# Patient Record
Sex: Female | Born: 1990 | Race: Black or African American | Hispanic: No | Marital: Single | Smoking: Current every day smoker
Health system: Southern US, Community
[De-identification: ages and names within clinical notes are randomized; demographics above are authoritative.]

## PROBLEM LIST (undated history)

## (undated) ENCOUNTER — Inpatient Hospital Stay (HOSPITAL_COMMUNITY): Payer: Self-pay

## (undated) ENCOUNTER — Emergency Department (HOSPITAL_COMMUNITY): Discharge status: Absent without leave | Payer: Self-pay

## (undated) DIAGNOSIS — R51 Headache: Secondary | ICD-10-CM

## (undated) DIAGNOSIS — I10 Essential (primary) hypertension: Secondary | ICD-10-CM

## (undated) DIAGNOSIS — R87613 High grade squamous intraepithelial lesion on cytologic smear of cervix (HGSIL): Secondary | ICD-10-CM

## (undated) DIAGNOSIS — E669 Obesity, unspecified: Secondary | ICD-10-CM

## (undated) HISTORY — DX: High grade squamous intraepithelial lesion on cytologic smear of cervix (HGSIL): R87.613

## (undated) HISTORY — PX: OTHER SURGICAL HISTORY: SHX169

---
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
## 2008-02-07 ENCOUNTER — Emergency Department (HOSPITAL_COMMUNITY): Admission: EM | Admit: 2008-02-07 | Discharge: 2008-02-07 | Payer: Self-pay | Admitting: Emergency Medicine

## 2008-02-11 ENCOUNTER — Inpatient Hospital Stay (HOSPITAL_COMMUNITY): Admission: EM | Admit: 2008-02-11 | Discharge: 2008-02-16 | Payer: Self-pay | Admitting: Emergency Medicine

## 2008-02-22 ENCOUNTER — Ambulatory Visit: Payer: Self-pay | Admitting: Internal Medicine

## 2008-08-26 ENCOUNTER — Emergency Department (HOSPITAL_COMMUNITY): Admission: EM | Admit: 2008-08-26 | Discharge: 2008-08-26 | Payer: Self-pay | Admitting: Emergency Medicine

## 2008-11-13 ENCOUNTER — Inpatient Hospital Stay (HOSPITAL_COMMUNITY): Admission: AD | Admit: 2008-11-13 | Discharge: 2008-11-13 | Payer: Self-pay | Admitting: Obstetrics

## 2009-01-08 ENCOUNTER — Ambulatory Visit (HOSPITAL_COMMUNITY): Admission: RE | Admit: 2009-01-08 | Discharge: 2009-01-08 | Payer: Self-pay | Admitting: Obstetrics

## 2009-01-25 ENCOUNTER — Ambulatory Visit: Payer: Self-pay | Admitting: Family

## 2009-01-25 ENCOUNTER — Inpatient Hospital Stay (HOSPITAL_COMMUNITY): Admission: AD | Admit: 2009-01-25 | Discharge: 2009-01-25 | Payer: Self-pay | Admitting: Obstetrics

## 2009-03-06 ENCOUNTER — Inpatient Hospital Stay (HOSPITAL_COMMUNITY): Admission: AD | Admit: 2009-03-06 | Discharge: 2009-03-06 | Payer: Self-pay | Admitting: Obstetrics

## 2009-04-11 ENCOUNTER — Inpatient Hospital Stay (HOSPITAL_COMMUNITY): Admission: AD | Admit: 2009-04-11 | Discharge: 2009-04-11 | Payer: Self-pay | Admitting: Obstetrics

## 2009-04-17 ENCOUNTER — Inpatient Hospital Stay (HOSPITAL_COMMUNITY): Admission: AD | Admit: 2009-04-17 | Discharge: 2009-04-17 | Payer: Self-pay | Admitting: Obstetrics & Gynecology

## 2009-06-09 ENCOUNTER — Inpatient Hospital Stay (HOSPITAL_COMMUNITY): Admission: AD | Admit: 2009-06-09 | Discharge: 2009-06-09 | Payer: Self-pay | Admitting: Obstetrics & Gynecology

## 2009-06-10 ENCOUNTER — Inpatient Hospital Stay (HOSPITAL_COMMUNITY): Admission: AD | Admit: 2009-06-10 | Discharge: 2009-06-14 | Payer: Self-pay | Admitting: Obstetrics

## 2009-06-21 ENCOUNTER — Emergency Department (HOSPITAL_COMMUNITY): Admission: EM | Admit: 2009-06-21 | Discharge: 2009-06-21 | Payer: Self-pay | Admitting: Emergency Medicine

## 2009-06-27 ENCOUNTER — Emergency Department (HOSPITAL_COMMUNITY): Admission: EM | Admit: 2009-06-27 | Discharge: 2009-06-28 | Payer: Self-pay | Admitting: Emergency Medicine

## 2009-11-13 ENCOUNTER — Emergency Department (HOSPITAL_COMMUNITY): Admission: EM | Admit: 2009-11-13 | Discharge: 2009-11-13 | Payer: Self-pay | Admitting: Emergency Medicine

## 2009-11-26 ENCOUNTER — Emergency Department (HOSPITAL_COMMUNITY): Admission: EM | Admit: 2009-11-26 | Discharge: 2009-11-26 | Payer: Self-pay | Admitting: Emergency Medicine

## 2010-02-08 ENCOUNTER — Emergency Department (HOSPITAL_COMMUNITY): Admission: EM | Admit: 2010-02-08 | Discharge: 2010-02-09 | Payer: Self-pay | Admitting: Emergency Medicine

## 2010-04-05 ENCOUNTER — Emergency Department (HOSPITAL_COMMUNITY): Admission: EM | Admit: 2010-04-05 | Discharge: 2010-04-05 | Payer: Self-pay | Admitting: Emergency Medicine

## 2010-04-08 ENCOUNTER — Emergency Department (HOSPITAL_COMMUNITY)
Admission: EM | Admit: 2010-04-08 | Discharge: 2010-04-08 | Payer: Self-pay | Source: Home / Self Care | Admitting: Emergency Medicine

## 2010-07-26 LAB — RAPID STREP SCREEN (MED CTR MEBANE ONLY): Streptococcus, Group A Screen (Direct): NEGATIVE

## 2010-07-27 LAB — URINALYSIS, ROUTINE W REFLEX MICROSCOPIC
Glucose, UA: NEGATIVE mg/dL
Ketones, ur: NEGATIVE mg/dL
Nitrite: NEGATIVE
Specific Gravity, Urine: 1.023 (ref 1.005–1.030)
pH: 7.5 (ref 5.0–8.0)

## 2010-07-27 LAB — CBC
HCT: 36.6 % (ref 36.0–46.0)
Hemoglobin: 12.4 g/dL (ref 12.0–15.0)
MCV: 86.9 fL (ref 78.0–100.0)
RDW: 13.8 % (ref 11.5–15.5)

## 2010-07-27 LAB — GC/CHLAMYDIA PROBE AMP, GENITAL
Chlamydia, DNA Probe: NEGATIVE
GC Probe Amp, Genital: NEGATIVE

## 2010-07-27 LAB — WET PREP, GENITAL: Yeast Wet Prep HPF POC: NONE SEEN

## 2010-07-27 LAB — POCT PREGNANCY, URINE: Preg Test, Ur: NEGATIVE

## 2010-07-30 LAB — CBC
Platelets: 154 10*3/uL (ref 150–400)
WBC: 18.9 10*3/uL — ABNORMAL HIGH (ref 4.0–10.5)

## 2010-07-30 LAB — URINE MICROSCOPIC-ADD ON

## 2010-07-30 LAB — POCT I-STAT, CHEM 8
Calcium, Ion: 1.08 mmol/L — ABNORMAL LOW (ref 1.12–1.32)
HCT: 35 % — ABNORMAL LOW (ref 36.0–46.0)
Sodium: 138 mEq/L (ref 135–145)
TCO2: 27 mmol/L (ref 0–100)

## 2010-07-30 LAB — URINALYSIS, ROUTINE W REFLEX MICROSCOPIC
Bilirubin Urine: NEGATIVE
Glucose, UA: NEGATIVE mg/dL
Protein, ur: NEGATIVE mg/dL

## 2010-08-12 LAB — URINALYSIS, ROUTINE W REFLEX MICROSCOPIC
Glucose, UA: NEGATIVE mg/dL
Nitrite: NEGATIVE
Protein, ur: NEGATIVE mg/dL
Specific Gravity, Urine: 1.02 (ref 1.005–1.030)
Specific Gravity, Urine: 1.025 (ref 1.005–1.030)
pH: 6 (ref 5.0–8.0)
pH: 7 (ref 5.0–8.0)

## 2010-08-12 LAB — URINE MICROSCOPIC-ADD ON

## 2010-08-12 LAB — WET PREP, GENITAL
Trich, Wet Prep: NONE SEEN
Yeast Wet Prep HPF POC: NONE SEEN

## 2010-08-14 LAB — URINALYSIS, ROUTINE W REFLEX MICROSCOPIC
Bilirubin Urine: NEGATIVE
Ketones, ur: NEGATIVE mg/dL
Nitrite: NEGATIVE
Protein, ur: NEGATIVE mg/dL
Specific Gravity, Urine: 1.025 (ref 1.005–1.030)
Urobilinogen, UA: 0.2 mg/dL (ref 0.0–1.0)

## 2010-08-14 LAB — DIFFERENTIAL
Basophils Absolute: 0.1 10*3/uL (ref 0.0–0.1)
Basophils Relative: 1 % (ref 0–1)
Eosinophils Relative: 1 % (ref 0–5)
Monocytes Absolute: 1.4 10*3/uL — ABNORMAL HIGH (ref 0.1–1.0)
Neutro Abs: 10.5 10*3/uL — ABNORMAL HIGH (ref 1.7–7.7)

## 2010-08-14 LAB — CBC
Hemoglobin: 11.9 g/dL — ABNORMAL LOW (ref 12.0–15.0)
MCHC: 33.5 g/dL (ref 30.0–36.0)
RDW: 13.3 % (ref 11.5–15.5)

## 2010-08-17 LAB — CBC
Hemoglobin: 12.9 g/dL (ref 12.0–15.0)
MCHC: 35.1 g/dL (ref 30.0–36.0)
MCV: 87 fL (ref 78.0–100.0)
RDW: 13.4 % (ref 11.5–15.5)

## 2010-08-17 LAB — URINALYSIS, ROUTINE W REFLEX MICROSCOPIC
Bilirubin Urine: NEGATIVE
Glucose, UA: NEGATIVE mg/dL
Ketones, ur: NEGATIVE mg/dL
Leukocytes, UA: NEGATIVE
Nitrite: NEGATIVE
Protein, ur: NEGATIVE mg/dL
Specific Gravity, Urine: 1.025 (ref 1.005–1.030)
Urobilinogen, UA: 0.2 mg/dL (ref 0.0–1.0)
pH: 6 (ref 5.0–8.0)

## 2010-08-17 LAB — WET PREP, GENITAL
Trich, Wet Prep: NONE SEEN
Yeast Wet Prep HPF POC: NONE SEEN

## 2010-08-17 LAB — GC/CHLAMYDIA PROBE AMP, GENITAL: Chlamydia, DNA Probe: NEGATIVE

## 2010-08-20 LAB — POCT URINALYSIS DIP (DEVICE)
Bilirubin Urine: NEGATIVE
Glucose, UA: NEGATIVE mg/dL
Ketones, ur: NEGATIVE mg/dL
pH: 7.5 (ref 5.0–8.0)

## 2010-09-17 ENCOUNTER — Emergency Department (HOSPITAL_COMMUNITY)
Admission: EM | Admit: 2010-09-17 | Discharge: 2010-09-18 | Disposition: A | Discharge status: Home / Self Care | Payer: Self-pay | Attending: Emergency Medicine | Admitting: Emergency Medicine

## 2010-09-17 DIAGNOSIS — R51 Headache: Secondary | ICD-10-CM | POA: Insufficient documentation

## 2010-09-17 DIAGNOSIS — H538 Other visual disturbances: Secondary | ICD-10-CM | POA: Insufficient documentation

## 2010-09-17 DIAGNOSIS — Y929 Unspecified place or not applicable: Secondary | ICD-10-CM | POA: Insufficient documentation

## 2010-09-17 DIAGNOSIS — IMO0002 Reserved for concepts with insufficient information to code with codable children: Secondary | ICD-10-CM | POA: Insufficient documentation

## 2010-09-17 DIAGNOSIS — S060X0A Concussion without loss of consciousness, initial encounter: Secondary | ICD-10-CM | POA: Insufficient documentation

## 2010-09-17 DIAGNOSIS — S0003XA Contusion of scalp, initial encounter: Secondary | ICD-10-CM | POA: Insufficient documentation

## 2010-09-23 NOTE — H&P (Signed)
NAMEGUILLERMO, Susan English                ACCOUNT NO.:  [id number]   MEDICAL RECORD NO.:  [id number]          PATIENT TYPE:  INP   LOCATION:  5114                         FACILITY:  [facility]   PHYSICIAN:  Jennelle Human. Marisue Humble, MD DATE OF BIRTH:  [birth year]   DATE OF ADMISSION:  02/11/2008  DATE OF DISCHARGE:                              HISTORY & PHYSICAL   PRIMARY CARE PHYSICIAN:  None.   CHIEF COMPLAINT:  Nausea, vomiting, and diarrhea.   HISTORY OF PRESENT ILLNESS:  This is a 20 year old lady who started to  have a sore throat approximately 10-11 days ago.  This was followed by  diffuse body pains, headache, and sharp pleuritic chest pains with deep  breaths.  This was soon followed by nausea and vomiting that was also  associated with diarrhea, which has been present for about 10 days now.  She has started to get fever and chills and feels as the progression of  her symptoms has worsened.  She went to an urgent care facility on  Tuesday and was given some meclizine and diagnosed with gastroenteritis.  Since last Tuesday, her appetite has decreased further.  She has had  more dizziness, especially upon standing.  She has been unable to keep  soup down without significant vomiting and is now to the point where she  cannot keep anything down.   PAST MEDICAL HISTORY:  None.   PAST SURGICAL HISTORY:  None.   ALLERGIES:  No known drug allergies.   MEDICATIONS:  None.   REVIEW OF SYSTEMS:  Negative x10 except for that stated in the HPI.   SOCIAL HISTORY:  She has currently a Consulting civil engineer at [employer], [place] [employer].  She  lives with her mom.  She does smoke tobacco, but no ethanol or other  illicit drug use.   FAMILY HISTORY:  Significant for congestive heart failure in the older  members of the family, late onset adult type 2 diabetes mellitus, and  hypertension.  There is no history of early heart disease and her  sibling is healthy.   PHYSICAL EXAMINATION:  VITAL SIGNS:  Her temperature was 98.5  degrees.  Her blood pressure 102/68 with a heart rate of 119.  She is satting 100%  on room air.  HEENT:  Tacky mucous membranes, but is otherwise normal.  NECK:  Supple without lymphadenopathy or masses.  There sunken jugular  venous pressures.  CHEST:  There are bilateral basilar wheezes and crackles.  HEART:  Regular rate and rhythm, although tachy without murmurs,  gallops, or rubs.  ABDOMEN:  Soft, tender in the epigastrium.  No rebounding or guarding is  noted.  Bowel sounds are hyperactive.  There is no CVA tenderness noted.  EXTREMITIES:  No clubbing, cyanosis, or edema is noted.  She has 2+  pulses.  Her capillary refill is delayed at approximately 3-4 seconds.   Chest x-ray shows bilateral lower lobe infiltrates.   LABORATORY DATA:  White count of 25.6 with a left shift.  Sodium is 138,  potassium is 3.6, chloride is 106, BUN and creatinine is 08 and 1  respectively.  Glucose is 83.  Urinalysis shows many epithelial cells,  which is consistent with a contaminated specimen and she is currently on  her period.  White blood cells 3-6, red blood cells 7-10, leukocyte  esterase is positive, and nitrite is positive.  Urine pregnancy test is  negative.   ASSESSMENT/PLAN:  1. Pneumonia.  This is likely postviral in nature.  She likely had a      viral illness and now has a secondary bacterial infection.  Chest x-      ray shows bilateral lower lobe infiltrates.  Blood cultures x2 have      been drawn and IV fluids started.  She is being treated for      community-acquired pneumonia and has received 1 g of Rocephin IV      and 500 mg of azithromycin IV.  She does have some wheezing and      therefore we will give her p.r.n. albuterol treatments.  2. Nausea - vomiting - diarrhea.  We have given IV fluids.  She was      significantly dehydrated when she came in.  We will treat her      nausea and vomiting with Phenergan as needed and will start her on      a full liquid diet and  advance as tolerated.  3. Dehydration with tachycardia and orthostasis.  We will treat with      copious IV fluids.  She appears significantly dehydrated by exam as      her mucous membranes are tacky and her capillary refill is 3-4      seconds with a heart rate in the 120s-130s on arrival.  Her lab      exam was not too impressive for dehydration with a BUN to      creatinine ratio of only 8.  However, her physical exam was pretty      remarkable.  We will treat underlying infectious etiology and      continue to hydrate.  4. Possible urinary tract infection.  Although it is a contaminated      specimen and she is on her period and has multiple epithelial cells      and there is leukocyte esterase, bacteria, and nitrites.  Thus, she      will be covered for urinary tract infection with the aforementioned      antibiotics.  5. Tobacco abuse.  There was approximately 5 minutes spent on smoking      cessation.  As she is young, this would be easier for her to quit      now before she has many years of addiction.      Jennelle Human Marisue Humble, MD  Electronically Signed     GBS/MEDQ  D:  02/11/2008  T:  02/11/2008  Job:  [id number]

## 2010-09-23 NOTE — Discharge Summary (Signed)
NAMEMONICIA, TSE                ACCOUNT NO.:  [id number]   MEDICAL RECORD NO.:  [id number]          PATIENT TYPE:  INP   LOCATION:  5114                         FACILITY:  [facility]   PHYSICIAN:  Herbie Saxon, MDDATE OF BIRTH:  06/22/1990   DATE OF ADMISSION:  02/11/2008  DATE OF DISCHARGE:  02/16/2008                               DISCHARGE SUMMARY   DISCHARGE DIAGNOSES:  1. Bilateral recent pneumonia, clinically improving.  2. Urinary tract infection, improved.  3. Dehydration, resolved.  4. Thrombocytopenia, resolved.  5. Hypokalemia, repleted.  6. Tobacco abuse.   RADIOLOGY:  The chest x-ray on admission showed bilateral infiltrates.  Repeat chest x-ray on February 14, 2008, showed the bilateral patchy lower  lobe infiltrate again without significant change.  No pleural effusion.   HOSPITAL COURSE:  This 20 year old African American lady presented with  sore throat of 10 days' duration, associated nausea and vomiting and  rare fevers and chills and anorexia.  At presentation, the patient was  dehydrated, orthostatic tachycardic, and had hypokalemia which was  improved with IV fluid hydration.  The patient was started on IV  Rocephin and Zithromax with p.r.n. bronchodilator treatment.  A  leukocyte of 25,000 at presentation, this is resolved.  Also, noticed to  be having thrombocytopenia likely secondary to the sepsis, but this is  also resolved.  The patient is clinically improved and has been afebrile  in the last 48 hours.  She has been consulted on the need to totally  freeze smoking cigarettes.   DISCHARGE CONDITION:  Stable.   DIET:  Regular.   ACTIVITY:  To be increased slowly as tolerated.   FOLLOWUP:  The patient is to follow up at [facility] in the next 5-7  days as she is currently has no medical insurance, have repeat chest x-  ray in 1 week.   DISCHARGE MEDICATIONS:  1. Levaquin 500 mg daily for 1 week.  2. Robitussin 10 mL q.6 h. p.r.n.  3.  Ibuprofen 400 mg q.8 h. p.r.n.  4. Tylenol 650 mg q.6 h. p.r.n. for fever.  5. Chloraseptic 1 spray twice daily.   PHYSICAL EXAMINATION:  GENERAL:  She is a young lady not in acute  distress  Vital Signs stable    Dictation ended at this point.      Herbie Saxon, MD  Electronically Signed     MIO/MEDQ  D:  02/16/2008  T:  02/16/2008  Job:  [id number]

## 2010-09-26 NOTE — Op Note (Signed)
Susan English, Susan English                ACCOUNT NO.:  [id number]   MEDICAL RECORD NO.:  [id number]          PATIENT TYPE:  AMB   LOCATION:  DSC                          FACILITY:  [facility]   PHYSICIAN:  Earvin Hansen L. Shon Hough, M.D.DATE OF BIRTH:  10-Sep-1990   DATE OF PROCEDURE:  DATE OF DISCHARGE:                               OPERATIVE REPORT   HISTORY:  A 20 year old with severe hidradenitis suppurativa involving  the right axillary area.  Increased pain, discomfort.  She has had  increased I&Ds of the areas in the past.  We are having a quiet period  now and she is now being prepared for excision of the hidradenitis  suppurative with a Ryan-Pollock closure.   ANESTHESIA:  General.   Preoperatively, the patient was stood up and drawn for all hair bearing  area involving the right axillary area in elliptical fashion, within the  axillary crease lines.  She then, underwent general anesthesia,  intubated orally.  Prep was done to the right arm areas, axillary breast  areas with Hibiclens soap and solution and walled off with sterile  towels and draped so as to make a sterile field.  1/2% Xylocaine with  epinephrine 1:200,000 concentration was injected locally, a total of 50  mL after this and awaiting for vasoconstriction to take place while the  incision is made hidradenitis down to the superficial fascia.  After  appropriate hemostasis using the Bovie anticoagulation the flaps are  freed out approximately 3 cm for each side.  They were then brought  together in a rotational fashion and with 2-0 Monocryl x 2 layers  attaching to the deep fascia.  Subcutaneous close was done with 3-0  Monocryl, subdermal 5-0 Monocryl, then a running subcuticular of 3-0  Monocryl.  Flaps came back together very nicely.  They were cleaned.  Steri-Strips and soft dressing applied using 1/2-inch Steri-Strips,  xeroform gauze, 4 X 4's, ABD's and hyperfixed tape.  She went to  recovery in excellent condition.   ESTIMATED BLOOD LOSS:  Less than 50 mL.   COMPLICATIONS:  None.      Yaakov Guthrie. Shon Hough, M.D.  Electronically Signed     GLT/MEDQ  D:  03/15/2006  T:  03/15/2006  Job:  [id number]

## 2010-11-29 ENCOUNTER — Emergency Department (HOSPITAL_COMMUNITY)
Admission: EM | Admit: 2010-11-29 | Discharge: 2010-11-29 | Disposition: A | Discharge status: Home / Self Care | Payer: Medicaid Other | Attending: Emergency Medicine | Admitting: Emergency Medicine

## 2010-11-29 DIAGNOSIS — S335XXA Sprain of ligaments of lumbar spine, initial encounter: Secondary | ICD-10-CM | POA: Insufficient documentation

## 2010-11-29 DIAGNOSIS — M545 Low back pain, unspecified: Secondary | ICD-10-CM | POA: Insufficient documentation

## 2010-11-29 DIAGNOSIS — A499 Bacterial infection, unspecified: Secondary | ICD-10-CM | POA: Insufficient documentation

## 2010-11-29 DIAGNOSIS — M25579 Pain in unspecified ankle and joints of unspecified foot: Secondary | ICD-10-CM | POA: Insufficient documentation

## 2010-11-29 DIAGNOSIS — X58XXXA Exposure to other specified factors, initial encounter: Secondary | ICD-10-CM | POA: Insufficient documentation

## 2010-11-29 DIAGNOSIS — E669 Obesity, unspecified: Secondary | ICD-10-CM | POA: Insufficient documentation

## 2010-11-29 DIAGNOSIS — B9689 Other specified bacterial agents as the cause of diseases classified elsewhere: Secondary | ICD-10-CM | POA: Insufficient documentation

## 2010-11-29 DIAGNOSIS — N760 Acute vaginitis: Secondary | ICD-10-CM | POA: Insufficient documentation

## 2010-11-29 LAB — WET PREP, GENITAL: Trich, Wet Prep: NONE SEEN

## 2010-11-29 LAB — URINE MICROSCOPIC-ADD ON

## 2010-11-29 LAB — URINALYSIS, ROUTINE W REFLEX MICROSCOPIC
Glucose, UA: NEGATIVE mg/dL
Ketones, ur: NEGATIVE mg/dL
Leukocytes, UA: NEGATIVE
Nitrite: NEGATIVE
Protein, ur: NEGATIVE mg/dL
Urobilinogen, UA: 1 mg/dL (ref 0.0–1.0)

## 2010-11-29 LAB — POCT PREGNANCY, URINE: Preg Test, Ur: NEGATIVE

## 2010-12-01 LAB — GC/CHLAMYDIA PROBE AMP, GENITAL
Chlamydia, DNA Probe: NEGATIVE
GC Probe Amp, Genital: NEGATIVE

## 2011-01-03 ENCOUNTER — Emergency Department (HOSPITAL_COMMUNITY)
Admission: EM | Admit: 2011-01-03 | Discharge: 2011-01-04 | Disposition: A | Discharge status: Home / Self Care | Payer: Medicaid Other | Attending: Emergency Medicine | Admitting: Emergency Medicine

## 2011-01-03 DIAGNOSIS — E669 Obesity, unspecified: Secondary | ICD-10-CM | POA: Insufficient documentation

## 2011-01-03 DIAGNOSIS — R0789 Other chest pain: Secondary | ICD-10-CM | POA: Insufficient documentation

## 2011-01-03 DIAGNOSIS — M546 Pain in thoracic spine: Secondary | ICD-10-CM | POA: Insufficient documentation

## 2011-01-04 ENCOUNTER — Emergency Department (HOSPITAL_COMMUNITY): Payer: Medicaid Other

## 2011-01-04 LAB — DIFFERENTIAL
Basophils Absolute: 0.1 10*3/uL (ref 0.0–0.1)
Basophils Relative: 1 % (ref 0–1)
Eosinophils Absolute: 0.3 10*3/uL (ref 0.0–0.7)
Monocytes Relative: 15 % — ABNORMAL HIGH (ref 3–12)
Neutro Abs: 5 10*3/uL (ref 1.7–7.7)
Neutrophils Relative %: 47 % (ref 43–77)

## 2011-01-04 LAB — CBC
Hemoglobin: 12 g/dL (ref 12.0–15.0)
MCH: 27.6 pg (ref 26.0–34.0)
Platelets: 295 10*3/uL (ref 150–400)
RBC: 4.35 MIL/uL (ref 3.87–5.11)

## 2011-01-04 LAB — POCT I-STAT, CHEM 8
Calcium, Ion: 1.25 mmol/L (ref 1.12–1.32)
HCT: 39 % (ref 36.0–46.0)
Hemoglobin: 13.3 g/dL (ref 12.0–15.0)
Sodium: 140 mEq/L (ref 135–145)
TCO2: 24 mmol/L (ref 0–100)

## 2011-01-04 LAB — D-DIMER, QUANTITATIVE: D-Dimer, Quant: 0.39 ug/mL-FEU (ref 0.00–0.48)

## 2011-02-02 LAB — POCT PREGNANCY, URINE: Preg Test, Ur: NEGATIVE

## 2011-02-06 ENCOUNTER — Inpatient Hospital Stay (INDEPENDENT_AMBULATORY_CARE_PROVIDER_SITE_OTHER)
Admission: RE | Admit: 2011-02-06 | Discharge: 2011-02-06 | Disposition: A | Discharge status: Home / Self Care | Payer: Medicaid Other | Source: Ambulatory Visit | Attending: Family Medicine | Admitting: Family Medicine

## 2011-02-06 DIAGNOSIS — L210 Seborrhea capitis: Secondary | ICD-10-CM

## 2011-02-06 DIAGNOSIS — B354 Tinea corporis: Secondary | ICD-10-CM

## 2011-02-06 DIAGNOSIS — L259 Unspecified contact dermatitis, unspecified cause: Secondary | ICD-10-CM

## 2011-02-09 LAB — COMPREHENSIVE METABOLIC PANEL
ALT: 14
AST: 16
Albumin: 2.5 — ABNORMAL LOW
Alkaline Phosphatase: 83
Calcium: 7.7 — ABNORMAL LOW
GFR calc Af Amer: >60
Glucose, Bld: 103 — ABNORMAL HIGH
Potassium: 3.3 — ABNORMAL LOW
Sodium: 138
Total Protein: 6.1

## 2011-02-09 LAB — POCT I-STAT, CHEM 8
BUN: 8
Calcium, Ion: 1.14
Chloride: 106
Creatinine, Ser: 1
Glucose, Bld: 83
HCT: 44
Hemoglobin: 15
Potassium: 3.6
Sodium: 138
TCO2: 21

## 2011-02-09 LAB — URINE MICROSCOPIC-ADD ON

## 2011-02-09 LAB — URINALYSIS, ROUTINE W REFLEX MICROSCOPIC
Glucose, UA: NEGATIVE
Ketones, ur: 15 — AB
Protein, ur: >300 — AB
pH: 6

## 2011-02-09 LAB — DIFFERENTIAL
Basophils Absolute: 0
Lymphs Abs: 1.5
Monocytes Absolute: 1.8 — ABNORMAL HIGH
WBC Morphology: INCREASED

## 2011-02-09 LAB — CULTURE, BLOOD (ROUTINE X 2): Culture: NO GROWTH

## 2011-02-09 LAB — BASIC METABOLIC PANEL
BUN: 8
CO2: 23
Chloride: 114 — ABNORMAL HIGH
Glucose, Bld: 107 — ABNORMAL HIGH
Potassium: 3.6
Sodium: 142

## 2011-02-09 LAB — CBC
HCT: 32.7 — ABNORMAL LOW
Hemoglobin: 10.6 — ABNORMAL LOW
Hemoglobin: 11.1 — ABNORMAL LOW
MCV: 87.1
Platelets: 143 — ABNORMAL LOW
Platelets: 159
RBC: 4.96
RDW: 13.8
RDW: 14.1
WBC: 25.6 — ABNORMAL HIGH

## 2011-02-09 LAB — MAGNESIUM: Magnesium: 1.6

## 2011-02-09 LAB — PREGNANCY, URINE: Preg Test, Ur: NEGATIVE

## 2011-02-09 LAB — POCT PREGNANCY, URINE: Preg Test, Ur: NEGATIVE

## 2011-04-03 ENCOUNTER — Encounter: Payer: Self-pay | Admitting: Emergency Medicine

## 2011-04-03 ENCOUNTER — Emergency Department (INDEPENDENT_AMBULATORY_CARE_PROVIDER_SITE_OTHER)
Admission: EM | Admit: 2011-04-03 | Discharge: 2011-04-03 | Disposition: A | Discharge status: Home / Self Care | Payer: Medicaid Other | Source: Home / Self Care | Attending: Emergency Medicine | Admitting: Emergency Medicine

## 2011-04-03 DIAGNOSIS — L309 Dermatitis, unspecified: Secondary | ICD-10-CM

## 2011-04-03 DIAGNOSIS — L259 Unspecified contact dermatitis, unspecified cause: Secondary | ICD-10-CM

## 2011-04-03 MED ORDER — HYDROXYZINE HCL 25 MG PO TABS: 25.0000 mg | ORAL_TABLET | Freq: Four times a day (QID) | ORAL | Status: AC

## 2011-04-03 MED ORDER — TRIAMCINOLONE ACETONIDE 0.1 % EX OINT: TOPICAL_OINTMENT | Freq: Two times a day (BID) | CUTANEOUS | Status: DC

## 2011-04-03 MED ORDER — PREDNISONE 10 MG PO TABS: ORAL_TABLET | ORAL | Status: AC

## 2011-04-03 NOTE — ED Provider Notes (Signed)
History     CSN: [id number] Arrival date & time: 04/03/2011  8:56 PM   First MD Initiated Contact with Patient 04/03/11 2035      Chief Complaint  Patient presents with  . Rash    (Consider location/radiation/quality/duration/timing/severity/associated sxs/prior treatment) HPI Comments: Susan English has had a 4 to six-month history of a moderately pruritic generalized rash which has occurred on her arms, abdomen, legs, back, and face. She was here before in September and given steroids, cream, and antihistamine. The rash has not gotten any better and in fact has gotten worse. She's not been exposed to any thing or anyone with a similar rash. There are no specific contactants. 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 She denies any history of asthma, eczema, or allergic rhinitis.  Patient is a 20 y.o. female presenting with rash.  Rash     History reviewed. No pertinent past medical history.  Past Surgical History  Procedure Date  . Cesarean section   . Sweat gland     History reviewed. No pertinent family history.  History  Substance Use Topics  . Smoking status: Current Some Day Smoker  . Smokeless tobacco: Not on file  . Alcohol Use: No    OB History    Grav Para Term Preterm Abortions TAB SAB Ect Mult Living                  Review of Systems  Constitutional: Negative for fever and chills.  Skin: Positive for rash. Negative for color change, pallor and wound.    Allergies  Review of patient's allergies indicates no known allergies.  Home Medications   Current Outpatient Rx  Name Route Sig Dispense Refill  . IMPLANON [place] Subcutaneous Inject into the skin.      Marland Kitchen HYDROXYZINE HCL 25 MG PO TABS Oral Take 1 tablet (25 mg total) by mouth every 6 (six) hours. 60 tablet 0  . PREDNISONE 10 MG PO TABS  Take 4 tabs daily for 4 days, 3 tabs daily for 4 days, 2 tabs daily for 4 days, then 1 tab daily for 4 days.  Take all tabs at one time with food and  preferably in the morning except for the first dose. 40 tablet 0  . TRIAMCINOLONE ACETONIDE 0.1 % EX OINT Topical Apply topically 2 (two) times daily. 454 g 0    BP 121/69  Pulse 78  Temp(Src) 98.4 F (36.9 C) (Oral)  Resp 16  SpO2 100%  LMP 04/03/2011  Physical Exam  Nursing note and vitals reviewed. Constitutional: She appears well-developed and well-nourished. No distress.  Skin: Skin is warm and dry. Rash noted. No abrasion, no bruising, no ecchymosis and no lesion noted. She is not diaphoretic. No erythema. No pallor.       Exam reveals typical eczema rash on arms, antecubital fossas, lower back, abdomen, and popliteal fossas. She has hyperpigmentation, skin thickening, and lichenification.    ED Course  Procedures (including critical care time)  Labs Reviewed - No data to display No results found.   1. Eczema       MDM  She has a typical eczema rash. She'll need followup with a dermatologist. In the meantime she was given a cream, a by mouth steroid taper, and hydroxyzine for itching. She was given general instructions for skin care as well.        Roque Lias, MD 04/03/11 [phone]

## 2011-04-03 NOTE — ED Notes (Signed)
C/o rash for months.  Seen at [facility] in the past for the same.  Rash did go away, but came back.  Patient has patches on extremeties: blisters, very itchy,then dry scaly patches.  Patient reports it is on lower back as well.

## 2011-04-22 ENCOUNTER — Encounter (HOSPITAL_COMMUNITY): Payer: Self-pay | Admitting: *Deleted

## 2011-04-22 ENCOUNTER — Emergency Department (HOSPITAL_COMMUNITY): Payer: Medicaid Other

## 2011-04-22 ENCOUNTER — Emergency Department (HOSPITAL_COMMUNITY)
Admission: EM | Admit: 2011-04-22 | Discharge: 2011-04-22 | Disposition: A | Discharge status: Home / Self Care | Payer: Medicaid Other | Attending: Emergency Medicine | Admitting: Emergency Medicine

## 2011-04-22 DIAGNOSIS — J40 Bronchitis, not specified as acute or chronic: Secondary | ICD-10-CM | POA: Insufficient documentation

## 2011-04-22 DIAGNOSIS — R197 Diarrhea, unspecified: Secondary | ICD-10-CM | POA: Insufficient documentation

## 2011-04-22 DIAGNOSIS — J3489 Other specified disorders of nose and nasal sinuses: Secondary | ICD-10-CM | POA: Insufficient documentation

## 2011-04-22 DIAGNOSIS — R112 Nausea with vomiting, unspecified: Secondary | ICD-10-CM | POA: Insufficient documentation

## 2011-04-22 DIAGNOSIS — R072 Precordial pain: Secondary | ICD-10-CM | POA: Insufficient documentation

## 2011-04-22 DIAGNOSIS — R059 Cough, unspecified: Secondary | ICD-10-CM | POA: Insufficient documentation

## 2011-04-22 DIAGNOSIS — R509 Fever, unspecified: Secondary | ICD-10-CM | POA: Insufficient documentation

## 2011-04-22 DIAGNOSIS — R42 Dizziness and giddiness: Secondary | ICD-10-CM | POA: Insufficient documentation

## 2011-04-22 DIAGNOSIS — R05 Cough: Secondary | ICD-10-CM | POA: Insufficient documentation

## 2011-04-22 DIAGNOSIS — IMO0001 Reserved for inherently not codable concepts without codable children: Secondary | ICD-10-CM | POA: Insufficient documentation

## 2011-04-22 HISTORY — DX: Obesity, unspecified: E66.9

## 2011-04-22 MED ORDER — TRAMADOL HCL 50 MG PO TABS: 50.0000 mg | ORAL_TABLET | Freq: Four times a day (QID) | ORAL | Status: AC | PRN

## 2011-04-22 MED ORDER — ALBUTEROL SULFATE HFA 108 (90 BASE) MCG/ACT IN AERS: 1.0000 | INHALATION_SPRAY | Freq: Four times a day (QID) | RESPIRATORY_TRACT | Status: DC | PRN

## 2011-04-22 MED ORDER — KETOROLAC TROMETHAMINE 30 MG/ML IJ SOLN
30.0000 mg | Freq: Once | INTRAMUSCULAR | Status: AC
  Administered 2011-04-22: 30 mg via INTRAVENOUS
  Filled 2011-04-22: qty 1

## 2011-04-22 MED ORDER — GI COCKTAIL ~~LOC~~
30.0000 mL | Freq: Once | ORAL | Status: AC
  Administered 2011-04-22: 30 mL via ORAL
  Filled 2011-04-22: qty 30

## 2011-04-22 MED ORDER — METHYLPREDNISOLONE SODIUM SUCC 125 MG IJ SOLR
125.0000 mg | Freq: Once | INTRAMUSCULAR | Status: AC
  Administered 2011-04-22: 125 mg via INTRAVENOUS
  Filled 2011-04-22: qty 2

## 2011-04-22 MED ORDER — SODIUM CHLORIDE 0.9 % IV BOLUS (SEPSIS)
1000.0000 mL | Freq: Once | INTRAVENOUS | Status: AC
  Administered 2011-04-22: 1000 mL via INTRAVENOUS

## 2011-04-22 MED ORDER — ONDANSETRON HCL 4 MG/2ML IJ SOLN
4.0000 mg | Freq: Once | INTRAMUSCULAR | Status: AC
  Administered 2011-04-22: 4 mg via INTRAVENOUS
  Filled 2011-04-22: qty 2

## 2011-04-22 NOTE — ED Provider Notes (Addendum)
History     CSN: [id number] Arrival date & time: 04/22/2011  2:03 PM   First MD Initiated Contact with Patient 04/22/11 1613      Chief Complaint  Patient presents with  . Cough  . Fever  . Diarrhea    (Consider location/radiation/quality/duration/timing/severity/associated sxs/prior treatment) HPI The patient presents with 3 days of complaints. She notes the gradual onset of symptoms, with worsening since that time. She complains of cough, congestion, diffuse body aches, nausea, diarrhea, vomiting and lightheadedness. She has not achieved relief with anything, and symptoms seem to be worsening on their own. The patient describes her lightheadedness is worse when standing, minimally improved while sitting or prone. No syncope. She notes generalized discomfort though with increased chest pain about the sternum when coughing. No exertional pain no pleuritic pain. The patient notes a prior history of pneumonia, to which the symptoms seem similar Past Medical History  Diagnosis Date  . Obesity     Past Surgical History  Procedure Date  . Cesarean section   . Sweat gland     History reviewed. No pertinent family history.  History  Substance Use Topics  . Smoking status: Current Some Day Smoker  . Smokeless tobacco: Not on file  . Alcohol Use: No    OB History    Grav Para Term Preterm Abortions TAB SAB Ect Mult Living                  Review of Systems  Constitutional:       HPI  HENT:       HPI otherwise negative  Eyes: Negative.   Respiratory:       HPI, otherwise negative  Cardiovascular:       HPI, otherwise nmegative  Gastrointestinal: Positive for nausea, vomiting and diarrhea.  Genitourinary:       HPI, otherwise negative  Musculoskeletal:       HPI, otherwise negative  Skin: Negative.   Neurological: Negative for syncope.    Allergies  Review of patient's allergies indicates no known allergies.  Home Medications   Current Outpatient Rx  Name  Route Sig Dispense Refill  . ALBUTEROL SULFATE HFA 108 (90 BASE) MCG/ACT IN AERS Inhalation Inhale 2 puffs into the lungs every 6 (six) hours as needed. For shortness of breath     . IMPLANON [place] Subcutaneous Inject 1 application into the skin continuous.     Marland Kitchen PREDNISONE 10 MG PO TABS Oral Take 10 mg by mouth daily. Patient was on dose pack / has 4 days left before therapy is completed       BP 106/68  Pulse 114  Temp(Src) 99.8 F (37.7 C) (Oral)  Resp 20  SpO2 98%  LMP 04/03/2011  Physical Exam  Nursing note and vitals reviewed. Constitutional: She is oriented to person, place, and time. She appears well-developed and well-nourished.  HENT:  Head: Normocephalic and atraumatic.  Mouth/Throat: Oropharynx is clear and moist. No oropharyngeal exudate.  Eyes: EOM are normal.  Cardiovascular: Regular rhythm.  Tachycardia present.   Pulmonary/Chest: Effort normal and breath sounds normal.  Abdominal: She exhibits no distension.  Musculoskeletal: She exhibits no edema and no tenderness.  Neurological: She is alert and oriented to person, place, and time.  Skin: Skin is warm and dry.    ED Course  Procedures (including critical care time)  Labs Reviewed - No data to display No results found.   No diagnosis found.  CXR: bronchitis changes, reviewed by me.  MDM  This obese young female now presents with cough and congestion for several days. On exam the patient is a comfortable-appearing female in no distress. Initial vitals are notable for tachycardia. The patient has clear breath sounds but with her prior history of pneumonia she was evaluated with a chest x-ray which did not demonstrate focal infiltrate there was evidence of bronchitis. Given these findings the patient was discharged with an inhaler.  She is currently taking steroids and will be instructed to continue doing so. Though it is likely the patient has a concurrent URI, the absence of focal infiltrate makes a viral process  more likely she'll not receive antibiotics.        Gerhard Munch, MD 04/22/11 1610  Gerhard Munch, MD 04/22/11 [phone]

## 2011-04-22 NOTE — ED Notes (Signed)
Pt here with c/o upper back pain as well as nausea and diarrhea that started on Saturday and has become worse.  Pt rates pain 7/10.

## 2011-04-22 NOTE — ED Notes (Signed)
Reports n/v/d x 2 days with productive cough and fever. Mask on pt at triage.

## 2011-07-26 ENCOUNTER — Encounter (HOSPITAL_COMMUNITY): Payer: Self-pay | Admitting: *Deleted

## 2011-07-26 ENCOUNTER — Emergency Department (HOSPITAL_COMMUNITY)
Admission: EM | Admit: 2011-07-26 | Discharge: 2011-07-26 | Disposition: A | Discharge status: Home / Self Care | Payer: Medicaid Other | Attending: Emergency Medicine | Admitting: Emergency Medicine

## 2011-07-26 DIAGNOSIS — E669 Obesity, unspecified: Secondary | ICD-10-CM | POA: Insufficient documentation

## 2011-07-26 DIAGNOSIS — H6690 Otitis media, unspecified, unspecified ear: Secondary | ICD-10-CM

## 2011-07-26 DIAGNOSIS — J029 Acute pharyngitis, unspecified: Secondary | ICD-10-CM | POA: Insufficient documentation

## 2011-07-26 DIAGNOSIS — F17200 Nicotine dependence, unspecified, uncomplicated: Secondary | ICD-10-CM | POA: Insufficient documentation

## 2011-07-26 MED ORDER — IBUPROFEN 800 MG PO TABS
ORAL_TABLET | ORAL | Status: AC
  Administered 2011-07-26: 800 mg
  Filled 2011-07-26: qty 1

## 2011-07-26 MED ORDER — AMOXICILLIN 500 MG PO CAPS: 500.0000 mg | ORAL_CAPSULE | Freq: Three times a day (TID) | ORAL | Status: AC

## 2011-07-26 MED ORDER — AMOXICILLIN 500 MG PO CAPS
ORAL_CAPSULE | ORAL | Status: AC
  Administered 2011-07-26: 500 mg
  Filled 2011-07-26: qty 1

## 2011-07-26 NOTE — ED Notes (Signed)
Called pt again, no answer.

## 2011-07-26 NOTE — ED Notes (Signed)
Bilateral ear pain and throat pain x 12 days. Denies fevers

## 2011-07-26 NOTE — ED Provider Notes (Signed)
History     CSN: [id number]  Arrival date & time 07/26/11  0119   First MD Initiated Contact with Patient 07/26/11 0423      Chief Complaint  Patient presents with  . Otalgia  . Sore Throat     Patient is a 21 y.o. female presenting with ear pain. The history is provided by the patient.  Otalgia This is a new problem. The current episode started more than 2 days ago. There is pain in the left ear. The problem occurs constantly. The problem has been gradually worsening. There has been no fever. The pain is mild. Associated symptoms include sore throat and cough. Pertinent negatives include no diarrhea and no vomiting.  pt reports left ear pain for several days and reports sore throat for two weeks Also reports mild cough No vomiting No SOB She is able to take PO fluids No headache No neck stiffness No voice changes reported  Past Medical History  Diagnosis Date  . Obesity     Past Surgical History  Procedure Date  . Cesarean section   . Sweat gland     No family history on file.  History  Substance Use Topics  . Smoking status: Current Some Day Smoker  . Smokeless tobacco: Not on file  . Alcohol Use: No    OB History    Grav Para Term Preterm Abortions TAB SAB Ect Mult Living                  Review of Systems  HENT: Positive for ear pain and sore throat.   Respiratory: Positive for cough.   Gastrointestinal: Negative for vomiting and diarrhea.    Allergies  Review of patient's allergies indicates no known allergies.  Home Medications   Current Outpatient Rx  Name Route Sig Dispense Refill  . VITAMIN B-12 PO Oral Take 1 tablet by mouth daily.    . IMPLANON [place] Subcutaneous Inject 1 application into the skin continuous.     . FOLIC ACID 1 MG PO TABS Oral Take 1 mg by mouth daily.    . ADULT MULTIVITAMIN W/MINERALS CH Oral Take 1 tablet by mouth daily.    . AMOXICILLIN 500 MG PO CAPS Oral Take 1 capsule (500 mg total) by mouth 3 (three) times daily.  21 capsule 0    BP 123/59  Pulse 89  Temp(Src) 98.6 F (37 C) (Oral)  Resp 18  SpO2 100%  Physical Exam CONSTITUTIONAL: Well developed/well nourished HEAD AND FACE: Normocephalic/atraumatic EYES: EOMI/PERRL ENMT: Mucous membranes moist, uvula midline, pharynx normal.  Voice normal.  No anterior neck tenderness Left TM - mild erythema noted to TM, fluid noted behind TM with tenderness to palpation of ear, TM is intact Ears are symmetric NECK: supple no meningeal signs CV: S1/S2 noted, no murmurs/rubs/gallops noted LUNGS: Lungs are clear to auscultation bilaterally, no apparent distress ABDOMEN: soft, nontender, no rebound or guarding GU:no cva tenderness NEURO: Pt is awake/alert, moves all extremitiesx4 EXTREMITIES: pulses normal, full ROM SKIN: warm, color normal PSYCH: no abnormalities of mood noted  ED Course  Procedures    1. Otitis media   2. Pharyngitis       MDM  Nursing notes reviewed and considered in documentation         Joya Gaskins, MD 07/26/11 [phone]

## 2011-07-26 NOTE — ED Notes (Signed)
Called pt, no answer.

## 2011-07-26 NOTE — Discharge Instructions (Signed)
Otitis Media, Adult A middle ear infection is an infection in the space behind the eardrum. It often happens along with a cold. It is caused by a germ that starts growing in that space. Your neck may feel puffy (swollen) on the side of the ear infection. HOME CARE  Take your medicine as told. Finish it even if you start to feel better.   Nose medicine (nasal decongestant) may help the tube that connects the ear and throat (eustachian tube) drain better. It may also help with discomfort.   Follow up with your doctor in 10 to 14 days or as told by your doctor. This is to make sure the infection is gone.  GET HELP RIGHT AWAY IF:   You do not start to feel better in 2 to 3 days.   You have pain that is not helped with medicine.   You cannot use the medicine as told.   You feel worse instead of better.   You develop puffiness, redness, or pain around the ear.   You get a stiff neck.  MAKE SURE YOU:   Understand these instructions.   Will watch your condition.   Will get help right away if you are not doing well or get worse.  Document Released: 10/14/2007 Document Revised: 04/16/2011 Document Reviewed: 10/14/2007 [employer] Patient Information 2012 [employer]. 

## 2011-07-26 NOTE — ED Notes (Signed)
Patient with ear pain and sore throat pain, started a few days ago, getting worse this evening.

## 2011-10-08 ENCOUNTER — Emergency Department (HOSPITAL_COMMUNITY)
Admission: EM | Admit: 2011-10-08 | Discharge: 2011-10-08 | Disposition: A | Discharge status: Home / Self Care | Payer: Medicaid Other | Attending: Emergency Medicine | Admitting: Emergency Medicine

## 2011-10-08 ENCOUNTER — Encounter (HOSPITAL_COMMUNITY): Payer: Self-pay

## 2011-10-08 DIAGNOSIS — J039 Acute tonsillitis, unspecified: Secondary | ICD-10-CM

## 2011-10-08 DIAGNOSIS — F17200 Nicotine dependence, unspecified, uncomplicated: Secondary | ICD-10-CM | POA: Insufficient documentation

## 2011-10-08 DIAGNOSIS — R51 Headache: Secondary | ICD-10-CM | POA: Insufficient documentation

## 2011-10-08 DIAGNOSIS — E669 Obesity, unspecified: Secondary | ICD-10-CM | POA: Insufficient documentation

## 2011-10-08 DIAGNOSIS — H9209 Otalgia, unspecified ear: Secondary | ICD-10-CM | POA: Insufficient documentation

## 2011-10-08 MED ORDER — DEXAMETHASONE SODIUM PHOSPHATE 10 MG/ML IJ SOLN
10.0000 mg | Freq: Once | INTRAMUSCULAR | Status: AC
  Administered 2011-10-08: 10 mg via INTRAVENOUS
  Filled 2011-10-08: qty 1

## 2011-10-08 MED ORDER — OXYCODONE-ACETAMINOPHEN 5-325 MG PO TABS: 1.0000 | ORAL_TABLET | ORAL | Status: AC | PRN

## 2011-10-08 NOTE — ED Provider Notes (Signed)
History  This chart was scribed for Dione Booze, MD by Bennett Scrape. This patient was seen in room STRE4/STRE4 and the patient's care was started at 6:30PM.  CSN: [id number]  Arrival date & time 10/08/11  1620   First MD Initiated Contact with Patient 10/08/11 1830      Chief Complaint  Patient presents with  . Sore Throat     The history is provided by the patient. No language interpreter was used.    Susan English is a 21 y.o. female who presents to the Emergency Department complaining of one week of gradual onset, gradually worsening, constant sore throat that is worse on the right-side with associated right ear pain and HA in the left temple for 2 days. She reports that the pain is worse with swallowing but improved when she plugs her right ear. She rates the pain an 8 out of 10 currently. She has not taken any OTC medications to improve her symptoms. She denies fever, chills and diarrhea as associated symptoms. She has no h/o chronic medical conditions. She is a current some day smoker but denies alcohol use.  Past Medical History  Diagnosis Date  . Obesity     Past Surgical History  Procedure Date  . Cesarean section   . Sweat gland     No family history on file.  History  Substance Use Topics  . Smoking status: Current Some Day Smoker  . Smokeless tobacco: Not on file  . Alcohol Use: No     Review of Systems  Constitutional: Negative for fever and chills.  HENT: Positive for ear pain (right) and sore throat (right-sided).   Respiratory: Negative for shortness of breath.   Gastrointestinal: Negative for nausea and vomiting.  Neurological: Positive for headaches. Negative for weakness.    Allergies  Latex  Home Medications   Current Outpatient Rx  Name Route Sig Dispense Refill  . VITAMIN B-12 PO Oral Take 1 tablet by mouth daily.    . IMPLANON [place] Subcutaneous Inject 1 application into the skin continuous.     . FOLIC ACID 1 MG PO TABS Oral Take 1 mg  by mouth daily.    . ADULT MULTIVITAMIN W/MINERALS CH Oral Take 1 tablet by mouth daily.      Triage Vitals: BP 120/74  Pulse 78  Temp(Src) 98.1 F (36.7 C) (Oral)  Resp 17  SpO2 98%  LMP 09/21/2011  Physical Exam  Nursing note and vitals reviewed. Constitutional: She is oriented to person, place, and time. She appears well-developed and well-nourished. No distress.  HENT:  Head: Normocephalic and atraumatic.       TMs are clear bilaterally, Tonsils are moderate hypertrophic with exudate bilaterally   Eyes: EOM are normal.  Neck: Neck supple. No tracheal deviation present.  Cardiovascular: Normal rate.   Pulmonary/Chest: Effort normal. No respiratory distress.  Musculoskeletal: Normal range of motion.  Lymphadenopathy:    She has no cervical adenopathy.  Neurological: She is alert and oriented to person, place, and time.  Skin: Skin is warm and dry.  Psychiatric: She has a normal mood and affect. Her behavior is normal.    ED Course  Procedures (including critical care time)  DIAGNOSTIC STUDIES: Oxygen Saturation is 98% on room air, normal by my interpretation.    COORDINATION OF CARE: 6:35PM-Informed pt of negative strep throat test. Discussed discharge plan of steroid, pain medication and tylenol with pt and pt agreed to plan. Pt will be driving herself home.  Results for orders placed during the hospital encounter of 10/08/11  RAPID STREP SCREEN      Component Value Range   Streptococcus, Group A Screen (Direct) NEGATIVE  NEGATIVE      1. Tonsillitis       MDM  Tonsillitis-rule out streptococcal. Just screen will be obtained and she will be given a dose of dexamethasone.  Strep screen is negative, no antibiotics indicated. Patient is advised of this and sent home with prescription for Percocet for pain.  I personally performed the services described in this documentation, which was scribed in my presence. The recorded information has been reviewed and  considered.          Dione Booze, MD 10/11/11 [phone]

## 2011-10-08 NOTE — ED Notes (Signed)
Sore throat began on SAturday and has localized on her rt. side

## 2011-10-08 NOTE — Discharge Instructions (Signed)
Take Tylenol or ibuprofen as needed for less severe pain.  Viral Pharyngitis Viral pharyngitis is a viral infection that produces redness, pain, and swelling (inflammation) of the throat. It can spread from person to person (contagious). CAUSES Viral pharyngitis is caused by inhaling a large amount of certain germs called viruses. Many different viruses cause viral pharyngitis. SYMPTOMS Symptoms of viral pharyngitis include:  Sore throat.   Tiredness.   Stuffy nose.   Low-grade fever.   Congestion.   Cough.  TREATMENT Treatment includes rest, drinking plenty of fluids, and the use of over-the-counter medication (approved by your caregiver). HOME CARE INSTRUCTIONS   Drink enough fluids to keep your urine clear or pale yellow.   Eat soft, cold foods such as ice cream, frozen ice pops, or gelatin dessert.   Gargle with warm salt water (1 tsp salt per 1 qt of water).   If over age 59, throat lozenges may be used safely.   Only take over-the-counter or prescription medicines for pain, discomfort, or fever as directed by your caregiver. Do not take aspirin.  To help prevent spreading viral pharyngitis to others, avoid:  Mouth-to-mouth contact with others.   Sharing utensils for eating and drinking.   Coughing around others.  SEEK MEDICAL CARE IF:   You are better in a few days, then become worse.   You have a fever or pain not helped by pain medicines.   There are any other changes that concern you.  Document Released: 02/04/2005 Document Revised: 04/16/2011 Document Reviewed: 07/03/2010 [facility] Patient Information 2012 [place], [place].  Acetaminophen; Oxycodone tablets What is this medicine? ACETAMINOPHEN; OXYCODONE (a set a MEE noe fen; ox i KOE done) is a pain reliever. It is used to treat mild to moderate pain. This medicine may be used for other purposes; ask your health care provider or pharmacist if you have questions. What should I tell my health care provider  before I take this medicine? They need to know if you have any of these conditions: -brain tumor -Crohn's disease, inflammatory bowel disease, or ulcerative colitis -drink more than 3 alcohol containing drinks per day -drug abuse or addiction -head injury -heart or circulation problems -kidney disease or problems going to the bathroom -liver disease -lung disease, asthma, or breathing problems -an unusual or allergic reaction to acetaminophen, oxycodone, other opioid analgesics, other medicines, foods, dyes, or preservatives -pregnant or trying to get pregnant -breast-feeding How should I use this medicine? Take this medicine by mouth with a full glass of water. Follow the directions on the prescription label. Take your medicine at regular intervals. Do not take your medicine more often than directed. Talk to your pediatrician regarding the use of this medicine in children. Special care may be needed. Patients over 60 years old may have a stronger reaction and need a smaller dose. Overdosage: If you think you have taken too much of this medicine contact a poison control center or emergency room at once. NOTE: This medicine is only for you. Do not share this medicine with others. What if I miss a dose? If you miss a dose, take it as soon as you can. If it is almost time for your next dose, take only that dose. Do not take double or extra doses. What may interact with this medicine? -alcohol or medicines that contain alcohol -antihistamines -barbiturates like amobarbital, butalbital, butabarbital, methohexital, pentobarbital, phenobarbital, thiopental, and secobarbital -benztropine -drugs for bladder problems like solifenacin, trospium, oxybutynin, tolterodine, hyoscyamine, and methscopolamine -drugs for breathing problems  like ipratropium and tiotropium -drugs for certain stomach or intestine problems like propantheline, homatropine methylbromide, glycopyrrolate, atropine, belladonna, and  dicyclomine -general anesthetics like etomidate, ketamine, nitrous oxide, propofol, desflurane, enflurane, halothane, isoflurane, and sevoflurane -medicines for depression, anxiety, or psychotic disturbances -medicines for pain like codeine, morphine, pentazocine, buprenorphine, butorphanol, nalbuphine, tramadol, and propoxyphene -medicines for sleep -muscle relaxants -naltrexone -phenothiazines like perphenazine, thioridazine, chlorpromazine, mesoridazine, fluphenazine, prochlorperazine, promazine, and trifluoperazine -scopolamine -trihexyphenidyl This list may not describe all possible interactions. Give your health care provider a list of all the medicines, herbs, non-prescription drugs, or dietary supplements you use. Also tell them if you smoke, drink alcohol, or use illegal drugs. Some items may interact with your medicine. What should I watch for while using this medicine? Tell your doctor or health care professional if your pain does not go away, if it gets worse, or if you have new or a different type of pain. You may develop tolerance to the medicine. Tolerance means that you will need a higher dose of the medication for pain relief. Tolerance is normal and is expected if you take this medicine for a long time. Do not suddenly stop taking your medicine because you may develop a severe reaction. Your body becomes used to the medicine. This does NOT mean you are addicted. Addiction is a behavior related to getting and using a drug for a nonmedical reason. If you have pain, you have a medical reason to take pain medicine. Your doctor will tell you how much medicine to take. If your doctor wants you to stop the medicine, the dose will be slowly lowered over time to avoid any side effects. You may get drowsy or dizzy. Do not drive, use machinery, or do anything that needs mental alertness until you know how this medicine affects you. Do not stand or sit up quickly, especially if you are an older  patient. This reduces the risk of dizzy or fainting spells. Alcohol may interfere with the effect of this medicine. Avoid alcoholic drinks. The medicine will cause constipation. Try to have a bowel movement at least every 2 to 3 days. If you do not have a bowel movement for 3 days, call your doctor or health care professional. Do not take Tylenol (acetaminophen) or medicines that have acetaminophen with this medicine. Too much acetaminophen can be very dangerous. Many nonprescription medicines contain acetaminophen. Always read the labels carefully to avoid taking more acetaminophen. What side effects may I notice from receiving this medicine? Side effects that you should report to your doctor or health care professional as soon as possible: -allergic reactions like skin rash, itching or hives, swelling of the face, lips, or tongue -breathing difficulties, wheezing -confusion -light headedness or fainting spells -severe stomach pain -yellowing of the skin or the whites of the eyes Side effects that usually do not require medical attention (report to your doctor or health care professional if they continue or are bothersome): -dizziness -drowsiness -nausea -vomiting This list may not describe all possible side effects. Call your doctor for medical advice about side effects. You may report side effects to FDA at 1-800-FDA-1088. Where should I keep my medicine? Keep out of the reach of children. This medicine can be abused. Keep your medicine in a safe place to protect it from theft. Do not share this medicine with anyone. Selling or giving away this medicine is dangerous and against the law. Store at room temperature between 20 and 25 degrees C (68 and 77 degrees F). Keep container  tightly closed. Protect from light. Flush any unused medicines down the toilet. Do not use the medicine after the expiration date. NOTE: This sheet is a summary. It may not cover all possible information. If you have  questions about this medicine, talk to your doctor, pharmacist, or health care provider.  2012, [employer]. (03/26/2008 10:01:21 AM)

## 2012-02-12 ENCOUNTER — Emergency Department (HOSPITAL_COMMUNITY)
Admission: EM | Admit: 2012-02-12 | Discharge: 2012-02-13 | Disposition: A | Discharge status: Home / Self Care | Payer: Self-pay | Attending: Emergency Medicine | Admitting: Emergency Medicine

## 2012-02-12 ENCOUNTER — Encounter (HOSPITAL_COMMUNITY): Payer: Self-pay

## 2012-02-12 DIAGNOSIS — R51 Headache: Secondary | ICD-10-CM | POA: Insufficient documentation

## 2012-02-12 DIAGNOSIS — J449 Chronic obstructive pulmonary disease, unspecified: Secondary | ICD-10-CM | POA: Insufficient documentation

## 2012-02-12 DIAGNOSIS — Z79899 Other long term (current) drug therapy: Secondary | ICD-10-CM | POA: Insufficient documentation

## 2012-02-12 DIAGNOSIS — F17200 Nicotine dependence, unspecified, uncomplicated: Secondary | ICD-10-CM | POA: Insufficient documentation

## 2012-02-12 DIAGNOSIS — J4489 Other specified chronic obstructive pulmonary disease: Secondary | ICD-10-CM | POA: Insufficient documentation

## 2012-02-12 DIAGNOSIS — Z91040 Latex allergy status: Secondary | ICD-10-CM | POA: Insufficient documentation

## 2012-02-12 NOTE — ED Notes (Signed)
Pt c/o headache x1day. Pt c/o sensitivity to light and pt states "It hurts the most when I bend over." Denies n/v.

## 2012-02-13 MED ORDER — METOCLOPRAMIDE HCL 10 MG PO TABS: 10.0000 mg | ORAL_TABLET | Freq: Three times a day (TID) | ORAL | Status: DC | PRN

## 2012-02-13 MED ORDER — METOCLOPRAMIDE HCL 5 MG/ML IJ SOLN
10.0000 mg | Freq: Once | INTRAMUSCULAR | Status: AC
  Administered 2012-02-13: 10 mg via INTRAMUSCULAR
  Filled 2012-02-13: qty 2

## 2012-02-13 MED ORDER — KETOROLAC TROMETHAMINE 60 MG/2ML IM SOLN
60.0000 mg | Freq: Once | INTRAMUSCULAR | Status: AC
  Administered 2012-02-13: 60 mg via INTRAMUSCULAR
  Filled 2012-02-13: qty 2

## 2012-02-13 MED ORDER — NAPROXEN 500 MG PO TABS: 500.0000 mg | ORAL_TABLET | Freq: Two times a day (BID) | ORAL | Status: DC

## 2012-02-13 NOTE — ED Provider Notes (Signed)
History     CSN: [id number]  Arrival date & time 02/12/12  2201   First MD Initiated Contact with Patient 02/12/12 2359      Chief Complaint  Patient presents with  . Migraine    (Consider location/radiation/quality/duration/timing/severity/associated sxs/prior treatment) HPI Comments: 21 year old female who has a history of obesity, tobacco abuse and frequent headaches presents with a complaint of a recurrent headache. She states that her headache is located on the top of her head as well as behind her eyes bilaterally. The pain started approximately 20 hours ago, has been persistently waxes and wanes and is throbbing in quality. She denies any nausea or vomiting, admits to some photophobia. There has been no fevers chills stiff neck cough shortness of breath diarrhea dysuria swelling or rashes. She denies exposure to new chemicals, carbon monoxide, or head injuries. She states that she gets approximately 2 headaches per week. Today she has arty tried Tylenol, Naprosyn, codeine containing medicine that she has headache and toothache at home and a Goody powder. She states she got mild relief with a codeine containing medicine that the headache has come back. She states this headache was gradual in onset and similar to prior headaches. She has not been formally diagnosed with a headache disorder and has not seen a specialist.  Patient is a 21 y.o. female presenting with migraines. The history is provided by the patient and medical records.  Migraine    Past Medical History  Diagnosis Date  . Obesity   . COPD (chronic obstructive pulmonary disease)     Past Surgical History  Procedure Date  . Cesarean section   . Sweat gland     History reviewed. No pertinent family history.  History  Substance Use Topics  . Smoking status: Current Every Day Smoker -- 0.5 packs/day  . Smokeless tobacco: Not on file  . Alcohol Use: Yes     occ    OB History    Grav Para Term Preterm  Abortions TAB SAB Ect Mult Living                  Review of Systems  All other systems reviewed and are negative.    Allergies  Latex  Home Medications   Current Outpatient Rx  Name Route Sig Dispense Refill  . VITAMIN B-12 PO Oral Take 1 tablet by mouth daily.    . IMPLANON [place] Subcutaneous Inject 1 application into the skin continuous.     . FOLIC ACID 1 MG PO TABS Oral Take 1 mg by mouth daily.    . ADULT MULTIVITAMIN W/MINERALS CH Oral Take 1 tablet by mouth daily.    Marland Kitchen METOCLOPRAMIDE HCL 10 MG PO TABS Oral Take 1 tablet (10 mg total) by mouth 3 (three) times daily as needed (headache / nausea). 20 tablet 0  . NAPROXEN 500 MG PO TABS Oral Take 1 tablet (500 mg total) by mouth 2 (two) times daily with a meal. 30 tablet 0    BP 133/101  Pulse 85  Temp 98.6 F (37 C) (Oral)  Resp 16  SpO2 100%  LMP 01/13/2012  Physical Exam  Nursing note and vitals reviewed. Constitutional: She appears well-developed and well-nourished. No distress.  HENT:  Head: Normocephalic and atraumatic.  Mouth/Throat: Oropharynx is clear and moist. No oropharyngeal exudate.       Oropharynx is clear, mucous membranes are moist, nares are clear, no swollen turbinates were discharge. No sinus tenderness  Eyes: Conjunctivae normal and EOM are  normal. Pupils are equal, round, and reactive to light. Right eye exhibits no discharge. Left eye exhibits no discharge. No scleral icterus.  Neck: Normal range of motion. Neck supple. No JVD present. No thyromegaly present.  Cardiovascular: Normal rate, regular rhythm, normal heart sounds and intact distal pulses.  Exam reveals no gallop and no friction rub.   No murmur heard. Pulmonary/Chest: Effort normal and breath sounds normal. No respiratory distress. She has no wheezes. She has no rales.  Abdominal: Soft. Bowel sounds are normal. She exhibits no distension and no mass. There is no tenderness.  Musculoskeletal: Normal range of motion. She exhibits no  edema and no tenderness.  Lymphadenopathy:    She has no cervical adenopathy.  Neurological: She is alert. Coordination normal.       Normal speech, normal coordination, normal gait, normal strength of the bilateral upper and lower extremities, cranial nerves III through XII are intact, there is no diplopia and normal extraocular movements with a normal pupillary exam.  Skin: Skin is warm and dry. No rash noted. No erythema.  Psychiatric: She has a normal mood and affect. Her behavior is normal.    ED Course  Procedures (including critical care time)  Labs Reviewed - No data to display No results found.   1. Headache       MDM  The patient is well-appearing and has a normal neurologic exam without a fever or signs of sinusitis. We'll proceed with medications for headache as a cocktail, no signs of secondary headache including no signs of meningitis, tumor, carbon monoxide poisoning, patient counseled on smoking.    Symptoms improved after Toradol and Reglan, patient stable for discharge, return if needed, resource list given, home with Naprosyn and Reglan  Vida Roller, MD 02/13/12 [phone]

## 2012-02-13 NOTE — ED Notes (Signed)
Pt states she she normally gets migraines twice a week. Pt states that she normally just stays at home and then they go away but this migraine has lasted for the past day. Pt alert and oriented, pt able to follow commands and move extremities.

## 2012-05-21 ENCOUNTER — Encounter (HOSPITAL_COMMUNITY): Payer: Self-pay | Admitting: Emergency Medicine

## 2012-05-21 ENCOUNTER — Emergency Department (HOSPITAL_COMMUNITY): Payer: Self-pay

## 2012-05-21 ENCOUNTER — Emergency Department (HOSPITAL_COMMUNITY)
Admission: EM | Admit: 2012-05-21 | Discharge: 2012-05-21 | Disposition: A | Discharge status: Home / Self Care | Payer: Self-pay | Attending: Emergency Medicine | Admitting: Emergency Medicine

## 2012-05-21 DIAGNOSIS — F17200 Nicotine dependence, unspecified, uncomplicated: Secondary | ICD-10-CM | POA: Insufficient documentation

## 2012-05-21 DIAGNOSIS — J4489 Other specified chronic obstructive pulmonary disease: Secondary | ICD-10-CM | POA: Insufficient documentation

## 2012-05-21 DIAGNOSIS — J449 Chronic obstructive pulmonary disease, unspecified: Secondary | ICD-10-CM | POA: Insufficient documentation

## 2012-05-21 DIAGNOSIS — R222 Localized swelling, mass and lump, trunk: Secondary | ICD-10-CM

## 2012-05-21 DIAGNOSIS — E669 Obesity, unspecified: Secondary | ICD-10-CM | POA: Insufficient documentation

## 2012-05-21 DIAGNOSIS — R928 Other abnormal and inconclusive findings on diagnostic imaging of breast: Secondary | ICD-10-CM | POA: Insufficient documentation

## 2012-05-21 MED ORDER — IBUPROFEN 400 MG PO TABS
800.0000 mg | ORAL_TABLET | Freq: Once | ORAL | Status: AC
  Administered 2012-05-21: 800 mg via ORAL
  Filled 2012-05-21: qty 2

## 2012-05-21 NOTE — ED Notes (Signed)
Pt. Reports " knot" between breast with no reddness or drainage onset last Monday , denies injury.

## 2012-05-21 NOTE — ED Provider Notes (Signed)
History   This chart was scribed for non-physician practitioner working with Hurman Horn, MD by Smitty Pluck. This patient was seen in room TR10C/TR10C and the patient's care was started at 9:48 PM.   CSN: [id number]  Arrival date & time 05/21/12  1915      Chief Complaint  Patient presents with  . Breast Pain    (Consider location/radiation/quality/duration/timing/severity/associated sxs/prior treatment) The history is provided by the patient. No language interpreter was used.   Susan English is a 22 y.o. female who presents to the Emergency Department complaining of constant, mild chest wall pain between her breast  due to knot between breast onset 6 days ago. She reports that the area is tender to touch. She denies injury, fever, nausea, vomiting, discharge and any other pain. Pt uses implanon. She denies smoking cigarettes. pmh copd and obesity.  Past Medical History  Diagnosis Date  . Obesity   . COPD (chronic obstructive pulmonary disease)     Past Surgical History  Procedure Date  . Cesarean section   . Sweat gland     No family history on file.  History  Substance Use Topics  . Smoking status: Current Every Day Smoker -- 0.5 packs/day  . Smokeless tobacco: Not on file  . Alcohol Use: Yes     Comment: occ    OB History    Grav Para Term Preterm Abortions TAB SAB Ect Mult Living                  Review of Systems  Constitutional: Negative.  Negative for fever and chills.  HENT: Negative.   Eyes: Negative.   Respiratory: Negative.  Negative for shortness of breath.   Cardiovascular: Negative.   Gastrointestinal: Negative.  Negative for nausea and vomiting.  Skin:       Nodule between breasts  Neurological: Negative.  Negative for weakness.  Psychiatric/Behavioral: Negative.   All other systems reviewed and are negative.    Allergies  Latex  Home Medications   Current Outpatient Rx  Name  Route  Sig  Dispense  Refill  . ETONOGESTREL 68 MG  [place] IMPL   Subcutaneous   Inject 1 each into the skin once.           BP 136/84  Pulse 78  Temp 98.7 F (37.1 C) (Oral)  Resp 18  SpO2 98%  LMP 05/04/2012  Physical Exam  Nursing note and vitals reviewed. Constitutional: She is oriented to person, place, and time. She appears well-developed and well-nourished. No distress.  HENT:  Head: Normocephalic and atraumatic.  Eyes: Conjunctivae normal and EOM are normal. Pupils are equal, round, and reactive to light.  Neck: Normal range of motion. Neck supple. No tracheal deviation present.  Cardiovascular: Normal rate.   Pulmonary/Chest: Effort normal and breath sounds normal. No respiratory distress.  Abdominal: Soft.  Musculoskeletal: Normal range of motion. She exhibits no edema and no tenderness.  Neurological: She is alert and oriented to person, place, and time. She has normal reflexes.  Skin: Skin is warm and dry.       1cm mobile non fluctuant nodule between breasts on chest wall.  Psychiatric: She has a normal mood and affect. Her behavior is normal.    ED Course  Procedures (including critical care time) DIAGNOSTIC STUDIES: Oxygen Saturation is 98% on room air, normal by my interpretation.    COORDINATION OF CARE: 9:50 PM Discussed ED treatment with pt     Labs Reviewed -  No data to display Dg Chest 2 View  05/21/2012  *RADIOLOGY REPORT*  Clinical Data: Breast pain.  Smoker.  CHEST - 2 VIEW  Comparison: 04/22/2011  Findings: Heart and mediastinal contours are within normal limits. No focal opacities or effusions.  No acute bony abnormality.  IMPRESSION: No active cardiopulmonary disease.   Original Report Authenticated By: Charlett Nose, M.D.      No diagnosis found.    MDM  Non fluctuant 1cm tender mobile nodule between breasts x 6 days.  Hot compreses twice a day and squeeze.  Follow up with Dr. Margo Aye dermatologist.    I personally performed the services described in this documentation, which was scribed in  my presence. The recorded information has been reviewed and is accurate.     Remi Haggard, NP 05/22/12 1232

## 2012-05-27 NOTE — ED Provider Notes (Signed)
Medical screening examination/treatment/procedure(s) were performed by non-physician practitioner and as supervising physician I was immediately available for consultation/collaboration.   Mylo Choi M Gaile Allmon, MD 05/27/12 2148 

## 2012-12-17 ENCOUNTER — Emergency Department (HOSPITAL_COMMUNITY)
Admission: EM | Admit: 2012-12-17 | Discharge: 2012-12-17 | Disposition: A | Discharge status: Home / Self Care | Payer: Worker's Compensation | Attending: Emergency Medicine | Admitting: Emergency Medicine

## 2012-12-17 ENCOUNTER — Encounter (HOSPITAL_COMMUNITY): Payer: Self-pay | Admitting: Emergency Medicine

## 2012-12-17 ENCOUNTER — Emergency Department (HOSPITAL_COMMUNITY): Payer: Worker's Compensation

## 2012-12-17 DIAGNOSIS — E669 Obesity, unspecified: Secondary | ICD-10-CM | POA: Insufficient documentation

## 2012-12-17 DIAGNOSIS — W010XXA Fall on same level from slipping, tripping and stumbling without subsequent striking against object, initial encounter: Secondary | ICD-10-CM | POA: Insufficient documentation

## 2012-12-17 DIAGNOSIS — Y929 Unspecified place or not applicable: Secondary | ICD-10-CM | POA: Insufficient documentation

## 2012-12-17 DIAGNOSIS — Y9301 Activity, walking, marching and hiking: Secondary | ICD-10-CM | POA: Insufficient documentation

## 2012-12-17 DIAGNOSIS — J4489 Other specified chronic obstructive pulmonary disease: Secondary | ICD-10-CM | POA: Insufficient documentation

## 2012-12-17 DIAGNOSIS — J449 Chronic obstructive pulmonary disease, unspecified: Secondary | ICD-10-CM | POA: Insufficient documentation

## 2012-12-17 DIAGNOSIS — S82839A Other fracture of upper and lower end of unspecified fibula, initial encounter for closed fracture: Secondary | ICD-10-CM

## 2012-12-17 DIAGNOSIS — S82899A Other fracture of unspecified lower leg, initial encounter for closed fracture: Secondary | ICD-10-CM | POA: Insufficient documentation

## 2012-12-17 DIAGNOSIS — Z91040 Latex allergy status: Secondary | ICD-10-CM | POA: Insufficient documentation

## 2012-12-17 DIAGNOSIS — F17200 Nicotine dependence, unspecified, uncomplicated: Secondary | ICD-10-CM | POA: Insufficient documentation

## 2012-12-17 MED ORDER — OXYCODONE-ACETAMINOPHEN 5-325 MG PO TABS
1.0000 | ORAL_TABLET | Freq: Once | ORAL | Status: AC
  Administered 2012-12-17: 1 via ORAL
  Filled 2012-12-17: qty 1

## 2012-12-17 MED ORDER — IBUPROFEN 800 MG PO TABS: 800.0000 mg | ORAL_TABLET | Freq: Three times a day (TID) | ORAL | Status: DC

## 2012-12-17 MED ORDER — HYDROCODONE-ACETAMINOPHEN 5-325 MG PO TABS
1.0000 | ORAL_TABLET | Freq: Four times a day (QID) | ORAL | Status: DC | PRN
Start: 2012-12-17 — End: 2012-12-22

## 2012-12-17 NOTE — ED Notes (Signed)
Pt c/o right ankle pain after tripping and falling today; CMS intact

## 2012-12-17 NOTE — ED Notes (Signed)
Pt twisted right ankle this am when she tripped over a mat. C/o right ankle pain and swelling.

## 2012-12-17 NOTE — ED Provider Notes (Signed)
CSN: [id number]     Arrival date & time 12/17/12  1248 History     First MD Initiated Contact with Patient 12/17/12 1310     Chief Complaint  Patient presents with  . Ankle Pain   (Consider location/radiation/quality/duration/timing/severity/associated sxs/prior Treatment) Patient is a 22 y.o. female presenting with ankle pain. The history is provided by the patient. No language interpreter was used.  Ankle Pain Location:  Ankle Time since incident:  4 hours Injury: yes   Mechanism of injury comment:  Mechanical; slipped walking down wet slope Ankle location:  R ankle Pain details:    Quality:  Throbbing and sharp   Radiates to:  Does not radiate   Severity:  Mild   Onset quality:  Sudden   Timing:  Constant   Progression:  Waxing and waning Chronicity:  New Dislocation: no   Relieved by: ice and elevation. Worsened by:  Bearing weight (palpation) Ineffective treatments:  None tried Associated symptoms: decreased ROM and swelling   Associated symptoms: no fever, no muscle weakness, no numbness and no tingling   Risk factors: obesity   Risk factors: no known bone disorder     Past Medical History  Diagnosis Date  . Obesity   . COPD (chronic obstructive pulmonary disease)    Past Surgical History  Procedure Laterality Date  . Cesarean section    . Sweat gland     History reviewed. No pertinent family history. History  Substance Use Topics  . Smoking status: Current Every Day Smoker -- 0.50 packs/day  . Smokeless tobacco: Not on file  . Alcohol Use: Yes     Comment: occ   OB History   Grav Para Term Preterm Abortions TAB SAB Ect Mult Living                 Review of Systems  Constitutional: Negative for fever.  Musculoskeletal: Positive for joint swelling and arthralgias.  Skin: Negative for color change and pallor.  Neurological: Negative for weakness and numbness.  All other systems reviewed and are negative.   Allergies  Latex  Home Medications    Current Outpatient Rx  Name  Route  Sig  Dispense  Refill  . Chlorpheniramine Maleate (ALLERGY PO)   Oral   Take 1 tablet by mouth daily as needed (seasonal allergies).         Marland Kitchen etonogestrel (IMPLANON) 68 MG IMPL implant   Subcutaneous   Inject 1 each into the skin once.         . Naproxen Sodium (ALEVE PO)   Oral   Take 2 capsules by mouth 2 (two) times daily as needed (pain).         Marland Kitchen HYDROcodone-acetaminophen (NORCO/VICODIN) 5-325 MG per tablet   Oral   Take 1-2 tablets by mouth every 6 (six) hours as needed for pain.   25 tablet   0   . ibuprofen (ADVIL,MOTRIN) 800 MG tablet   Oral   Take 1 tablet (800 mg total) by mouth 3 (three) times daily.   21 tablet   0    BP 149/85  Pulse 79  Temp(Src) 98.7 F (37.1 C) (Oral)  Resp 16  SpO2 100%  LMP 09/22/2012  Physical Exam  Nursing note and vitals reviewed. Constitutional: She is oriented to person, place, and time. She appears well-developed and well-nourished. No distress.  HENT:  Head: Normocephalic and atraumatic.  Eyes: Conjunctivae and EOM are normal. No scleral icterus.  Neck: Normal range of motion.  Cardiovascular: Normal rate, regular rhythm and intact distal pulses.   DP and PT pulses 2+ bilaterally. Capillary refill normal.  Pulmonary/Chest: Effort normal. No respiratory distress.  Musculoskeletal:       Right ankle: She exhibits decreased range of motion (secondary to pain) and swelling. She exhibits no deformity and normal pulse. Tenderness. Lateral malleolus tenderness found. Achilles tendon normal.       Right lower leg: Normal.       Right foot: Normal.  Neurological: She is alert and oriented to person, place, and time.  No sensory or motor deficits appreciated  Skin: Skin is warm and dry. No rash noted. She is not diaphoretic. No erythema. No pallor.  Psychiatric: She has a normal mood and affect. Her behavior is normal.   ED Course   Procedures (including critical care time)  Labs  Reviewed - No data to display Dg Ankle Complete Right  12/17/2012   *RADIOLOGY REPORT*  Clinical Data: Trauma and pain.  RIGHT ANKLE - COMPLETE 3+ VIEW  Comparison: None.  Findings: Lateral greater than medial soft tissue swelling. Oblique fracture through the distal fibula, minimally displaced. Base of fifth metatarsal and talar dome intact.  IMPRESSION: Distal fibular fracture, with overlying soft tissue swelling.   Original Report Authenticated By: Jeronimo Greaves, M.D.   1. Fracture of distal fibula    MDM  Patient presents for pain to right ankle after mechanical slip down a wet slope. Patient neurovascularly intact in ED. No redness or heat-to-touch to suspect underlying cellulitic or infectious joint process. No pallor, pulselessness, poikilothermia, or paresthesias to suspect complicated injury. X-ray of ankle significant for distal fibular fracture. Patient put in a posterior and stirrup splint in ED and given crutches to use when ambulating. She is appropriate for discharge with orthopedic followup; referral provided. Prescription for Norco given for pain control. Indications for ED return discussed and patient agreeable to plan.    Antony Madura, PA-C 12/18/12 1744

## 2012-12-17 NOTE — Progress Notes (Signed)
Orthopedic Tech Progress Note Patient Details:  Susan English Sep 15, 1990 [id number]  Ortho Devices Type of Ortho Device: Crutches;Short leg splint;Ace wrap Ortho Device/Splint Interventions: Application   Cammer, Mickie Bail 12/17/2012, 2:36 PM

## 2012-12-19 NOTE — ED Provider Notes (Signed)
Medical screening examination/treatment/procedure(s) were performed by non-physician practitioner and as supervising physician I was immediately available for consultation/collaboration.  Omunique Pederson L Esias Mory, MD 12/19/12 1034 

## 2012-12-21 ENCOUNTER — Encounter (HOSPITAL_BASED_OUTPATIENT_CLINIC_OR_DEPARTMENT_OTHER): Payer: Self-pay | Admitting: *Deleted

## 2012-12-21 ENCOUNTER — Other Ambulatory Visit: Payer: Self-pay | Admitting: Orthopedic Surgery

## 2012-12-22 ENCOUNTER — Encounter (HOSPITAL_BASED_OUTPATIENT_CLINIC_OR_DEPARTMENT_OTHER)
Admission: RE | Disposition: A | Discharge status: Home / Self Care | Payer: Self-pay | Source: Ambulatory Visit | Attending: Orthopedic Surgery

## 2012-12-22 ENCOUNTER — Encounter (HOSPITAL_BASED_OUTPATIENT_CLINIC_OR_DEPARTMENT_OTHER): Payer: Self-pay | Admitting: *Deleted

## 2012-12-22 ENCOUNTER — Ambulatory Visit (HOSPITAL_BASED_OUTPATIENT_CLINIC_OR_DEPARTMENT_OTHER)
Admission: RE | Admit: 2012-12-22 | Discharge: 2012-12-22 | Disposition: A | Discharge status: Home / Self Care | Payer: Medicaid Other | Source: Ambulatory Visit | Attending: Orthopedic Surgery | Admitting: Orthopedic Surgery

## 2012-12-22 ENCOUNTER — Encounter (HOSPITAL_BASED_OUTPATIENT_CLINIC_OR_DEPARTMENT_OTHER): Payer: Self-pay | Admitting: Anesthesiology

## 2012-12-22 ENCOUNTER — Ambulatory Visit (HOSPITAL_BASED_OUTPATIENT_CLINIC_OR_DEPARTMENT_OTHER): Payer: Medicaid Other | Admitting: Anesthesiology

## 2012-12-22 DIAGNOSIS — Z79899 Other long term (current) drug therapy: Secondary | ICD-10-CM | POA: Insufficient documentation

## 2012-12-22 DIAGNOSIS — Z91040 Latex allergy status: Secondary | ICD-10-CM | POA: Insufficient documentation

## 2012-12-22 DIAGNOSIS — Z6841 Body Mass Index (BMI) 40.0 and over, adult: Secondary | ICD-10-CM | POA: Insufficient documentation

## 2012-12-22 DIAGNOSIS — W19XXXA Unspecified fall, initial encounter: Secondary | ICD-10-CM | POA: Insufficient documentation

## 2012-12-22 DIAGNOSIS — F17200 Nicotine dependence, unspecified, uncomplicated: Secondary | ICD-10-CM | POA: Insufficient documentation

## 2012-12-22 DIAGNOSIS — S82401D Unspecified fracture of shaft of right fibula, subsequent encounter for closed fracture with routine healing: Secondary | ICD-10-CM

## 2012-12-22 DIAGNOSIS — S8263XA Displaced fracture of lateral malleolus of unspecified fibula, initial encounter for closed fracture: Secondary | ICD-10-CM | POA: Insufficient documentation

## 2012-12-22 DIAGNOSIS — Y929 Unspecified place or not applicable: Secondary | ICD-10-CM | POA: Insufficient documentation

## 2012-12-22 HISTORY — PX: ORIF ANKLE FRACTURE: SHX5408

## 2012-12-22 HISTORY — DX: Headache: R51

## 2012-12-22 LAB — POCT HEMOGLOBIN-HEMACUE: Hemoglobin: 12.3 g/dL (ref 12.0–15.0)

## 2012-12-22 SURGERY — OPEN REDUCTION INTERNAL FIXATION (ORIF) ANKLE FRACTURE
Anesthesia: General | Site: Ankle | Laterality: Right | Wound class: Clean

## 2012-12-22 MED ORDER — 0.9 % SODIUM CHLORIDE (POUR BTL) OPTIME
TOPICAL | Status: DC | PRN
  Administered 2012-12-22: 1000 mL

## 2012-12-22 MED ORDER — DEXAMETHASONE SODIUM PHOSPHATE 4 MG/ML IJ SOLN
INTRAMUSCULAR | Status: DC | PRN
  Administered 2012-12-22: 8 mg via INTRAVENOUS
  Administered 2012-12-22: 4 mg

## 2012-12-22 MED ORDER — FENTANYL CITRATE 0.05 MG/ML IJ SOLN
INTRAMUSCULAR | Status: DC | PRN
  Administered 2012-12-22 (×2): 50 ug via INTRAVENOUS

## 2012-12-22 MED ORDER — ONDANSETRON HCL 4 MG/2ML IJ SOLN
INTRAMUSCULAR | Status: DC | PRN
  Administered 2012-12-22: 4 mg via INTRAVENOUS

## 2012-12-22 MED ORDER — OXYCODONE-ACETAMINOPHEN 5-325 MG PO TABS: 1.0000 | ORAL_TABLET | ORAL | Status: DC | PRN

## 2012-12-22 MED ORDER — HYDROMORPHONE HCL PF 1 MG/ML IJ SOLN
0.2500 mg | INTRAMUSCULAR | Status: DC | PRN
  Administered 2012-12-22: 0.5 mg via INTRAVENOUS

## 2012-12-22 MED ORDER — POVIDONE-IODINE 7.5 % EX SOLN: Freq: Once | CUTANEOUS | Status: DC

## 2012-12-22 MED ORDER — MIDAZOLAM HCL 5 MG/5ML IJ SOLN
INTRAMUSCULAR | Status: DC | PRN
  Administered 2012-12-22 (×2): 1 mg via INTRAVENOUS

## 2012-12-22 MED ORDER — MIDAZOLAM HCL 2 MG/2ML IJ SOLN
1.0000 mg | INTRAMUSCULAR | Status: DC | PRN
  Administered 2012-12-22: 2 mg via INTRAVENOUS

## 2012-12-22 MED ORDER — HYDROMORPHONE HCL PF 1 MG/ML IJ SOLN
0.2500 mg | INTRAMUSCULAR | Status: DC | PRN
  Administered 2012-12-22 (×5): 0.5 mg via INTRAVENOUS

## 2012-12-22 MED ORDER — LACTATED RINGERS IV SOLN
INTRAVENOUS | Status: DC
  Administered 2012-12-22 (×2): via INTRAVENOUS

## 2012-12-22 MED ORDER — OXYCODONE HCL 5 MG PO TABS
5.0000 mg | ORAL_TABLET | Freq: Once | ORAL | Status: AC | PRN
  Administered 2012-12-22: 5 mg via ORAL

## 2012-12-22 MED ORDER — CEFAZOLIN SODIUM-DEXTROSE 2-3 GM-% IV SOLR
2.0000 g | INTRAVENOUS | Status: AC
  Administered 2012-12-22: 3 g via INTRAVENOUS

## 2012-12-22 MED ORDER — ONDANSETRON HCL 4 MG/2ML IJ SOLN: 4.0000 mg | Freq: Once | INTRAMUSCULAR | Status: DC | PRN

## 2012-12-22 MED ORDER — LIDOCAINE HCL (CARDIAC) 20 MG/ML IV SOLN
INTRAVENOUS | Status: DC | PRN
  Administered 2012-12-22: 100 mg via INTRAVENOUS

## 2012-12-22 MED ORDER — HYDROMORPHONE HCL PF 1 MG/ML IJ SOLN: 0.5000 mg | INTRAMUSCULAR | Status: DC | PRN

## 2012-12-22 MED ORDER — PROPOFOL 10 MG/ML IV BOLUS
INTRAVENOUS | Status: DC | PRN
  Administered 2012-12-22: 300 mg via INTRAVENOUS

## 2012-12-22 MED ORDER — BUPIVACAINE-EPINEPHRINE PF 0.5-1:200000 % IJ SOLN
INTRAMUSCULAR | Status: DC | PRN
  Administered 2012-12-22: 30 mL

## 2012-12-22 MED ORDER — OXYCODONE HCL 5 MG/5ML PO SOLN: 5.0000 mg | Freq: Once | ORAL | Status: AC | PRN

## 2012-12-22 MED ORDER — FENTANYL CITRATE 0.05 MG/ML IJ SOLN
50.0000 ug | INTRAMUSCULAR | Status: DC | PRN
  Administered 2012-12-22: 100 ug via INTRAVENOUS

## 2012-12-22 SURGICAL SUPPLY — 80 items
APL SKNCLS STERI-STRIP NONHPOA (GAUZE/BANDAGES/DRESSINGS) ×1
BANDAGE ELASTIC 4 VELCRO ST LF (GAUZE/BANDAGES/DRESSINGS) ×2 IMPLANT
BANDAGE ELASTIC 6 VELCRO ST LF (GAUZE/BANDAGES/DRESSINGS) ×1 IMPLANT
BANDAGE ESMARK 6X9 LF (GAUZE/BANDAGES/DRESSINGS) ×1 IMPLANT
BENZOIN TINCTURE PRP APPL 2/3 (GAUZE/BANDAGES/DRESSINGS) ×2 IMPLANT
BIT DRILL QC 3.5X110 (BIT) ×1 IMPLANT
BIT DRILL SOLID 2.5X110 (BIT) ×2 IMPLANT
BLADE SURG 15 STRL LF DISP TIS (BLADE) ×2 IMPLANT
BLADE SURG 15 STRL SS (BLADE) ×4
BNDG CMPR 9X4 STRL LF SNTH (GAUZE/BANDAGES/DRESSINGS)
BNDG CMPR 9X6 STRL LF SNTH (GAUZE/BANDAGES/DRESSINGS) ×1
BNDG COHESIVE 4X5 TAN STRL (GAUZE/BANDAGES/DRESSINGS) ×2 IMPLANT
BNDG ESMARK 4X9 LF (GAUZE/BANDAGES/DRESSINGS) IMPLANT
BNDG ESMARK 6X9 LF (GAUZE/BANDAGES/DRESSINGS) ×2
CANISTER SUCTION 1200CC (MISCELLANEOUS) ×1 IMPLANT
CHLORAPREP W/TINT 26ML (MISCELLANEOUS) ×2 IMPLANT
CLOTH BEACON ORANGE TIMEOUT ST (SAFETY) ×2 IMPLANT
COVER TABLE BACK 60X90 (DRAPES) ×2 IMPLANT
DECANTER SPIKE VIAL GLASS SM (MISCELLANEOUS) IMPLANT
DRAPE EXTREMITY T 121X128X90 (DRAPE) ×2 IMPLANT
DRAPE OEC MINIVIEW 54X84 (DRAPES) ×2 IMPLANT
DRAPE U 20/CS (DRAPES) ×2 IMPLANT
DRAPE U-SHAPE 47X51 STRL (DRAPES) ×2 IMPLANT
DRSG EMULSION OIL 3X3 NADH (GAUZE/BANDAGES/DRESSINGS) ×1 IMPLANT
DRSG PAD ABDOMINAL 8X10 ST (GAUZE/BANDAGES/DRESSINGS) ×3 IMPLANT
ELECT REM PT RETURN 9FT ADLT (ELECTROSURGICAL) ×2
ELECTRODE REM PT RTRN 9FT ADLT (ELECTROSURGICAL) ×1 IMPLANT
GAUZE SPONGE 4X4 16PLY XRAY LF (GAUZE/BANDAGES/DRESSINGS) IMPLANT
GLOVE BIO SURGEON STRL SZ7 (GLOVE) ×1 IMPLANT
GLOVE BIO SURGEON STRL SZ7.5 (GLOVE) ×5 IMPLANT
GLOVE BIOGEL PI IND STRL 7.0 (GLOVE) ×1 IMPLANT
GLOVE BIOGEL PI IND STRL 8 (GLOVE) ×1 IMPLANT
GLOVE BIOGEL PI INDICATOR 7.0 (GLOVE) ×1
GLOVE BIOGEL PI INDICATOR 8 (GLOVE) ×1
GLOVE EPREMIER NITRL EXT CFF L (GLOVE) IMPLANT
GLOVE EXAM NITRILE EXT CFF LRG (GLOVE) ×2 IMPLANT
GLOVE SURG SS PI 7.0 STRL IVOR (GLOVE) ×1 IMPLANT
GLOVE SURG SS PI 7.5 STRL IVOR (GLOVE) ×2 IMPLANT
GOWN PREVENTION PLUS XLARGE (GOWN DISPOSABLE) ×3 IMPLANT
GOWN PREVENTION PLUS XXLARGE (GOWN DISPOSABLE) ×2 IMPLANT
KIT 1/3 TUB PL 7H 85M (Orthopedic Implant) ×1 IMPLANT
NEEDLE HYPO 22GX1.5 SAFETY (NEEDLE) IMPLANT
NS IRRIG 1000ML POUR BTL (IV SOLUTION) ×2 IMPLANT
PACK BASIN DAY SURGERY FS (CUSTOM PROCEDURE TRAY) ×2 IMPLANT
PAD CAST 4YDX4 CTTN HI CHSV (CAST SUPPLIES) ×1 IMPLANT
PADDING CAST ABS 4INX4YD NS (CAST SUPPLIES)
PADDING CAST ABS COTTON 4X4 ST (CAST SUPPLIES) ×1 IMPLANT
PADDING CAST COTTON 4X4 STRL (CAST SUPPLIES) ×2
PADDING CAST COTTON 6X4 STRL (CAST SUPPLIES) ×1 IMPLANT
PENCIL BUTTON HOLSTER BLD 10FT (ELECTRODE) ×2 IMPLANT
PROS 1/3 TUB PL 7H 85M (Orthopedic Implant) ×2 IMPLANT
SCREW CANC FT/18 4.0 (Screw) ×2 IMPLANT
SCREW CORTEX 3.5 14MM (Screw) ×2 IMPLANT
SCREW CORTEX 3.5 16MM (Screw) ×1 IMPLANT
SCREW CORTEX 3.5 26MM (Screw) ×1 IMPLANT
SCREW LOCK CORT ST 3.5X14 (Screw) IMPLANT
SCREW LOCK CORT ST 3.5X16 (Screw) ×1 IMPLANT
SCREW LOCK CORT ST 3.5X26 (Screw) IMPLANT
SPLINT FAST PLASTER 5X30 (CAST SUPPLIES) ×15
SPLINT PLASTER CAST FAST 5X30 (CAST SUPPLIES) IMPLANT
SPONGE GAUZE 4X4 12PLY (GAUZE/BANDAGES/DRESSINGS) ×2 IMPLANT
SPONGE LAP 4X18 X RAY DECT (DISPOSABLE) ×1 IMPLANT
STAPLER VISISTAT (STAPLE) IMPLANT
STOCKINETTE 6  STRL (DRAPES) ×1
STOCKINETTE 6 STRL (DRAPES) ×1 IMPLANT
STRIP CLOSURE SKIN 1/2X4 (GAUZE/BANDAGES/DRESSINGS) ×1 IMPLANT
SUCTION FRAZIER TIP 10 FR DISP (SUCTIONS) ×1 IMPLANT
SUT ETHILON 3 0 PS 1 (SUTURE) ×2 IMPLANT
SUT ETHILON 4 0 PS 2 18 (SUTURE) IMPLANT
SUT MON AB 4-0 PC3 18 (SUTURE) ×1 IMPLANT
SUT VIC AB 2-0 SH 27 (SUTURE) ×2
SUT VIC AB 2-0 SH 27XBRD (SUTURE) ×1 IMPLANT
SUT VIC AB 3-0 FS2 27 (SUTURE) IMPLANT
SUT VICRYL 4-0 PS2 18IN ABS (SUTURE) IMPLANT
SYR 20CC LL (SYRINGE) IMPLANT
SYR BULB 3OZ (MISCELLANEOUS) ×2 IMPLANT
TOWEL OR NON WOVEN STRL DISP B (DISPOSABLE) ×2 IMPLANT
TUBE CONNECTING 20X1/4 (TUBING) ×2 IMPLANT
UNDERPAD 30X30 INCONTINENT (UNDERPADS AND DIAPERS) ×2 IMPLANT
YANKAUER SUCT BULB TIP NO VENT (SUCTIONS) ×1 IMPLANT

## 2012-12-22 NOTE — Op Note (Signed)
Procedure(s): OPEN REDUCTION INTERNAL FIXATION (ORIF) ANKLE FRACTURE Procedure Note  Susan English female 22 y.o. 12/22/2012  Procedure(s) and Anesthesia Type:    * OPEN REDUCTION INTERNAL FIXATION (ORIF) ANKLE FRACTURE - General  Surgeon(s) and Role:    * Mable Paris, MD - Primary   Indications:  22 y.o. female s/p fall with right ankle fracture. Indicated for surgery to promote anatomic restoration of joint.     Surgeon: Mable Paris   Assistants: Damita Lack PA-C [facility] was present and scrubbed throughout the procedure and was essential in positioning, retraction, exposure, and closure)  Anesthesia: General endotracheal anesthesia with preoperative regional block given by the attending anesthesiologist      Procedure Detail  OPEN REDUCTION INTERNAL FIXATION (ORIF) ANKLE FRACTURE  Findings: Anatomic reduction of the oblique distal fibular fracture with one interfragmentary lag screw and a 7 hole one third tubular plate  Estimated Blood Loss:  Minimal         Drains: none  Blood Given: none         Specimens: none        Complications:  * No complications entered in OR log *         Disposition: PACU - hemodynamically stable.         Condition: stable    Procedure:  The patient was identified in the preoperative  holding area where I personally marked the operative site after  verifying site side and procedure with the patient. The patient was taken back  to the operating room where general anesthesia was induced without  Complication. The patient was placed in supine position with a bump under the operative hip. A non sterile tourniquet was applied to the calf. The patient did receive IV antibiotics prior to the incision.   After the appropriate time-out, the limb was exsanguinated and the tourniquet was elevated to 300 mmHg.   A lateral incision was made over the fracture site and dissection was carried down the lateral  fibula.  The fracture was a exposed and cleaned of hematoma.  Reduction was carried out using reduction forceps and manipulation of the ankle.  An interfragmentary lag screw was placed with good compression at the fracture site.  A 7 hole plate was laid laterally and felt to be appropriate sized.  Holes proximal and distal to the fracture were sequentially drill, measured and filled with appropriate sized bicortical and unicortical screws.  Appropriate length and alignment were verified on fluoroscopic imaging.    The syndesmosis was stressed and felt to be in.  The wounds were then copiously irrigated and closed in layers with 0 Vicryl, 2-0 vicryl in a deep layer and 4-0 Monocryl for skin closure.  Sterile dressings were then applied and well padded well molded splint in a plantigrade position was applied.  The tourniquet was let down for total tourniquet time of 45 minutes.  The patient was then allowed to awaken from GA, taken to the PACU in stable condition.  POSTOPERATIVE PLAN: The patient will be non-weightbearing on the operative Extremity and will follow up in 10-14 days for wound check.

## 2012-12-22 NOTE — Transfer of Care (Signed)
Immediate Anesthesia Transfer of Care Note  Patient: Susan English  Procedure(s) Performed: Procedure(s) (LRB): OPEN REDUCTION INTERNAL FIXATION (ORIF) ANKLE FRACTURE (Right)  Patient Location: PACU  Anesthesia Type: General  Level of Consciousness: awake, oriented, sedated and patient cooperative  Airway & Oxygen Therapy: Patient Spontanous Breathing and Patient connected to face mask oxygen  Post-op Assessment: Report given to PACU RN and Post -op Vital signs reviewed and stable  Post vital signs: Reviewed and stable  Complications: No apparent anesthesia complications

## 2012-12-22 NOTE — Anesthesia Procedure Notes (Addendum)
Anesthesia Regional Block:  Popliteal block  Pre-Anesthetic Checklist: ,, timeout performed, Correct Patient, Correct Site, Correct Laterality, Correct Procedure, Correct Position, site marked, Risks and benefits discussed,  Surgical consent,  Pre-op evaluation,  At surgeon's request and post-op pain management  Laterality: Right and Lower  Prep: chloraprep       Needles:  Injection technique: Single-shot  Needle Type: Echogenic Needle     Needle Length: 9cm  Needle Gauge: 21    Additional Needles:  Procedures: ultrasound guided (picture in chart) Popliteal block Narrative:  Start time: 12/22/2012 7:17 AM End time: 12/22/2012 7:24 AM Injection made incrementally with aspirations every 5 mL.  Performed by: Personally  Anesthesiologist: Sheldon Silvan, MD  Popliteal block Procedure Name: LMA Insertion Date/Time: 12/22/2012 7:41 AM Performed by: Renella Cunas D Pre-anesthesia Checklist: Patient identified, Emergency Drugs available, Suction available and Patient being monitored Patient Re-evaluated:Patient Re-evaluated prior to inductionOxygen Delivery Method: Circle System Utilized Preoxygenation: Pre-oxygenation with 100% oxygen Intubation Type: IV induction Ventilation: Mask ventilation without difficulty LMA: LMA inserted LMA Size: 4.0 Number of attempts: 1 Airway Equipment and Method: bite block Placement Confirmation: positive ETCO2 Tube secured with: Tape Dental Injury: Teeth and Oropharynx as per pre-operative assessment

## 2012-12-22 NOTE — Anesthesia Postprocedure Evaluation (Signed)
  Anesthesia Post-op Note  Patient: Susan English  Procedure(s) Performed: Procedure(s): OPEN REDUCTION INTERNAL FIXATION (ORIF) ANKLE FRACTURE (Right)  Patient Location: PACU  Anesthesia Type:GA combined with regional for post-op pain  Level of Consciousness: awake and sedated  Airway and Oxygen Therapy: Patient Spontanous Breathing and Patient connected to face mask oxygen  Post-op Pain: moderate  Post-op Assessment: Post-op Vital signs reviewed  Post-op Vital Signs: Reviewed  Complications: No apparent anesthesia complications

## 2012-12-22 NOTE — H&P (Signed)
Susan English is an 22 y.o. female.   Chief Complaint: R ankle injury HPI: R ankle lat mal fx s/p fall.  Indicated for surgery for ankle stability, anatomic healing.  Past Medical History  Diagnosis Date  . Obesity   . ZHYQMVHQ(469.6)     Past Surgical History  Procedure Laterality Date  . Cesarean section    . Sweat gland      History reviewed. No pertinent family history. Social History:  reports that she has been smoking.  She does not have any smokeless tobacco history on file. She reports that  drinks alcohol. She reports that she does not use illicit drugs.  Allergies:  Allergies  Allergen Reactions  . Latex     Hives     Medications Prior to Admission  Medication Sig Dispense Refill  . etonogestrel (IMPLANON) 68 MG IMPL implant Inject 1 each into the skin once.      Marland Kitchen HYDROcodone-acetaminophen (NORCO/VICODIN) 5-325 MG per tablet Take 1-2 tablets by mouth every 6 (six) hours as needed for pain.  25 tablet  0  . ibuprofen (ADVIL,MOTRIN) 800 MG tablet Take 1 tablet (800 mg total) by mouth 3 (three) times daily.  21 tablet  0  . Naproxen Sodium (ALEVE PO) Take 2 capsules by mouth 2 (two) times daily as needed (pain).      . Chlorpheniramine Maleate (ALLERGY PO) Take 1 tablet by mouth daily as needed (seasonal allergies).        No results found for this or any previous visit (from the past 48 hour(s)). No results found.  Review of Systems  All other systems reviewed and are negative.    Blood pressure 114/80, pulse 86, temperature 98.5 F (36.9 C), temperature source Oral, resp. rate 19, height 5' 8.5" (1.74 m), weight 136.107 kg (300 lb 1 oz), last menstrual period 09/22/2012, SpO2 100.00%. Physical Exam  Constitutional: She is oriented to person, place, and time. She appears well-developed and well-nourished.  HENT:  Head: Atraumatic.  Eyes: EOM are normal.  Cardiovascular: Intact distal pulses.   Respiratory: Effort normal.  Musculoskeletal:       Right  ankle: She exhibits swelling. Tenderness. Lateral malleolus tenderness found.  Neurological: She is alert and oriented to person, place, and time.  Skin: Skin is warm and dry.  Psychiatric: She has a normal mood and affect.     Assessment/Plan R ankle lat mal fx s/p fall.  Indicated for surgery for ankle stability, anatomic healing. Risks / benefits of surgery discussed Consent on chart  NPO for OR Preop antibiotics   Donnie Panik WILLIAM 12/22/2012, 7:29 AM

## 2012-12-22 NOTE — Progress Notes (Signed)
Assisted Dr. Crews with right, ultrasound guided, popliteal block. Side rails up, monitors on throughout procedure. See vital signs in flow sheet. Tolerated Procedure well. 

## 2012-12-22 NOTE — Anesthesia Preprocedure Evaluation (Signed)
Anesthesia Evaluation  Patient identified by MRN, date of birth, ID band Patient awake    Reviewed: Allergy & Precautions, H&P , NPO status , Patient's Chart, lab work & pertinent test results  Airway Mallampati: I TM Distance: >3 FB Neck ROM: Full    Dental  (+) Teeth Intact and Dental Advisory Given   Pulmonary  breath sounds clear to auscultation        Cardiovascular Rhythm:Regular Rate:Normal     Neuro/Psych    GI/Hepatic   Endo/Other  Morbid obesity  Renal/GU      Musculoskeletal   Abdominal   Peds  Hematology   Anesthesia Other Findings   Reproductive/Obstetrics                           Anesthesia Physical Anesthesia Plan  ASA: II  Anesthesia Plan: General   Post-op Pain Management:    Induction: Intravenous  Airway Management Planned: LMA  Additional Equipment:   Intra-op Plan:   Post-operative Plan: Extubation in OR  Informed Consent: I have reviewed the patients History and Physical, chart, labs and discussed the procedure including the risks, benefits and alternatives for the proposed anesthesia with the patient or authorized representative who has indicated his/her understanding and acceptance.   Dental advisory given  Plan Discussed with: CRNA, Anesthesiologist and Surgeon  Anesthesia Plan Comments:         Anesthesia Quick Evaluation

## 2012-12-23 ENCOUNTER — Encounter (HOSPITAL_BASED_OUTPATIENT_CLINIC_OR_DEPARTMENT_OTHER): Payer: Self-pay | Admitting: Orthopedic Surgery

## 2013-04-30 ENCOUNTER — Emergency Department (HOSPITAL_COMMUNITY): Payer: Medicaid Other

## 2013-04-30 ENCOUNTER — Emergency Department (HOSPITAL_COMMUNITY)
Admission: EM | Admit: 2013-04-30 | Discharge: 2013-05-01 | Disposition: A | Discharge status: Home / Self Care | Payer: Medicaid Other | Attending: Emergency Medicine | Admitting: Emergency Medicine

## 2013-04-30 ENCOUNTER — Encounter (HOSPITAL_COMMUNITY): Payer: Self-pay | Admitting: Emergency Medicine

## 2013-04-30 DIAGNOSIS — IMO0002 Reserved for concepts with insufficient information to code with codable children: Secondary | ICD-10-CM | POA: Insufficient documentation

## 2013-04-30 DIAGNOSIS — S8392XA Sprain of unspecified site of left knee, initial encounter: Secondary | ICD-10-CM

## 2013-04-30 DIAGNOSIS — Z91040 Latex allergy status: Secondary | ICD-10-CM | POA: Insufficient documentation

## 2013-04-30 DIAGNOSIS — Y939 Activity, unspecified: Secondary | ICD-10-CM | POA: Insufficient documentation

## 2013-04-30 DIAGNOSIS — R296 Repeated falls: Secondary | ICD-10-CM | POA: Insufficient documentation

## 2013-04-30 DIAGNOSIS — E669 Obesity, unspecified: Secondary | ICD-10-CM | POA: Insufficient documentation

## 2013-04-30 DIAGNOSIS — F17200 Nicotine dependence, unspecified, uncomplicated: Secondary | ICD-10-CM | POA: Insufficient documentation

## 2013-04-30 DIAGNOSIS — Z8669 Personal history of other diseases of the nervous system and sense organs: Secondary | ICD-10-CM | POA: Insufficient documentation

## 2013-04-30 DIAGNOSIS — Y929 Unspecified place or not applicable: Secondary | ICD-10-CM | POA: Insufficient documentation

## 2013-04-30 DIAGNOSIS — Z96669 Presence of unspecified artificial ankle joint: Secondary | ICD-10-CM | POA: Insufficient documentation

## 2013-04-30 DIAGNOSIS — Z79899 Other long term (current) drug therapy: Secondary | ICD-10-CM | POA: Insufficient documentation

## 2013-04-30 MED ORDER — HYDROCODONE-ACETAMINOPHEN 5-325 MG PO TABS
1.0000 | ORAL_TABLET | Freq: Once | ORAL | Status: AC
  Administered 2013-05-01: 1 via ORAL
  Filled 2013-04-30: qty 1

## 2013-04-30 MED ORDER — IBUPROFEN 400 MG PO TABS
800.0000 mg | ORAL_TABLET | Freq: Once | ORAL | Status: AC
  Administered 2013-05-01: 800 mg via ORAL
  Filled 2013-04-30: qty 2

## 2013-04-30 MED ORDER — HYDROCODONE-ACETAMINOPHEN 5-325 MG PO TABS: 1.0000 | ORAL_TABLET | ORAL | Status: DC | PRN

## 2013-04-30 NOTE — ED Notes (Signed)
Correction to the above statement it is the pts lt knee that is injured not the rt

## 2013-04-30 NOTE — ED Notes (Addendum)
The pt is c/o lt knee pain she tripped and fell when her lt abkle gave way.

## 2013-05-01 NOTE — Progress Notes (Signed)
Orthopedic Tech Progress Note Patient Details:  Susan English [birth year] [id number]  Ortho Devices Type of Ortho Device: Knee Immobilizer;Crutches   Haskell Flirt 05/01/2013, 12:07 AM

## 2013-05-02 NOTE — ED Provider Notes (Signed)
CSN: [id number]     Arrival date & time 04/30/13  2225 History   First MD Initiated Contact with Patient 04/30/13 2323     Chief Complaint  Patient presents with  . Knee Injury   (Consider location/radiation/quality/duration/timing/severity/associated sxs/prior Treatment) HPI History provided by pt.   Pt reports that she has h/o ORIF L ankle, the ankle gave way on her this evening, causing her to fall and land on her L knee.  Has had gradually worsening pain ever since.  Was able to bear weight initially but can't now.  Pain also aggravated by flexion.  Non-radiating.  Has not taken anything for pain.  No associated paresthesias of LLE.  Did not sustain any other injuries nor hit her head when she fell.   Past Medical History  Diagnosis Date  . Obesity   . YQMVHQIO(962.9)    Past Surgical History  Procedure Laterality Date  . Cesarean section    . Sweat gland    . Orif ankle fracture Right 12/22/2012    Procedure: OPEN REDUCTION INTERNAL FIXATION (ORIF) ANKLE FRACTURE;  Surgeon: Mable Paris, MD;  Location: [facility];  Service: Orthopedics;  Laterality: Right;   No family history on file. History  Substance Use Topics  . Smoking status: Current Every Day Smoker -- 0.50 packs/day  . Smokeless tobacco: Not on file  . Alcohol Use: Yes     Comment: occ   OB History   Grav Para Term Preterm Abortions TAB SAB Ect Mult Living                 Review of Systems  All other systems reviewed and are negative.    Allergies  Latex  Home Medications   Current Outpatient Rx  Name  Route  Sig  Dispense  Refill  . Chlorpheniramine Maleate (ALLERGY PO)   Oral   Take 1 tablet by mouth daily as needed (seasonal allergies).         Marland Kitchen etonogestrel (IMPLANON) 68 MG IMPL implant   Subcutaneous   Inject 1 each into the skin once.         Marland Kitchen HYDROcodone-acetaminophen (NORCO/VICODIN) 5-325 MG per tablet   Oral   Take 1 tablet by mouth every 4 (four) hours as  needed for moderate pain.   15 tablet   0    BP 122/88  Pulse 86  Temp(Src) 98.4 F (36.9 C) (Oral)  Resp 22  SpO2 100% Physical Exam  Nursing note and vitals reviewed. Constitutional: She is oriented to person, place, and time. She appears well-developed and well-nourished.  crying  HENT:  Head: Normocephalic and atraumatic.  Eyes:  Normal appearance  Neck: Normal range of motion.  Pulmonary/Chest: Effort normal.  Musculoskeletal: Normal range of motion.  Exam limited because patient is guarding when I attempt to palpate and range her L knee.  No obvious deformity, edema or ecchymosis.  Pain in knee w/ passive ROM of ankle.  2+ DP pulse and distal sensation intact.    Neurological: She is alert and oriented to person, place, and time.  Psychiatric: She has a normal mood and affect. Her behavior is normal.    ED Course  Procedures (including critical care time) Labs Review Labs Reviewed - No data to display Imaging Review Dg Knee Complete 4 Views Left  04/30/2013   CLINICAL DATA:  Twisting injury to left knee.  Left knee pain.  EXAM: LEFT KNEE - COMPLETE 4+ VIEW  COMPARISON:  None.  FINDINGS: There is no evidence of fracture or dislocation. The joint spaces are preserved. No significant degenerative change is seen; the patellofemoral joint is grossly unremarkable in appearance.  A small knee joint effusion is noted. The visualized soft tissues are otherwise grossly unremarkable, though the quadriceps tendon is difficult to fully assess.  IMPRESSION: 1. No evidence of fracture or dislocation. 2. Small knee joint effusion noted. If the patient's symptoms persist, MRI of the knee would be helpful for further evaluation, to exclude internal derangement.   Electronically Signed   By: Roanna Raider M.D.   On: 04/30/2013 23:33    EKG Interpretation   None       MDM   1. Sprain of left knee, initial encounter    Healthy 22yo F presents w/ L knee injury.  Xray neg for  fx/dislocation and NV intact on exam.  Pt is carrying on d/t pain.  I recommended that she contact her orthopedist Monday.  Ortho tech provided her w/ knee immobilizer and crutches.  She received 2 vicodin + ibuprofen in ED and d/c'd home w/ 15 more.      Otilio Miu, PA-C 05/02/13 [phone]

## 2013-05-05 NOTE — ED Provider Notes (Signed)
Medical screening examination/treatment/procedure(s) were performed by non-physician practitioner and as supervising physician I was immediately available for consultation/collaboration.     Brandt Loosen, MD 05/05/13 [phone]

## 2013-09-03 ENCOUNTER — Emergency Department (HOSPITAL_COMMUNITY)
Admission: EM | Admit: 2013-09-03 | Discharge: 2013-09-03 | Disposition: A | Discharge status: Home / Self Care | Payer: Medicaid Other | Attending: Emergency Medicine | Admitting: Emergency Medicine

## 2013-09-03 ENCOUNTER — Encounter (HOSPITAL_COMMUNITY): Payer: Self-pay | Admitting: Emergency Medicine

## 2013-09-03 DIAGNOSIS — Z792 Long term (current) use of antibiotics: Secondary | ICD-10-CM | POA: Insufficient documentation

## 2013-09-03 DIAGNOSIS — J029 Acute pharyngitis, unspecified: Secondary | ICD-10-CM | POA: Insufficient documentation

## 2013-09-03 DIAGNOSIS — R51 Headache: Secondary | ICD-10-CM | POA: Insufficient documentation

## 2013-09-03 DIAGNOSIS — J3489 Other specified disorders of nose and nasal sinuses: Secondary | ICD-10-CM | POA: Insufficient documentation

## 2013-09-03 DIAGNOSIS — E669 Obesity, unspecified: Secondary | ICD-10-CM | POA: Insufficient documentation

## 2013-09-03 DIAGNOSIS — H9209 Otalgia, unspecified ear: Secondary | ICD-10-CM | POA: Insufficient documentation

## 2013-09-03 DIAGNOSIS — Z91040 Latex allergy status: Secondary | ICD-10-CM | POA: Insufficient documentation

## 2013-09-03 DIAGNOSIS — F17200 Nicotine dependence, unspecified, uncomplicated: Secondary | ICD-10-CM | POA: Insufficient documentation

## 2013-09-03 DIAGNOSIS — R0981 Nasal congestion: Secondary | ICD-10-CM

## 2013-09-03 DIAGNOSIS — R519 Headache, unspecified: Secondary | ICD-10-CM

## 2013-09-03 LAB — URINALYSIS, ROUTINE W REFLEX MICROSCOPIC
Bilirubin Urine: NEGATIVE
GLUCOSE, UA: NEGATIVE mg/dL
KETONES UR: NEGATIVE mg/dL
LEUKOCYTES UA: NEGATIVE
NITRITE: NEGATIVE
PH: 6 (ref 5.0–8.0)
Protein, ur: NEGATIVE mg/dL
SPECIFIC GRAVITY, URINE: 1.023 (ref 1.005–1.030)
Urobilinogen, UA: 0.2 mg/dL (ref 0.0–1.0)

## 2013-09-03 LAB — RAPID STREP SCREEN (MED CTR MEBANE ONLY): Streptococcus, Group A Screen (Direct): NEGATIVE

## 2013-09-03 LAB — URINE MICROSCOPIC-ADD ON

## 2013-09-03 MED ORDER — IBUPROFEN 400 MG PO TABS
600.0000 mg | ORAL_TABLET | Freq: Once | ORAL | Status: AC
  Administered 2013-09-03: 600 mg via ORAL
  Filled 2013-09-03 (×2): qty 1

## 2013-09-03 MED ORDER — LORATADINE 10 MG PO TABS: 10.0000 mg | ORAL_TABLET | Freq: Every day | ORAL | Status: DC

## 2013-09-03 NOTE — ED Notes (Signed)
NP Gail at bedside 

## 2013-09-03 NOTE — ED Provider Notes (Signed)
Medical screening examination/treatment/procedure(s) were performed by non-physician practitioner and as supervising physician I was immediately available for consultation/collaboration.   EKG Interpretation None        Judge Duque Y. Alencia Gordon, MD 09/03/13 2332 

## 2013-09-03 NOTE — ED Provider Notes (Signed)
CSN: [id number]     Arrival date & time 09/03/13  1823 History   First MD Initiated Contact with Patient 09/03/13 2001     Chief Complaint  Patient presents with  . Headache     (Consider location/radiation/quality/duration/timing/severity/associated sxs/prior Treatment) HPI Comments: Patient with a history of seasonal allergies, has had 2 days of increased congestion and sore throat.  She has not taken any medication for any of her symptoms, stating, that her throat hurts her to swallow, so she hasn't had anything to eat or drink or take any medication  Patient is a 23 y.o. female presenting with headaches. The history is provided by the patient.  Headache Pain location:  Generalized Radiates to:  Does not radiate Severity currently:  5/10 Severity at highest:  5/10 Onset quality:  Gradual Duration:  2 days Timing:  Constant Progression:  Unchanged Chronicity:  New Similar to prior headaches: yes   Context comment:  URI symptoms Relieved by:  None tried Worsened by:  Nothing tried Ineffective treatments:  None tried Associated symptoms: ear pain, sinus pressure, sore throat and URI   Associated symptoms: no dizziness, no pain, no fever, no nausea, no visual change and no vomiting     Past Medical History  Diagnosis Date  . Obesity   . ZHYQMVHQ(469.6Headache(784.0)    Past Surgical History  Procedure Laterality Date  . Cesarean section    . Sweat gland    . Orif ankle fracture Right 12/22/2012    Procedure: OPEN REDUCTION INTERNAL FIXATION (ORIF) ANKLE FRACTURE;  Surgeon: Mable ParisJustin William Chandler, MD;  Location: [facility];  Service: Orthopedics;  Laterality: Right;   No family history on file. History  Substance Use Topics  . Smoking status: Current Every Day Smoker -- 0.50 packs/day  . Smokeless tobacco: Not on file  . Alcohol Use: Yes     Comment: occ   OB History   Grav Para Term Preterm Abortions TAB SAB Ect Mult Living                 Review of Systems   Constitutional: Negative for fever.  HENT: Positive for ear pain, sinus pressure and sore throat. Negative for rhinorrhea and trouble swallowing.   Eyes: Negative for pain.  Gastrointestinal: Negative for nausea and vomiting.  Neurological: Positive for headaches. Negative for dizziness.  All other systems reviewed and are negative.     Allergies  Latex  Home Medications   Prior to Admission medications   Medication Sig Start Date End Date Taking? Authorizing Provider  diphenhydrAMINE (BENADRYL) 25 MG tablet Take 50 mg by mouth at bedtime as needed for allergies.   Yes Historical Provider, MD  etonogestrel (IMPLANON) 68 MG IMPL implant Inject into the skin once. Implanted April 2014   Yes Historical Provider, MD  ibuprofen (ADVIL,MOTRIN) 600 MG tablet Take 600 mg by mouth every 4 (four) hours as needed (pain).   Yes Historical Provider, MD  Multiple Vitamin (MULTIVITAMIN WITH MINERALS) TABS tablet Take 1 tablet by mouth daily.   Yes Historical Provider, MD  penicillin v potassium (VEETID) 500 MG tablet Take 500 mg by mouth 4 (four) times daily. 10 day course completed 09/02/13    Historical Provider, MD   BP 125/74  Pulse 83  Temp(Src) 98.4 F (36.9 C) (Oral)  Resp 18  Wt 293 lb 3 oz (132.989 kg)  SpO2 100% Physical Exam  Nursing note and vitals reviewed. Constitutional: She is oriented to person, place, and time. She appears  well-developed and well-nourished.  HENT:  Head: Normocephalic.  Right Ear: External ear normal.  Left Ear: External ear normal.  Mouth/Throat: Oropharynx is clear and moist.  Eyes: Pupils are equal, round, and reactive to light.  Neck: Normal range of motion.  Cardiovascular: Normal rate.   Pulmonary/Chest: Effort normal.  Abdominal: Soft.  Musculoskeletal: Normal range of motion.  Neurological: She is alert and oriented to person, place, and time.  Skin: Skin is warm and dry.    ED Course  Procedures (including critical care time) Labs  Review Labs Reviewed  URINALYSIS, ROUTINE W REFLEX MICROSCOPIC - Abnormal; Notable for the following:    Hgb urine dipstick MODERATE (*)    All other components within normal limits  URINE MICROSCOPIC-ADD ON - Abnormal; Notable for the following:    Squamous Epithelial / LPF FEW (*)    All other components within normal limits  RAPID STREP SCREEN  CULTURE, GROUP A STREP    Imaging Review No results found.   EKG Interpretation None      MDM  Patient will be given, ibuprofen for her headache been encouraged to drink fluids.  I will check a urine for completeness sake Final diagnoses:  Nasal congestion  Headache         Arman FilterGail K Lennie Dunnigan, NP 09/03/13 2154

## 2013-09-03 NOTE — ED Notes (Signed)
Pt here with nasal congestion, ear "fullness", mild HA and sore throat x 2 days. Denies taking anything for pain. Neuro intact. Throat WDL.

## 2013-09-03 NOTE — ED Notes (Signed)
The pt does not feel well for 2 days she has a headache and a sorethroat

## 2013-09-05 LAB — CULTURE, GROUP A STREP

## 2013-09-15 ENCOUNTER — Ambulatory Visit: Payer: Self-pay | Admitting: Dietician

## 2013-10-19 ENCOUNTER — Emergency Department (HOSPITAL_COMMUNITY)
Admission: EM | Admit: 2013-10-19 | Discharge: 2013-10-20 | Disposition: A | Discharge status: Home / Self Care | Payer: Medicaid Other | Attending: Emergency Medicine | Admitting: Emergency Medicine

## 2013-10-19 ENCOUNTER — Encounter (HOSPITAL_COMMUNITY): Payer: Self-pay | Admitting: Emergency Medicine

## 2013-10-19 DIAGNOSIS — F17200 Nicotine dependence, unspecified, uncomplicated: Secondary | ICD-10-CM | POA: Insufficient documentation

## 2013-10-19 DIAGNOSIS — Z91040 Latex allergy status: Secondary | ICD-10-CM | POA: Insufficient documentation

## 2013-10-19 DIAGNOSIS — Z79899 Other long term (current) drug therapy: Secondary | ICD-10-CM | POA: Insufficient documentation

## 2013-10-19 DIAGNOSIS — R5383 Other fatigue: Secondary | ICD-10-CM

## 2013-10-19 DIAGNOSIS — R55 Syncope and collapse: Secondary | ICD-10-CM | POA: Insufficient documentation

## 2013-10-19 DIAGNOSIS — Z792 Long term (current) use of antibiotics: Secondary | ICD-10-CM | POA: Insufficient documentation

## 2013-10-19 DIAGNOSIS — R5381 Other malaise: Secondary | ICD-10-CM | POA: Insufficient documentation

## 2013-10-19 DIAGNOSIS — E669 Obesity, unspecified: Secondary | ICD-10-CM | POA: Insufficient documentation

## 2013-10-19 DIAGNOSIS — R110 Nausea: Secondary | ICD-10-CM | POA: Insufficient documentation

## 2013-10-19 DIAGNOSIS — Z791 Long term (current) use of non-steroidal anti-inflammatories (NSAID): Secondary | ICD-10-CM | POA: Insufficient documentation

## 2013-10-19 LAB — CBG MONITORING, ED
GLUCOSE-CAPILLARY: 92 mg/dL (ref 70–99)
Glucose-Capillary: 129 mg/dL — ABNORMAL HIGH (ref 70–99)

## 2013-10-19 LAB — COMPREHENSIVE METABOLIC PANEL
ALBUMIN: 3.3 g/dL — AB (ref 3.5–5.2)
ALT: 14 U/L (ref 0–35)
AST: 21 U/L (ref 0–37)
Alkaline Phosphatase: 102 U/L (ref 39–117)
BILIRUBIN TOTAL: 0.3 mg/dL (ref 0.3–1.2)
BUN: 8 mg/dL (ref 6–23)
CALCIUM: 9.3 mg/dL (ref 8.4–10.5)
CHLORIDE: 104 meq/L (ref 96–112)
CO2: 20 mEq/L (ref 19–32)
CREATININE: 0.53 mg/dL (ref 0.50–1.10)
GFR calc Af Amer: >90 mL/min (ref >90)
GFR calc non Af Amer: >90 mL/min (ref >90)
Glucose, Bld: 122 mg/dL — ABNORMAL HIGH (ref 70–99)
Potassium: 5.2 mEq/L (ref 3.7–5.3)
Sodium: 139 mEq/L (ref 137–147)
Total Protein: 7.8 g/dL (ref 6.0–8.3)

## 2013-10-19 LAB — URINE MICROSCOPIC-ADD ON

## 2013-10-19 LAB — CBC
HEMATOCRIT: 37.8 % (ref 36.0–46.0)
HEMOGLOBIN: 12.2 g/dL (ref 12.0–15.0)
MCH: 27.3 pg (ref 26.0–34.0)
MCHC: 32.3 g/dL (ref 30.0–36.0)
MCV: 84.6 fL (ref 78.0–100.0)
Platelets: 320 10*3/uL (ref 150–400)
RBC: 4.47 MIL/uL (ref 3.87–5.11)
RDW: 14.6 % (ref 11.5–15.5)
WBC: 9.2 10*3/uL (ref 4.0–10.5)

## 2013-10-19 LAB — URINALYSIS, ROUTINE W REFLEX MICROSCOPIC
Bilirubin Urine: NEGATIVE
GLUCOSE, UA: NEGATIVE mg/dL
Ketones, ur: NEGATIVE mg/dL
Leukocytes, UA: NEGATIVE
Nitrite: NEGATIVE
PROTEIN: NEGATIVE mg/dL
SPECIFIC GRAVITY, URINE: 1.017 (ref 1.005–1.030)
Urobilinogen, UA: 1 mg/dL (ref 0.0–1.0)
pH: 6 (ref 5.0–8.0)

## 2013-10-19 LAB — POC URINE PREG, ED: Preg Test, Ur: NEGATIVE

## 2013-10-19 MED ORDER — SODIUM CHLORIDE 0.9 % IV BOLUS (SEPSIS)
1000.0000 mL | Freq: Once | INTRAVENOUS | Status: AC
  Administered 2013-10-19: 1000 mL via INTRAVENOUS

## 2013-10-19 MED ORDER — ACETAMINOPHEN 325 MG PO TABS
650.0000 mg | ORAL_TABLET | Freq: Once | ORAL | Status: AC
  Administered 2013-10-19: 650 mg via ORAL
  Filled 2013-10-19: qty 2

## 2013-10-19 MED ORDER — ONDANSETRON 4 MG PO TBDP
4.0000 mg | ORAL_TABLET | Freq: Once | ORAL | Status: AC
  Administered 2013-10-19: 4 mg via ORAL
  Filled 2013-10-19: qty 1

## 2013-10-19 NOTE — ED Provider Notes (Signed)
Date: 10/19/2013  Rate: 82  Rhythm: normal sinus rhythm  QRS Axis: normal  Intervals: normal  ST/T Wave abnormalities: normal  Conduction Disutrbances:none     Joya Gaskins, MD 10/19/13 2245

## 2013-10-19 NOTE — ED Notes (Signed)
CBG 129 

## 2013-10-19 NOTE — ED Notes (Addendum)
Patient arrives via ems, patient had a headache and was walking.  She fell and had syncope episode. Patient then had another syncope episode when family was trying to get her in the car,.  Patient has irregular heart rhythm.  Initial cbg was 56.  Patient was given snack, increased to 82.  Most recent cbg 127.  Patient continues to have headache.  She states she has felt bad all day.  She is now reporting nausea

## 2013-10-19 NOTE — ED Notes (Signed)
The patient pulled her IV out cause she said it was hurting her.

## 2013-10-19 NOTE — Discharge Instructions (Signed)
°Emergency Department Resource Guide °1) Find a Doctor and Pay Out of Pocket °Although you won't have to find out who is covered by your insurance plan, it is a good idea to ask around and get recommendations. You will then need to call the office and see if the doctor you have chosen will accept you as a new patient and what types of options they offer for patients who are self-pay. Some doctors offer discounts or will set up payment plans for their patients who do not have insurance, but you will need to ask so you aren't surprised when you get to your appointment. ° °2) Contact Your Local Health Department °Not all health departments have doctors that can see patients for sick visits, but many do, so it is worth a call to see if yours does. If you don't know where your local health department is, you can check in your phone book. The CDC also has a tool to help you locate your state's health department, and many state websites also have listings of all of their local health departments. ° °3) Find a Walk-in Clinic °If your illness is not likely to be very severe or complicated, you may want to try a walk in clinic. These are popping up all over the country in pharmacies, drugstores, and shopping centers. They're usually staffed by nurse practitioners or physician assistants that have been trained to treat common illnesses and complaints. They're usually fairly quick and inexpensive. However, if you have serious medical issues or chronic medical problems, these are probably not your best option. ° °No Primary Care Doctor: °- Call Health Connect at  832-8000 - they can help you locate a primary care doctor that  accepts your insurance, provides certain services, etc. °- Physician Referral Service- 1-800-533-3463 ° °Chronic Pain Problems: °Organization         Address  Phone   Notes  °[facility]  (336) 297-2271 Patients need to be referred by their primary care doctor.  ° °Medication  Assistance: °Organization         Address  Phone   Notes  °[place] [facility] [address] °[place], [place] [address] (336) 641-8030 --Must be a resident of [place] °-- Must have NO insurance coverage whatsoever (no Medicaid/ Medicare, etc.) °-- The pt. MUST have a primary care doctor that directs their care regularly and follows them in the community °  °[facility]  (866) 331-1348   °[employer]  (888) 892-1162   ° °Agencies that provide inexpensive medical care: °Organization         Address  Phone   Notes  °[facility]  (336) 832-8035   °[facility]    (336) 832-7272   °[facility] [address] °[place], [place] [address] (336) 832-4777   °[facility] [address], °[place] (336) 271-4999   °[facility]    (336) 373-0678   °[facility]    (336) 272-1050   °[facility] ° [address], [place] Phone:  (336) 832-4444, Fax:  (336) 832-4440 Hours of Operation:  9 am - 6 pm, M-F.  Also accepts Medicaid/Medicare and self-pay.  °[facility] ° [address], [place] Phone: (336) 832-3150, Fax: (336) 832-3151. Hours of Operation:  8:30 am - 5:30 pm, M-F.  Also accepts Medicaid and self-pay.  °[facility] [place] [address], [place] Phone: (336) 878-6027   °[facility] [address], [place], [place] (336)723-1848, Ext. 123 Mondays & Thursdays: 7-9 AM.  First 15 patients are seen on a first come, first serve basis. °  ° °Medicaid-accepting [place] Providers: ° °Organization         Address  Phone   Notes  °[facility] [address], [place] (336) 641-2100 Also accepts self-pay patients.  °[facility] [address], [place] ° (336) 856-9996   °[facility] [address], [place]  (336) 288-8857   °[facility] [address] [place] [address], [place] (336) 299-7000   °Veita Bland [address], [place]  ° (336) 373-1557 Only accepts [place] Access Medicaid patients after they have their name applied to their card.  ° °Self-Pay (no insurance) in [place]: ° °Organization         Address  Phone   Notes  °Sickle Cell Patients, [facility] [address], [place] (336) 832-1970   °[facility] [address], [place] (336) 832-4400   °[facility] ° [address], [place] (336) 992-4800   °[facility]/Dr. Osei-Bonsu ° [address] [place] [address], [place] or [address], [place] (336) [phone] Phone number for both [place] and [place] locations is the same.  °[facility] [address], [place] (336) 299-0000   °[facility] [address] [place] [address], [place] or [address] (336) 852-7530 °(336) 878-2260   °[facility] [address], [place] (336) 350-1642, phone; (336) 294-5005, fax Sees patients 1st and 3rd Saturday of every month.  Must not qualify for public or private insurance (i.e. Medicaid, Medicare, [place] Health Choice, Veterans' Benefits) • Household income should be no more than 200% of the poverty level •The clinic cannot treat you if you are pregnant or think you are pregnant • Sexually transmitted diseases are not treated at the clinic.  ° ° °Dental Care: °Organization         Address  Phone  Notes  °[place] [facility] [address], [place] (336) 641-6152 Accepts children up to age 21 who are enrolled in Medicaid or [place] Health Choice; pregnant women with a Medicaid card; and children who have applied for Medicaid or [place] Health Choice, but were declined, whose parents can pay a reduced fee at time of service.  °[place]  [facility] [place]  [address], [place] (336) 641-7733 Accepts children up to age 21 who are enrolled in Medicaid or [place] Health Choice; pregnant women with a Medicaid card; and children who have applied for Medicaid or [place] Health Choice, but were declined, whose parents can pay a reduced fee at time of service.  °[facility] ° [address], [place] (336) 641-4533 Patients are seen by appointment only. Walk-ins are not accepted. [facility] will see patients 18 years of age and older. °Monday - Tuesday (8am-5pm) °Most Wednesdays (8:30-5pm) °$30 per visit, cash only  °[facility] ° [address], [place] (336) 641-4533 Patients are seen by appointment only. Walk-ins are not accepted. [facility] will see patients 18 years of age and older. °One   Wednesday Evening (Monthly: Volunteer Based).  $30 per visit, cash only  °[facility]  (919) 537-3737 for adults; Children under age 4, call Graduate Pediatric Dentistry at (919) [phone]. Children aged 4-14, please call (919) [phone] to request a pediatric application. ° Dental services are provided in all areas of dental care including fillings, crowns and bridges, complete and partial dentures, implants, gum treatment, root canals, and extractions. Preventive care is also provided. Treatment is provided to both adults and children. °Patients are selected via a lottery and there is often a waiting list. °  °[facility] [address], °[place] ° (336) 763-8833 [email or web address] °  °[facility] [address], [place], [place] (336)723-1848, Ext. 123 Second and Fourth Thursday of each month, opens at 6:30 AM; Clinic ends at 9 AM.  Patients are seen on a first-come first-served basis, and a limited number are seen during each clinic.  ° °[facility] ° [address], [place], [place] (336) 723-7904    Eligibility Requirements °You must have lived in [place], [place], or [place] counties for at least the last three months. °  You cannot be eligible for state or federal sponsored healthcare insurance, including Veterans Administration, Medicaid, or Medicare. °  You generally cannot be eligible for healthcare insurance through your employer.  °  How to apply: °Eligibility screenings are held every Tuesday and Wednesday afternoon from 1:00 pm until 4:00 pm. You do not need an appointment for the interview!  °[facility] [address], [place], [place] 336-631-2330   °[facility]  336-342-8273   °[facility]  336-703-3100   °[facility]  336-570-6415   ° °Behavioral Health Resources in the Community: °Intensive Outpatient Programs °Organization         Address  Phone  Notes  °[facility] [address], [place], [place] 336-878-6098   °[facility] [address], [place], [place] 336-832-9800   °[facility] [address], [place], [place] ° 336-882-2125   °[facility] [address],  °[place], [place] 1-800-853-5163 or 336-641-4981   °Substance Abuse Resources °Organization         Address  Phone  Notes  °[facility]  336-882-2125   °[facility]  336-784-9470   °[facility]  336-285-9073   °[facility]  336-845-3988   °Residential & Outpatient Substance Abuse Program  1-800-659-3381   °Psychological Services °Organization         Address  Phone  Notes  °[facility]  336- 832-9600   °[facility]  336- 378-7881   °[facility] [address], [place] 1-800-853-5163 or 336-641-4981   ° °Mobile Crisis Teams °Organization         Address  Phone  Notes  °[facility], Mobile Crisis Care Unit  1-877-626-1772   °Assertive °[facility] ° [address]  [place], [place] 336-834-9664   °Sharon DeEsch [address] °[place] 336-554-5454   ° °Self-Help/Support Groups °Organization         Address  Phone             Notes  °[facility]  - variety of support groups  336- 373-1402 Call for more information  °Narcotics Anonymous (NA), [facility] [address], °[place]  2 meetings at this location  ° °  Residential Treatment Programs Organization         Address  Phone  Notes  [facility] [address],    [place]  4-098-119-1478   [facility]  [address] [place] [address], [place], [place] 308-657-8469   [facility] [address] [place], [place] 629-528-4132 Admissions: 8am-3pm M-F  [facility] [address]    [place], [place] 440-102-7253   [facility] [address] [place], [place], [place] 664-403-4742   [facility] [address]  [place], [place] 595-638-7564   [facility] Intensive Outpatient [address], [place], [place] 332-951-8841   [facility] ([facility] [address] [place]  [place], [place] 6-606-301-6010 or 660-687-7143   [facility] [address] [place], [place] 025-427-0623 Accepts Medicaid  [facility] [address] [place] [address]  [place] 7-628-315-1761 Substance Abuse/Addiction Treatment   [facility] Organization         Address  Phone  Notes  [facility]  817-061-6790   Angie Fava, PhD [address] [place], [place]   804-347-0036 or (220)289-0606   [facility]   [address] [place], [place] 316-764-9947   [facility] [address], [place], [place] 571-219-2720 Insurance/Medicaid/sponsorship through [facility] and Families [address]                                    [place], [place] 904-714-4736 Therapy/tele-psych/case    [facility] [address], [place] 424-718-8248    Dr. Lolly Mustache  (573)243-0212   [facility]  [employer] [facility] Dept. 1) [address] [place] 2) [address] [place] 3)  [address], [place] 337-258-1347 [phone]  (272) [phone]   [facility] Child Abuse Hotline 228-764-8540 or 7620904606 (After Hours)       Syncope Syncope is a fainting spell. This means the person loses consciousness and drops to the ground. The person is generally unconscious for less than 5 minutes. The person may have some muscle twitches for up to 15 seconds before waking up and returning to normal. Syncope occurs more often in elderly people, but it can happen to anyone. While most causes of syncope are not dangerous, syncope can be a sign of a serious medical problem. It is important to seek medical care.  CAUSES  Syncope is caused by a sudden decrease in blood flow to the brain. The specific cause is often not determined. Factors that can trigger syncope include:  Taking medicines that lower blood pressure.  Sudden changes in posture, such as standing up suddenly.  Taking more medicine than prescribed.  Standing in one place for too long.  Seizure disorders.  Dehydration and excessive exposure to heat.  Low blood sugar (hypoglycemia).  Straining to have a bowel movement.  Heart disease, irregular heartbeat, or other circulatory problems.  Fear, emotional distress, seeing blood, or severe pain. SYMPTOMS  Right before fainting, you may:  Feel dizzy or lightheaded.  Feel nauseous.  See all white or all black in your field of vision.  Have cold, clammy skin. DIAGNOSIS  Your caregiver will ask about your symptoms, perform a physical exam, and perform  electrocardiography (ECG) to record the electrical activity of your heart. Your caregiver may also perform other heart or blood tests to determine the cause of  your syncope. TREATMENT  In most cases, no treatment is needed. Depending on the cause of your syncope, your caregiver may recommend changing or stopping some of your medicines. HOME CARE INSTRUCTIONS  Have someone stay with you until you feel stable.  Do not drive, operate machinery, or play sports until your caregiver says it is okay.  Keep all follow-up appointments as directed by your caregiver.  Lie down right away if you start feeling like you might faint. Breathe deeply and steadily. Wait until all the symptoms have passed.  Drink enough fluids to keep your urine clear or pale yellow.  If you are taking blood pressure or heart medicine, get up slowly, taking several minutes to sit and then stand. This can reduce dizziness. SEEK IMMEDIATE MEDICAL CARE IF:   You have a severe headache.  You have unusual pain in the chest, abdomen, or back.  You are bleeding from the mouth or rectum, or you have black or tarry stool.  You have an irregular or very fast heartbeat.  You have pain with breathing.  You have repeated fainting or seizure-like jerking during an episode.  You faint when sitting or lying down.  You have confusion.  You have difficulty walking.  You have severe weakness.  You have vision problems. If you fainted, call your local emergency services (911 in [place] Do not drive yourself to the hospital.  MAKE SURE YOU:  Understand these instructions.  Will watch your condition.  Will get help right away if you are not doing well or get worse. Document Released: 04/27/2005 Document Revised: 10/27/2011 Document Reviewed: 06/26/2011 [facility] Patient Information 2014 [place], [place].

## 2013-10-19 NOTE — ED Provider Notes (Signed)
CSN: [id number]     Arrival date & time 10/19/13  1650 History   First MD Initiated Contact with Patient 10/19/13 2202     Chief Complaint  Patient presents with  . Loss of Consciousness  . Nausea  . Emesis     (Consider location/radiation/quality/duration/timing/severity/associated sxs/prior Treatment) Patient is a 23 y.o. female presenting with general illness. The history is provided by the patient.  Illness Severity:  Moderate Onset quality:  Gradual Timing:  Rare Progression:  Partially resolved Chronicity:  New Associated symptoms: fatigue and headaches   Associated symptoms: no abdominal pain, no chest pain, no congestion, no cough, no diarrhea, no fever, no rash, no rhinorrhea, no shortness of breath and no vomiting     23 yo F pw fatigue and syncope.  Woke up this am feeling fine. Later with fatigue. Developed mild headache. Similar to prior. Gradual. Right sided. No numbness/tingling/weakness. Went home. Walking in house when felt LH. Says she fell to ground. +LOC but says she remembers falling and knows she did not hit her head. No change in HA afterwards. Attempted to get patient to car to bring to ED. Had another episode of LH and may have passed out again.  EMS with cbg of 56. Given snack with improvement.  Not had previously.  No family h/o sudden cardiac death.  No abd pain. Mild nausea. No emesis.  No urinary complaints.  No edema.    Past Medical History  Diagnosis Date  . Obesity   . NWGNFAOZ(308.6Headache(784.0)    Past Surgical History  Procedure Laterality Date  . Cesarean section    . Sweat gland    . Orif ankle fracture Right 12/22/2012    Procedure: OPEN REDUCTION INTERNAL FIXATION (ORIF) ANKLE FRACTURE;  Surgeon: Mable ParisJustin William Chandler, MD;  Location: [facility];  Service: Orthopedics;  Laterality: Right;   No family history on file. History  Substance Use Topics  . Smoking status: Current Every Day Smoker -- 0.50 packs/day  . Smokeless  tobacco: Not on file  . Alcohol Use: Yes     Comment: occ   OB History   Grav Para Term Preterm Abortions TAB SAB Ect Mult Living                 Review of Systems  Constitutional: Positive for fatigue. Negative for fever and chills.  HENT: Negative for congestion and rhinorrhea.   Eyes: Negative for visual disturbance.  Respiratory: Negative for cough and shortness of breath.   Cardiovascular: Negative for chest pain and leg swelling.  Gastrointestinal: Negative for vomiting, abdominal pain and diarrhea.  Genitourinary: Negative for dysuria, hematuria, flank pain and difficulty urinating.  Musculoskeletal: Negative for back pain.  Skin: Negative for color change and rash.  Neurological: Positive for syncope and headaches. Negative for dizziness, weakness and numbness.  All other systems reviewed and are negative.     Allergies  Latex  Home Medications   Prior to Admission medications   Medication Sig Start Date End Date Taking? Authorizing Provider  diphenhydrAMINE (BENADRYL) 25 MG tablet Take 50 mg by mouth at bedtime as needed for allergies.    Historical Provider, MD  etonogestrel (IMPLANON) 68 MG IMPL implant Inject into the skin once. Implanted April 2014    Historical Provider, MD  ibuprofen (ADVIL,MOTRIN) 600 MG tablet Take 600 mg by mouth every 4 (four) hours as needed (pain).    Historical Provider, MD  loratadine (CLARITIN) 10 MG tablet Take 1 tablet (10 mg total)  by mouth daily. 09/03/13   Arman Filter, NP  Multiple Vitamin (MULTIVITAMIN WITH MINERALS) TABS tablet Take 1 tablet by mouth daily.    Historical Provider, MD  penicillin v potassium (VEETID) 500 MG tablet Take 500 mg by mouth 4 (four) times daily. 10 day course completed 09/02/13    Historical Provider, MD   BP 112/73  Pulse 82  Temp(Src) 98.6 F (37 C) (Oral)  Resp 20  Ht 5\' 9"  (1.753 m)  Wt 300 lb (136.079 kg)  BMI 44.28 kg/m2  SpO2 100% Physical Exam  Nursing note and vitals  reviewed. Constitutional: She is oriented to person, place, and time. She appears well-developed and well-nourished. No distress.  Sitting up in bed. NAD. Speaks in full sentences. Smiling.  HENT:  Head: Normocephalic and atraumatic.  Eyes: Conjunctivae and EOM are normal. Pupils are equal, round, and reactive to light. Right eye exhibits no discharge. Left eye exhibits no discharge.  Neck: No tracheal deviation present.  Cardiovascular: Normal rate, regular rhythm, normal heart sounds and intact distal pulses.   Pulmonary/Chest: Effort normal and breath sounds normal. No stridor. No respiratory distress. She has no wheezes. She has no rales.  Abdominal: Soft. She exhibits no distension. There is no tenderness. There is no guarding.  Musculoskeletal: She exhibits no edema and no tenderness.  Neurological: She is alert and oriented to person, place, and time. She has normal strength. No cranial nerve deficit or sensory deficit. Coordination normal. GCS eye subscore is 4. GCS verbal subscore is 5. GCS motor subscore is 6.  No drift Intact finger to nose  Skin: Skin is warm and dry.  Psychiatric: She has a normal mood and affect. Her behavior is normal.    ED Course  Procedures (including critical care time) Labs Review Labs Reviewed  COMPREHENSIVE METABOLIC PANEL - Abnormal; Notable for the following:    Glucose, Bld 122 (*)    Albumin 3.3 (*)    All other components within normal limits  CBG MONITORING, ED - Abnormal; Notable for the following:    Glucose-Capillary 129 (*)    All other components within normal limits  URINALYSIS, ROUTINE W REFLEX MICROSCOPIC  POCT CBG (FASTING - GLUCOSE)-MANUAL ENTRY  POC URINE PREG, ED  CBG MONITORING, ED    Imaging Review No results found.   EKG Interpretation None      MDM   Final diagnoses:  None    Syncope. EKG reassuring. Not exertional syncope. Without findings to suggest arrhythmia (WPW, brugada, etc). Not anemic. HDS.  Mild  hypoglycemia PTA. Without hypoglycemia here. Monitored in ED without recurrence. No h/o diabetes. Decreased po throughout the day. Electrolytes reassuring. Neuro intact. Not pregnant.  Patient discharged home. Return precautions given. To follow up with pcp. patient in agreement with plan.  Labs and imaging reviewed by myself and considered in medical decision making if ordered. Imaging interpreted by radiology.   Discussed case with Dr. Bebe Shaggy who is in agreement with assessment and plan.    Stevie Kern, MD 10/20/13 [phone]

## 2013-10-20 NOTE — ED Provider Notes (Signed)
I have personally seen and examined the patient.  I have discussed the plan of care with the resident.  I have reviewed the documentation on PMH/FH/Soc. History.  I have reviewed the documentation of the resident and agree.  Pt stable, no distress, she felt improved on my evaluation and she feels comfortable with d/c home.    BP 112/72  Pulse 75  Temp(Src) 98.4 F (36.9 C) (Oral)  Resp 18  Ht 5\' 9"  (1.753 m)  Wt 300 lb (136.079 kg)  BMI 44.28 kg/m2  SpO2 100%   Joya Gaskinsonald W Bao Bazen, MD 10/20/13 0147

## 2014-08-24 ENCOUNTER — Encounter (HOSPITAL_COMMUNITY): Payer: Self-pay | Admitting: *Deleted

## 2014-08-24 ENCOUNTER — Emergency Department (HOSPITAL_COMMUNITY)
Admission: EM | Admit: 2014-08-24 | Discharge: 2014-08-24 | Disposition: A | Discharge status: Home / Self Care | Payer: Medicaid Other | Attending: Emergency Medicine | Admitting: Emergency Medicine

## 2014-08-24 ENCOUNTER — Emergency Department (HOSPITAL_COMMUNITY): Payer: Medicaid Other

## 2014-08-24 DIAGNOSIS — E669 Obesity, unspecified: Secondary | ICD-10-CM | POA: Insufficient documentation

## 2014-08-24 DIAGNOSIS — R21 Rash and other nonspecific skin eruption: Secondary | ICD-10-CM | POA: Insufficient documentation

## 2014-08-24 DIAGNOSIS — W1839XA Other fall on same level, initial encounter: Secondary | ICD-10-CM | POA: Insufficient documentation

## 2014-08-24 DIAGNOSIS — Y939 Activity, unspecified: Secondary | ICD-10-CM | POA: Insufficient documentation

## 2014-08-24 DIAGNOSIS — S20212A Contusion of left front wall of thorax, initial encounter: Secondary | ICD-10-CM | POA: Insufficient documentation

## 2014-08-24 DIAGNOSIS — Z720 Tobacco use: Secondary | ICD-10-CM | POA: Insufficient documentation

## 2014-08-24 DIAGNOSIS — Y929 Unspecified place or not applicable: Secondary | ICD-10-CM | POA: Insufficient documentation

## 2014-08-24 DIAGNOSIS — Z79899 Other long term (current) drug therapy: Secondary | ICD-10-CM | POA: Insufficient documentation

## 2014-08-24 DIAGNOSIS — Y999 Unspecified external cause status: Secondary | ICD-10-CM | POA: Insufficient documentation

## 2014-08-24 DIAGNOSIS — Z91040 Latex allergy status: Secondary | ICD-10-CM | POA: Insufficient documentation

## 2014-08-24 MED ORDER — IBUPROFEN 800 MG PO TABS
800.0000 mg | ORAL_TABLET | Freq: Once | ORAL | Status: AC
  Administered 2014-08-24: 800 mg via ORAL
  Filled 2014-08-24: qty 1

## 2014-08-24 MED ORDER — TRIAMCINOLONE ACETONIDE 0.1 % EX CREA: 1.0000 "application " | TOPICAL_CREAM | Freq: Two times a day (BID) | CUTANEOUS | Status: DC

## 2014-08-24 MED ORDER — IBUPROFEN 800 MG PO TABS: 800.0000 mg | ORAL_TABLET | Freq: Three times a day (TID) | ORAL | Status: DC

## 2014-08-24 NOTE — Discharge Instructions (Signed)
Chest Contusion A chest contusion is a deep bruise on your chest area. Contusions are the result of an injury that caused bleeding under the skin. A chest contusion may involve bruising of the skin, muscles, or ribs. The contusion may turn blue, purple, or yellow. Minor injuries will give you a painless contusion, but more severe contusions may stay painful and swollen for a few weeks. CAUSES  A contusion is usually caused by a blow, trauma, or direct force to an area of the body. SYMPTOMS   Swelling and redness of the injured area.  Discoloration of the injured area.  Tenderness and soreness of the injured area.  Pain. DIAGNOSIS  The diagnosis can be made by taking a history and performing a physical exam. An X-ray, CT scan, or MRI may be needed to determine if there were any associated injuries, such as broken bones (fractures) or internal injuries. TREATMENT  Often, the best treatment for a chest contusion is resting, icing, and applying cold compresses to the injured area. Deep breathing exercises may be recommended to reduce the risk of pneumonia. Over-the-counter medicines may also be recommended for pain control. HOME CARE INSTRUCTIONS   Put ice on the injured area.  Put ice in a plastic bag.  Place a towel between your skin and the bag.  Leave the ice on for 15-20 minutes, 03-04 times a day.  Only take over-the-counter or prescription medicines as directed by your caregiver. Your caregiver may recommend avoiding anti-inflammatory medicines (aspirin, ibuprofen, and naproxen) for 48 hours because these medicines may increase bruising.  Rest the injured area.  Perform deep-breathing exercises as directed by your caregiver.  Stop smoking if you smoke.  Do not lift objects over 5 pounds (2.3 kg) for 3 days or longer if recommended by your caregiver. SEEK IMMEDIATE MEDICAL CARE IF:   You have increased bruising or swelling.  You have pain that is getting worse.  You have  difficulty breathing.  You have dizziness, weakness, or fainting.  You have blood in your urine or stool.  You cough up or vomit blood.  Your swelling or pain is not relieved with medicines. MAKE SURE YOU:   Understand these instructions.  Will watch your condition.  Will get help right away if you are not doing well or get worse. Document Released: 01/20/2001 Document Revised: 01/20/2012 Document Reviewed: 10/19/2011 [facility]ExitCare Patient Information 2015 [place] [place]. This information is not intended to replace advice given to you by your health care provider. Make sure you discuss any questions you have with your health care provider. Eczema Eczema, also called atopic dermatitis, is a skin disorder that causes inflammation of the skin. It causes a red rash and dry, scaly skin. The skin becomes very itchy. Eczema is generally worse during the cooler winter months and often improves with the warmth of summer. Eczema usually starts showing signs in infancy. Some children outgrow eczema, but it may last through adulthood.  CAUSES  The exact cause of eczema is not known, but it appears to run in families. People with eczema often have a family history of eczema, allergies, asthma, or hay fever. Eczema is not contagious. Flare-ups of the condition may be caused by:   Contact with something you are sensitive or allergic to.   Stress. SIGNS AND SYMPTOMS  Dry, scaly skin.   Red, itchy rash.   Itchiness. This may occur before the skin rash and may be very intense.  DIAGNOSIS  The diagnosis of eczema is usually made  based on symptoms and medical history. TREATMENT  Eczema cannot be cured, but symptoms usually can be controlled with treatment and other strategies. A treatment plan might include:  Controlling the itching and scratching.   Use over-the-counter antihistamines as directed for itching. This is especially useful at night when the itching tends to be worse.   Use  over-the-counter steroid creams as directed for itching.   Avoid scratching. Scratching makes the rash and itching worse. It may also result in a skin infection (impetigo) due to a break in the skin caused by scratching.   Keeping the skin well moisturized with creams every day. This will seal in moisture and help prevent dryness. Lotions that contain alcohol and water should be avoided because they can dry the skin.   Limiting exposure to things that you are sensitive or allergic to (allergens).   Recognizing situations that cause stress.   Developing a plan to manage stress.  HOME CARE INSTRUCTIONS   Only take over-the-counter or prescription medicines as directed by your health care provider.   Do not use anything on the skin without checking with your health care provider.   Keep baths or showers short (5 minutes) in warm (not hot) water. Use mild cleansers for bathing. These should be unscented. You may add nonperfumed bath oil to the bath water. It is best to avoid soap and bubble bath.   Immediately after a bath or shower, when the skin is still damp, apply a moisturizing ointment to the entire body. This ointment should be a petroleum ointment. This will seal in moisture and help prevent dryness. The thicker the ointment, the better. These should be unscented.   Keep fingernails cut short. Children with eczema may need to wear soft gloves or mittens at night after applying an ointment.   Dress in clothes made of cotton or cotton blends. Dress lightly, because heat increases itching.   A child with eczema should stay away from anyone with fever blisters or cold sores. The virus that causes fever blisters (herpes simplex) can cause a serious skin infection in children with eczema. SEEK MEDICAL CARE IF:   Your itching interferes with sleep.   Your rash gets worse or is not better within 1 week after starting treatment.   You see pus or soft yellow scabs in the rash  area.   You have a fever.   You have a rash flare-up after contact with someone who has fever blisters.  Document Released: 04/24/2000 Document Revised: 02/15/2013 Document Reviewed: 11/28/2012 [employer] Patient Information 2015 [place], [place]. This information is not intended to replace advice given to you by your health care provider. Make sure you discuss any questions you have with your health care provider.

## 2014-08-24 NOTE — ED Notes (Signed)
The pt fell last thursdat ontio her lt breast opain since then.  She also wants toi be seen for her eczema since she is here.  lmp now

## 2014-08-24 NOTE — ED Provider Notes (Signed)
CSN: [id number]     Arrival date & time 08/24/14  2112 History   First MD Initiated Contact with Patient 08/24/14 2127     Chief Complaint  Patient presents with  . Breast Pain     The history is provided by the patient. No language interpreter was used.   Ms. Christell ConstantMoore presents for evaluation of left chest wall pain. She states that she fell a week ago onto her left side when she felt faint, Zofran. She's had pain in her left chest wall since that time. The pain is worse with movement, coughing, going over speed bumps. She has tried no medications for this pain. She denies any fevers, shortness of breath, abdominal pain, vomiting, leg swelling. She denies any previous medical problems. She is also requesting reevaluated for her eczema. She has a patchy itchy rash that has been present for a very long time and she has not taken any medications for it.  Past Medical History  Diagnosis Date  . Obesity   . WUJWJXBJ(478.2Headache(784.0)    Past Surgical History  Procedure Laterality Date  . Cesarean section    . Sweat gland    . Orif ankle fracture Right 12/22/2012    Procedure: OPEN REDUCTION INTERNAL FIXATION (ORIF) ANKLE FRACTURE;  Surgeon: Mable ParisJustin William Chandler, MD;  Location: [facility];  Service: Orthopedics;  Laterality: Right;   No family history on file. History  Substance Use Topics  . Smoking status: Current Every Day Smoker -- 0.50 packs/day  . Smokeless tobacco: Not on file  . Alcohol Use: Yes     Comment: occ   OB History    No data available     Review of Systems  All other systems reviewed and are negative.     Allergies  Latex  Home Medications   Prior to Admission medications   Medication Sig Start Date End Date Taking? Authorizing Provider  diphenhydrAMINE (BENADRYL) 25 MG tablet Take 50 mg by mouth at bedtime as needed for allergies.    Historical Provider, MD  etonogestrel (IMPLANON) 68 MG IMPL implant Inject into the skin once. Implanted April 2014     Historical Provider, MD  ibuprofen (ADVIL,MOTRIN) 600 MG tablet Take 600 mg by mouth every 4 (four) hours as needed (pain).    Historical Provider, MD  loratadine (CLARITIN) 10 MG tablet Take 1 tablet (10 mg total) by mouth daily. 09/03/13   Earley FavorGail Schulz, NP  Multiple Vitamin (MULTIVITAMIN WITH MINERALS) TABS tablet Take 1 tablet by mouth daily.    Historical Provider, MD   BP 125/77 mmHg  Pulse 92  Temp(Src) 98.4 F (36.9 C) (Oral)  Resp 18  Ht 5\' 8"  (1.727 m)  Wt 290 lb (131.543 kg)  BMI 44.10 kg/m2  SpO2 96%  LMP 08/24/2014 Physical Exam  Constitutional: She is oriented to person, place, and time. She appears well-developed and well-nourished.  HENT:  Head: Normocephalic and atraumatic.  Cardiovascular: Normal rate and regular rhythm.   No murmur heard. Pulmonary/Chest: Effort normal and breath sounds normal. No respiratory distress.  Tenderness to palpation over the left upper chest wall anteriorly and posteriorly. There are no palpable masses or deformities  Abdominal: Soft. There is no tenderness. There is no rebound and no guarding.  Musculoskeletal: She exhibits no edema or tenderness.  Neurological: She is alert and oriented to person, place, and time.  Skin: Skin is warm and dry.  Dry, patchy rash over bilateral upper extremities and lower abdomen.  Psychiatric: She has  a normal mood and affect. Her behavior is normal.  Nursing note and vitals reviewed.   ED Course  Procedures (including critical care time) Labs Review Labs Reviewed - No data to display  Imaging Review Dg Chest 2 View  08/24/2014   CLINICAL DATA:  Shortness of breath.  Left breast pain for 1 week  EXAM: CHEST  2 VIEW  COMPARISON:  05/21/2012  FINDINGS: Normal heart size and mediastinal contours. No acute infiltrate or edema. No effusion or pneumothorax. No acute osseous findings.  IMPRESSION: Negative chest.   Electronically Signed   By: Marnee Spring M.D.   On: 08/24/2014 22:21     EKG  Interpretation None      MDM   Final diagnoses:  Chest wall contusion, left, initial encounter    Patient here for evaluation of chest wall pain. History of presentation is not consistent with ACS, PE, dissection. There is no evidence of acute infectious process on exam. Discussed with patient and care for chest wall contusion with musculoskeletal pain. Recommend ibuprofen for pain control. Recommend PCP follow-up and return precautions. In terms of eczematous rash, prescribing triamcinolone cream for affected areas with outpatient follow-up.    Tilden Fossa, MD 08/24/14 2228

## 2014-09-13 ENCOUNTER — Emergency Department (HOSPITAL_COMMUNITY): Payer: Self-pay

## 2014-09-13 ENCOUNTER — Emergency Department (HOSPITAL_COMMUNITY)
Admission: EM | Admit: 2014-09-13 | Discharge: 2014-09-13 | Disposition: A | Discharge status: Home / Self Care | Payer: Self-pay | Attending: Emergency Medicine | Admitting: Emergency Medicine

## 2014-09-13 ENCOUNTER — Encounter (HOSPITAL_COMMUNITY): Payer: Self-pay | Admitting: Emergency Medicine

## 2014-09-13 DIAGNOSIS — H9203 Otalgia, bilateral: Secondary | ICD-10-CM | POA: Insufficient documentation

## 2014-09-13 DIAGNOSIS — R197 Diarrhea, unspecified: Secondary | ICD-10-CM | POA: Insufficient documentation

## 2014-09-13 DIAGNOSIS — E669 Obesity, unspecified: Secondary | ICD-10-CM | POA: Insufficient documentation

## 2014-09-13 DIAGNOSIS — Z3202 Encounter for pregnancy test, result negative: Secondary | ICD-10-CM | POA: Insufficient documentation

## 2014-09-13 DIAGNOSIS — Z91040 Latex allergy status: Secondary | ICD-10-CM | POA: Insufficient documentation

## 2014-09-13 DIAGNOSIS — J020 Streptococcal pharyngitis: Secondary | ICD-10-CM | POA: Insufficient documentation

## 2014-09-13 DIAGNOSIS — Z720 Tobacco use: Secondary | ICD-10-CM | POA: Insufficient documentation

## 2014-09-13 LAB — URINE MICROSCOPIC-ADD ON

## 2014-09-13 LAB — CBC WITH DIFFERENTIAL/PLATELET
Basophils Absolute: 0.1 10*3/uL (ref 0.0–0.1)
Basophils Relative: 1 % (ref 0–1)
EOS PCT: 2 % (ref 0–5)
Eosinophils Absolute: 0.2 10*3/uL (ref 0.0–0.7)
HEMATOCRIT: 39.6 % (ref 36.0–46.0)
HEMOGLOBIN: 13.2 g/dL (ref 12.0–15.0)
LYMPHS PCT: 27 % (ref 12–46)
Lymphs Abs: 2.9 10*3/uL (ref 0.7–4.0)
MCH: 28.3 pg (ref 26.0–34.0)
MCHC: 33.3 g/dL (ref 30.0–36.0)
MCV: 84.8 fL (ref 78.0–100.0)
MONO ABS: 1.5 10*3/uL — AB (ref 0.1–1.0)
MONOS PCT: 14 % — AB (ref 3–12)
Neutro Abs: 6.1 10*3/uL (ref 1.7–7.7)
Neutrophils Relative %: 56 % (ref 43–77)
Platelets: 296 10*3/uL (ref 150–400)
RBC: 4.67 MIL/uL (ref 3.87–5.11)
RDW: 13.8 % (ref 11.5–15.5)
WBC: 10.7 10*3/uL — AB (ref 4.0–10.5)

## 2014-09-13 LAB — I-STAT CHEM 8, ED
BUN: 13 mg/dL (ref 6–20)
CREATININE: 0.8 mg/dL (ref 0.44–1.00)
Calcium, Ion: 1.19 mmol/L (ref 1.12–1.23)
Chloride: 106 mmol/L (ref 101–111)
Glucose, Bld: 88 mg/dL (ref 70–99)
HEMATOCRIT: 42 % (ref 36.0–46.0)
HEMOGLOBIN: 14.3 g/dL (ref 12.0–15.0)
POTASSIUM: 4.1 mmol/L (ref 3.5–5.1)
Sodium: 141 mmol/L (ref 135–145)
TCO2: 21 mmol/L (ref 0–100)

## 2014-09-13 LAB — URINALYSIS, ROUTINE W REFLEX MICROSCOPIC
BILIRUBIN URINE: NEGATIVE
Glucose, UA: NEGATIVE mg/dL
Ketones, ur: NEGATIVE mg/dL
Leukocytes, UA: NEGATIVE
NITRITE: NEGATIVE
PH: 8 (ref 5.0–8.0)
PROTEIN: NEGATIVE mg/dL
Specific Gravity, Urine: 1.026 (ref 1.005–1.030)
UROBILINOGEN UA: 1 mg/dL (ref 0.0–1.0)

## 2014-09-13 LAB — RAPID STREP SCREEN (MED CTR MEBANE ONLY): STREPTOCOCCUS, GROUP A SCREEN (DIRECT): POSITIVE — AB

## 2014-09-13 LAB — POC URINE PREG, ED: Preg Test, Ur: NEGATIVE

## 2014-09-13 LAB — CBG MONITORING, ED: Glucose-Capillary: 95 mg/dL (ref 70–99)

## 2014-09-13 MED ORDER — PENICILLIN G BENZATHINE 1200000 UNIT/2ML IM SUSP
1.2000 10*6.[IU] | Freq: Once | INTRAMUSCULAR | Status: AC
  Administered 2014-09-13: 1.2 10*6.[IU] via INTRAMUSCULAR
  Filled 2014-09-13: qty 2

## 2014-09-13 NOTE — ED Provider Notes (Signed)
CSN: [id number]     Arrival date & time 09/13/14  1542 History   First MD Initiated Contact with Patient 09/13/14 1714     No chief complaint on file.    (Consider location/radiation/quality/duration/timing/severity/associated sxs/prior Treatment) HPI Comments: Patient presents with a constellation of symptoms that include nausea, vomiting (x 1), diarrhea today (x 4, non-bloody), sore throat, sinus pressure, cough productive of blood-tinged sputum, and bilateral (left greater than right) ear pain. No known fever. She denies known sick contacts, rash or other complaint.  The history is provided by the patient. No language interpreter was used.    Past Medical History  Diagnosis Date  . Obesity   . WUJWJXBJ(478.2Headache(784.0)    Past Surgical History  Procedure Laterality Date  . Cesarean section    . Sweat gland    . Orif ankle fracture Right 12/22/2012    Procedure: OPEN REDUCTION INTERNAL FIXATION (ORIF) ANKLE FRACTURE;  Surgeon: Mable ParisJustin William Chandler, MD;  Location: [facility];  Service: Orthopedics;  Laterality: Right;   No family history on file. History  Substance Use Topics  . Smoking status: Current Every Day Smoker -- 0.50 packs/day  . Smokeless tobacco: Not on file  . Alcohol Use: Yes     Comment: occ   OB History    No data available     Review of Systems  Constitutional: Negative for fever and chills.  HENT: Positive for ear pain, sinus pressure and sore throat.   Respiratory: Positive for cough.   Cardiovascular: Negative.  Negative for chest pain.  Gastrointestinal: Positive for nausea, vomiting and diarrhea. Negative for abdominal pain.  Genitourinary: Negative for menstrual problem.  Musculoskeletal: Negative.  Negative for myalgias and neck stiffness.  Skin: Negative.  Negative for rash.  Neurological: Negative.  Negative for weakness and headaches.      Allergies  Latex  Home Medications   Prior to Admission medications   Medication Sig  Start Date End Date Taking? Authorizing Provider  etonogestrel (IMPLANON) 68 MG IMPL implant Inject 1 each into the skin once. Implanted April 2014   Yes Historical Provider, MD  ibuprofen (ADVIL,MOTRIN) 800 MG tablet Take 1 tablet (800 mg total) by mouth 3 (three) times daily. Patient not taking: Reported on 09/13/2014 08/24/14   Tilden FossaElizabeth Rees, MD  loratadine (CLARITIN) 10 MG tablet Take 1 tablet (10 mg total) by mouth daily. Patient not taking: Reported on 09/13/2014 09/03/13   Earley FavorGail Schulz, NP  triamcinolone cream (KENALOG) 0.1 % Apply 1 application topically 2 (two) times daily. Patient not taking: Reported on 09/13/2014 08/24/14   Tilden FossaElizabeth Rees, MD   BP 105/67 mmHg  Pulse 75  Temp(Src) 98.6 F (37 C) (Oral)  Resp 18  Ht 5\' 9"  (1.753 m)  Wt 292 lb 6 oz (132.62 kg)  BMI 43.16 kg/m2  SpO2 100%  LMP 08/24/2014 Physical Exam  Constitutional: She is oriented to person, place, and time. She appears well-developed and well-nourished.  HENT:  Head: Normocephalic.  Right Ear: Tympanic membrane and external ear normal.  Left Ear: Tympanic membrane and external ear normal.  Nose: Mucosal edema present.  Mouth/Throat: Uvula is midline. Mucous membranes are not dry. Posterior oropharyngeal edema and posterior oropharyngeal erythema present. No oropharyngeal exudate.  Neck: Normal range of motion. Neck supple.  Cardiovascular: Normal rate and regular rhythm.   Pulmonary/Chest: Effort normal and breath sounds normal. She has no wheezes. She has no rales.  Abdominal: Soft. Bowel sounds are normal. There is no tenderness. There is no  rebound and no guarding.  Musculoskeletal: Normal range of motion.  Neurological: She is alert and oriented to person, place, and time.  Skin: Skin is warm and dry. No rash noted.  Psychiatric: She has a normal mood and affect.    ED Course  Procedures (including critical care time) Labs Review Labs Reviewed  RAPID STREP SCREEN - Abnormal; Notable for the following:     Streptococcus, Group A Screen (Direct) POSITIVE (*)    All other components within normal limits  CBC WITH DIFFERENTIAL/PLATELET - Abnormal; Notable for the following:    WBC 10.7 (*)    Monocytes Relative 14 (*)    Monocytes Absolute 1.5 (*)    All other components within normal limits  URINALYSIS, ROUTINE W REFLEX MICROSCOPIC - Abnormal; Notable for the following:    Hgb urine dipstick SMALL (*)    All other components within normal limits  URINE MICROSCOPIC-ADD ON - Abnormal; Notable for the following:    Squamous Epithelial / LPF FEW (*)    Bacteria, UA FEW (*)    All other components within normal limits  I-STAT CHEM 8, ED  POC URINE PREG, ED    Imaging Review Dg Chest 2 View  09/13/2014   CLINICAL DATA:  Cough and nausea  EXAM: CHEST  2 VIEW  COMPARISON:  August 24, 2014  FINDINGS: Lungs are clear. Heart size and pulmonary vascularity are normal. No adenopathy. No bone lesions.  IMPRESSION: No edema or consolidation.   Electronically Signed   By: Bretta BangWilliam  Woodruff III M.D.   On: 09/13/2014 16:24     EKG Interpretation None      MDM   Final diagnoses:  None    1. Strep throat  Well appearing patient with positive strep test. IM bicillin provided with recommendation on other supportive care and symptomatic treatment over the counter. PCP referrals provided.    Elpidio AnisShari Kohan Azizi, PA-C 09/13/14 1806  Mancel BaleElliott Wentz, MD 09/14/14 0110

## 2014-09-13 NOTE — ED Notes (Addendum)
Pt up and changed, stumbled back then slipped to floor breaking her fall.  MD notified.  PA into room.  Pt back to bed.  VSS.  Finger stick glucose 95.  Pt given drink and food.

## 2014-09-13 NOTE — Discharge Instructions (Signed)
TAKE TYLENOL AND/OR IBUPROFEN FOR ACHES AND IF ANY FEVER DEVELOPS. PUSH FLUIDS, REST. FOLLOW UP WITH A PRIMARY CARE PROVIDER OF YOUR CHOICE FOR FURTHER PRIMARY CARE ISSUES. RETURN TO THE EMERGENCY DEPARTMENT FOR MEDICAL CARE REQUIRING URGENT INTERVENTION.  Salt Water Gargle This solution will help make your mouth and throat feel better. HOME CARE INSTRUCTIONS   Mix 1 teaspoon of salt in 8 ounces of warm water.  Gargle with this solution as much or often as you need or as directed. Swish and gargle gently if you have any sores or wounds in your mouth.  Do not swallow this mixture. Document Released: 01/30/2004 Document Revised: 07/20/2011 Document Reviewed: 06/22/2008 [facility] Patient Information 2015 [place], [place]. This information is not intended to replace advice given to you by your health care provider. Make sure you discuss any questions you have with your health care provider. Strep Throat Strep throat is an infection of the throat caused by a bacteria named Streptococcus pyogenes. Your health care provider may call the infection streptococcal "tonsillitis" or "pharyngitis" depending on whether there are signs of inflammation in the tonsils or back of the throat. Strep throat is most common in children aged 5-15 years during the cold months of the year, but it can occur in people of any age during any season. This infection is spread from person to person (contagious) through coughing, sneezing, or other close contact. SIGNS AND SYMPTOMS   Fever or chills.  Painful, swollen, red tonsils or throat.  Pain or difficulty when swallowing.  White or yellow spots on the tonsils or throat.  Swollen, tender lymph nodes or "glands" of the neck or under the jaw.  Red rash all over the body (rare). DIAGNOSIS  Many different infections can cause the same symptoms. A test must be done to confirm the diagnosis so the right treatment can be given. A "rapid strep test" can help your health care  provider make the diagnosis in a few minutes. If this test is not available, a light swab of the infected area can be used for a throat culture test. If a throat culture test is done, results are usually available in a day or two. TREATMENT  Strep throat is treated with antibiotic medicine. HOME CARE INSTRUCTIONS   Gargle with 1 tsp of salt in 1 cup of warm water, 3-4 times per day or as needed for comfort.  Family members who also have a sore throat or fever should be tested for strep throat and treated with antibiotics if they have the strep infection.  Make sure everyone in your household washes their hands well.  Do not share food, drinking cups, or personal items that could cause the infection to spread to others.  You may need to eat a soft food diet until your sore throat gets better.  Drink enough water and fluids to keep your urine clear or pale yellow. This will help prevent dehydration.  Get plenty of rest.  Stay home from school, day care, or work until you have been on antibiotics for 24 hours.  Take medicines only as directed by your health care provider.  Take your antibiotic medicine as directed by your health care provider. Finish it even if you start to feel better. SEEK MEDICAL CARE IF:   The glands in your neck continue to enlarge.  You develop a rash, cough, or earache.  You cough up green, yellow-brown, or bloody sputum.  You have pain or discomfort not controlled by medicines.  Your problems  seem to be getting worse rather than better.  You have a fever. SEEK IMMEDIATE MEDICAL CARE IF:   You develop any new symptoms such as vomiting, severe headache, stiff or painful neck, chest pain, shortness of breath, or trouble swallowing.  You develop severe throat pain, drooling, or changes in your voice.  You develop swelling of the neck, or the skin on the neck becomes red and tender.  You develop signs of dehydration, such as fatigue, dry mouth, and  decreased urination.  You become increasingly sleepy, or you cannot wake up completely. MAKE SURE YOU:  Understand these instructions.  Will watch your condition.  Will get help right away if you are not doing well or get worse. Document Released: 04/24/2000 Document Revised: 09/11/2013 Document Reviewed: 06/26/2010 [facility] Patient Information 2015 [place], [place]. This information is not intended to replace advice given to you by your health care provider. Make sure you discuss any questions you have with your health care provider.  Emergency Department Resource Guide 1) Find a Doctor and Pay Out of Pocket Although you won't have to find out who is covered by your insurance plan, it is a good idea to ask around and get recommendations. You will then need to call the office and see if the doctor you have chosen will accept you as a new patient and what types of options they offer for patients who are self-pay. Some doctors offer discounts or will set up payment plans for their patients who do not have insurance, but you will need to ask so you aren't surprised when you get to your appointment.  2) Contact Your Local Health Department Not all health departments have doctors that can see patients for sick visits, but many do, so it is worth a call to see if yours does. If you don't know where your local health department is, you can check in your phone book. The CDC also has a tool to help you locate your state's health department, and many state websites also have listings of all of their local health departments.  3) Find a Walk-in Clinic If your illness is not likely to be very severe or complicated, you may want to try a walk in clinic. These are popping up all over the country in pharmacies, drugstores, and shopping centers. They're usually staffed by nurse practitioners or physician assistants that have been trained to treat common illnesses and complaints. They're usually fairly quick and  inexpensive. However, if you have serious medical issues or chronic medical problems, these are probably not your best option.  No Primary Care Doctor: - Call Health Connect at  [phone] - they can help you locate a primary care doctor that  accepts your insurance, provides certain services, etc. - Physician Referral Service- [phone]  Chronic Pain Problems: Organization         Address  Phone   Notes  [facility]  [phone] Patients need to be referred by their primary care doctor.   Medication Assistance: Organization         Address  Phone   Notes  [facility] Medication [facility] [address] [place] [place], [place] [address] [phone] --Must be a resident of [facility] -- Must have NO insurance coverage whatsoever (no Medicaid/ Medicare, etc.) -- The pt. MUST have a primary care doctor that directs their care regularly and follows them in the community   [facility]ist  [phone]   United Way  9151711443)  [phone]    Agencies that provide inexpensive medical care: Organization         Address  Phone   Notes  [facility]  [phone]   [facility]    [phone]   [facility] [address] [place], [place] [address] [phone]) [phone]   [facility] [address], [place] [phone]) [phone]   [facility]anned Parenthood    [phone]) [phone]   [facility]    [phone](336) [phone]   [facility] and [facility]  [address], [place] Phone:  [phone](336) [phone], Fax:  [phone] Hours of Operation:  9 am - 6 pm, M-F.  Also accepts Medicaid/Medicare and self-pay.  [facility]  [address], [place] Phone: [phone](336) [phone], Fax: [phone]) [phone]. Hours of Operation:  8:30 am - 5:30 pm, M-F.  Also accepts Medicaid and self-pay.  [facility] [place] [address], [place] Phone: [phone]) [phone]   [facility] [address], [place], [place] [phone], Ext. [phone] Mondays & Thursdays: 7-9 AM.  First 15 patients are seen on a first come, first serve basis.    Medicaid-accepting [facility] Providers:  Organization         Address  Phone   Notes  [facility] [address], [place] [phone]) [phone] Also accepts self-pay patients.  [facility] [address], [place]  [phone]) [phone]   [facility] [address], [place] [phone](336) [phone]   [facility] [address] [place] [address], [place] [phone]) [phone]   Renaye RakersVeita Bland [address], [place]   [phone] Only accepts [place] Access [place] patients after they have their name applied to their card.   Self-Pay (no insurance) in [facility]:  Organization         Address  Phone   Notes  Sickle Cell Patients, [facility] [address], [place] [phone]) [phone]   [facility] [address], [place] [phone](336) [phone]   [facility] [place]  [address], [place] [phone]) [phone]   [facility]/Dr. Osei-Bonsu  [address] [place] [address], [place] or [phone], [place] [phone] Phone number for both [place] and [place] locations is the same.  [facility] [address], [place] [phone]) [phone]   [facility] [place] [address] [place] [address], [place] or [address] [phone]) [phone](336) [phone]   [facility] [address], [place] [phone]) [phone], phone; [phone]) [phone], fax Sees patients 1st and 3rd Saturday of every month.  Must not qualify for public or private insurance (i.e. Medicaid, Medicare, [place] Health Choice, Veterans' Benefits)  Household income should be no more than 200% of the poverty level The clinic cannot treat you if you are pregnant or  think you are pregnant  Sexually transmitted diseases are not treated at the clinic.    Dental Care: Organization         Address  Phone  Notes  [facility] Department of [facility] [address], [place] [phone]) [phone] Accepts children up to age 26 who are enrolled in [place] or [place] Health Choice; pregnant women with a Medicaid card; and children who have  applied for Medicaid or [place] Health Choice, but were declined, whose parents can pay a reduced fee at time of service.  [facility] of [facility]: Lambert Rhonda W  [address], [place] [phone] Accepts children up to age 68 who are enrolled in [place] or [place] Health Choice; pregnant women with a Medicaid card; and children who have applied for Medicaid or [place] Health Choice, but were declined, whose parents can pay a reduced fee at time of service.  [facility]  [address] [place], [place] [phone] Patients are seen by appointment only. Walk-ins are not accepted. [facility] will see patients 39 years of age and older. Monday - Tuesday (8am-5pm) Most Wednesdays (8:30-5pm) $30 per visit, cash only  [facility] Adult Dental Access PROGRAM  [address], [facility] [phone] Patients are seen by appointment only. Walk-ins are not accepted. [facility] will see patients 51 years of age and older. One Wednesday Evening (Monthly: Volunteer Based).  $30 per visit, cash only  [employer]  [phone] for adults; Children under age 7, call Graduate Pediatric Dentistry at [phone]. Children aged 25-14, please call [phone] to request a pediatric application.  Dental services are provided in all areas of dental care including fillings, crowns and bridges, complete and partial dentures, implants, gum treatment, root canals, and extractions. Preventive care is also provided. Treatment is provided to both adults  and children. Patients are selected via a lottery and there is often a waiting list.   [facility] [address], [place]  [phone] [email or web address]   [facility] [address] [place], [place] [phone], Ext. [phone] Second and Fourth Thursday of each month, opens at 6:30 AM; Clinic ends at 9 AM.  Patients are seen on a first-come first-served basis, and a limited number are seen during each clinic.   [facility]  [address] [place], [place] [phone]   Eligibility Requirements You must have lived in [place], [place], or [place] counties for at least the last three months.   You cannot be eligible for state or federal sponsored [place], including [employer], [place], or [employer].   You generally cannot be eligible for healthcare insurance through your employer.    How to apply: Eligibility screenings are held every Tuesday and Wednesday afternoon from 1:00 pm until 4:00 pm. You do not need an appointment for the interview!  [facility] [address] [place], [place] [phone]   [facility] Health Department  [phone]   [facility] Department  [phone]   [facility] Health Department  [phone]    Behavioral Health Resources in the Community: Intensive Outpatient Programs Organization         Address  Phone  Notes  [facility] [address] [place], [place] [phone]   [facility] [address], [place], [place] [phone]   [facility]: [facility] [address], [place], [place]  [phone]   [facility] [address],  [place], [place] [phone] or [phone]   Substance Abuse Resources Organization         Address  Phone  Notes  [facility] Drug Services  [phone]   [facility]  [phone]   [facility] [place]  [phone]     [facility]  [phone]   Residential & Outpatient  Substance Abuse Program  [phone]   Psychological Services Organization         Address  Phone  Notes  [facility]  [phone]   [facility]  [phone]   [facility] [phone] [address] [place] [phone] or [phone]    Mobile Crisis Teams Organization         Address  Phone  Notes  [facility], Mobile Crisis Care Unit  [phone]   [facility]  [address] [place], [place] [phone]   Doristine Locks [address] [place] [phone]    Self-Help/Support Groups Organization         Address  Phone             Notes  [facility]ental [facility] Assoc[facility] of [place] - variety of support groups  [phone]- I7437963 Call for more information  Narcotics Anonymous (NA), [facility] [address], [employer] [place]  2 meetings at this location   Statistician         Address  Phone  Notes  [facility] [address],    [place]  [phone]   [facility]  [address] [place] [address], [place], [place] [phone]   [facility] [address] [place], [place] [phone] Admissions: 8am-3pm M-F  [facility] [address],    [place], [place] [phone]   [facility] [address] [place], [place], [place] [phone]   [facility] [address],  [place], [place] [phone]   [facility] - Intensive Outpatient [address], [place], [place] [phone]   [facility] ([facility] [address] [place]  [place], [place] [phone] or [phone]   [facility] ([facility]) [address] [place], [place] [phone] Accepts Medicaid  [facility] [place] [address]  [place] [phone] Substance Abuse/Addiction Treatment   [facility] Organization         Address  Phone  Notes  [facility]  [phone]   Angie Fava, PhD [address] [place], [place]   [phone] or [phone]   [facility]   [address] [place], [place] [phone]   [facility] [address], [place], [place] [phone] Insurance/Medicaid/sponsorship through [facility] and Families [address]                                    [place], [place] [phone] Therapy/tele-psych/case  [facility] [address], [place] [phone]    Dr. Lolly Mustache  [phone]   [facility]  [employer] [facility] Dept. 1) [address], [place] 2) [address], [place] 3)  [address], [place] [phone]  [phone]   [facility] Child Abuse Hotline 9394971698 or [phone] (After Hours)

## 2014-09-13 NOTE — ED Notes (Signed)
Pt improved.  Denies issues.  Ambulated in hall without difficulty.

## 2014-09-13 NOTE — ED Notes (Signed)
Pt c/o nausea, vomiting or diarrhea since yesterday.  Also c/o bil ear pain, sore throat and productive cough

## 2015-02-07 ENCOUNTER — Emergency Department (HOSPITAL_COMMUNITY)
Admission: EM | Admit: 2015-02-07 | Discharge: 2015-02-07 | Disposition: A | Discharge status: Home / Self Care | Payer: Self-pay | Attending: Emergency Medicine | Admitting: Emergency Medicine

## 2015-02-07 DIAGNOSIS — E669 Obesity, unspecified: Secondary | ICD-10-CM | POA: Insufficient documentation

## 2015-02-07 DIAGNOSIS — Z793 Long term (current) use of hormonal contraceptives: Secondary | ICD-10-CM | POA: Insufficient documentation

## 2015-02-07 DIAGNOSIS — Z91040 Latex allergy status: Secondary | ICD-10-CM | POA: Insufficient documentation

## 2015-02-07 DIAGNOSIS — L309 Dermatitis, unspecified: Secondary | ICD-10-CM

## 2015-02-07 DIAGNOSIS — Z720 Tobacco use: Secondary | ICD-10-CM | POA: Insufficient documentation

## 2015-02-07 DIAGNOSIS — L259 Unspecified contact dermatitis, unspecified cause: Secondary | ICD-10-CM | POA: Insufficient documentation

## 2015-02-07 MED ORDER — LORATADINE 10 MG PO TABS: 10.0000 mg | ORAL_TABLET | Freq: Every day | ORAL | Status: DC

## 2015-02-07 MED ORDER — HYDROCORTISONE 1 % EX CREA: TOPICAL_CREAM | CUTANEOUS | Status: DC

## 2015-02-07 NOTE — ED Provider Notes (Signed)
CSN: [id number]     Arrival date & time 02/07/15  1100 History  By signing my name below, I, Susan English, attest that this documentation has been prepared under the direction and in the presence of [employer], PA-C. Electronically Signed: Angelene Giovanni, ED Scribe. 02/07/2015. 12:11 PM.    Chief Complaint  Patient presents with  . Rash   The history is provided by the patient. No language interpreter was used.   HPI Comments:  Susan English is a 24 y.o. female who presents to the Emergency Department complaining of generalized gradually worsening and spreading dry, itchy rash on her arms, chest, and face onset 5 days ago. She denies that the rash is on her abdomen or legs. She also denies using any new lotions or detergent. She denies any past similar symptoms but per other chart, pt was seen 08/2014 with a similar presentation and prescribed Triamcinolone cream. 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 She is unsure of what could be causing her symptoms.   Past Medical History  Diagnosis Date  . Obesity   . [id number](478.2)    Past Surgical History  Procedure Laterality Date  . Cesarean section    . Sweat gland    . Orif ankle fracture Right 12/22/2012    Procedure: OPEN REDUCTION INTERNAL FIXATION (ORIF) ANKLE FRACTURE;  Surgeon: Mable Paris, MD;  [facility];  Service: Orthopedics;  Laterality: Right;   No family history on file. Social History  Substance Use Topics  . Smoking status: Current Every Day Smoker -- 0.50 packs/day  . Smokeless tobacco: Not on file  . Alcohol Use: Yes     Comment: occ   OB History    No data available     Review of Systems  Constitutional: Negative for fever.  Skin: Positive for rash.  All other systems reviewed and are negative.   Allergies  Latex  Home Medications   Prior to Admission medications   Medication Sig Start Date End Date  Taking? Authorizing Provider  etonogestrel (IMPLANON) 68 MG IMPL implant Inject 1 each into the skin once. Implanted April 2014    Historical Provider, MD  hydrocortisone cream 1 % Apply to affected area 2 times daily 02/07/15   Eyvonne Mechanic, PA-C  ibuprofen (ADVIL,MOTRIN) 800 MG tablet Take 1 tablet (800 mg total) by mouth 3 (three) times daily. Patient not taking: Reported on 09/13/2014 08/24/14   Tilden Fossa, MD  loratadine (CLARITIN) 10 MG tablet Take 1 tablet (10 mg total) by mouth daily. 02/07/15   Eyvonne Mechanic, PA-C  triamcinolone cream (KENALOG) 0.1 % Apply 1 application topically 2 (two) times daily. Patient not taking: Reported on 09/13/2014 08/24/14   Tilden Fossa, MD   BP 115/89 mmHg  Temp(Src) 98 F (36.7 C) (Oral)  Resp 18  Ht  (1.727 m)  Wt 280 lb (127.007 kg)  BMI 42.58 kg/m2  SpO2 99%  LMP 01/27/2015   Physical Exam  Constitutional: She is oriented to person, place, and time. She appears well-developed and well-nourished. No distress.  HENT:  Head: Normocephalic and atraumatic.  Eyes: Conjunctivae and EOM are normal.  Neck: Neck supple. No tracheal deviation present.  Cardiovascular: Normal rate.   Pulmonary/Chest: Effort normal. No respiratory distress.  Musculoskeletal: Normal range of motion.  Neurological: She is alert and oriented to person, place, and time.  Skin: Skin is warm and dry. Rash noted.  Rash to the forearms bilaterally, face,and chest.  Dried, excoriated on forearm, no infection.    Psychiatric: She has a normal mood and affect. Her behavior is normal.  Nursing note and vitals reviewed.   ED Course  Procedures (including critical care time) DIAGNOSTIC STUDIES: Oxygen Saturation is 99% on RA, normal by my interpretation.    COORDINATION OF CARE: 11:58 AM- Pt advised of plan for treatment and pt agrees.    Labs Review Labs Reviewed - No data to display  Imaging Review No results found. I have personally reviewed and evaluated  these images and lab results as part of my medical decision-making.   EKG Interpretation None      MDM   Final diagnoses:  Dermatitis  Labs:  Imaging:  Consults:  Therapeutics:  Discharge Meds: Hydrocortisone, Claritin  Assessment/Plan: Patient presents with likely contact dermatitis. Rash is only on exposed area including forearms chest and face, appears to be dry and excoriated. No signs of infection at this time. Patient will be given hydrocortisone cream, encouraged only use on the forearms and chest, Claritin, follow-up with primary care for reevaluation. Strict return precautions given, she verbalized understanding and agreement for today's plan.     I personally performed the services described in this documentation, which was scribed in my presence. The recorded information has been reviewed and is accurate.    Eyvonne Mechanic, PA-C 02/07/15 1644  Gilda Crease, MD 02/08/15 [phone]

## 2015-02-07 NOTE — Discharge Instructions (Signed)
Contact Dermatitis °Contact dermatitis is a reaction to certain substances that touch the skin. Contact dermatitis can be either irritant contact dermatitis or allergic contact dermatitis. Irritant contact dermatitis does not require previous exposure to the substance for a reaction to occur. Allergic contact dermatitis only occurs if you have been exposed to the substance before. Upon a repeat exposure, your body reacts to the substance.  °CAUSES  °Many substances can cause contact dermatitis. Irritant dermatitis is most commonly caused by repeated exposure to mildly irritating substances, such as: °· Makeup. °· Soaps. °· Detergents. °· Bleaches. °· Acids. °· Metal salts, such as nickel. °Allergic contact dermatitis is most commonly caused by exposure to: °· Poisonous plants. °· Chemicals (deodorants, shampoos). °· Jewelry. °· Latex. °· Neomycin in triple antibiotic cream. °· Preservatives in products, including clothing. °SYMPTOMS  °The area of skin that is exposed may develop: °· Dryness or flaking. °· Redness. °· Cracks. °· Itching. °· Pain or a burning sensation. °· Blisters. °With allergic contact dermatitis, there may also be swelling in areas such as the eyelids, mouth, or genitals.  °DIAGNOSIS  °Your caregiver can usually tell what the problem is by doing a physical exam. In cases where the cause is uncertain and an allergic contact dermatitis is suspected, a patch skin test may be performed to help determine the cause of your dermatitis. °TREATMENT °Treatment includes protecting the skin from further contact with the irritating substance by avoiding that substance if possible. Barrier creams, powders, and gloves may be helpful. Your caregiver may also recommend: °· Steroid creams or ointments applied 2 times daily. For best results, soak the rash area in cool water for 20 minutes. Then apply the medicine. Cover the area with a plastic wrap. You can store the steroid cream in the refrigerator for a "chilly"  effect on your rash. That may decrease itching. Oral steroid medicines may be needed in more severe cases. °· Antibiotics or antibacterial ointments if a skin infection is present. °· Antihistamine lotion or an antihistamine taken by mouth to ease itching. °· Lubricants to keep moisture in your skin. °· Burow's solution to reduce redness and soreness or to dry a weeping rash. Mix one packet or tablet of solution in 2 cups cool water. Dip a clean washcloth in the mixture, wring it out a bit, and put it on the affected area. Leave the cloth in place for 30 minutes. Do this as often as possible throughout the day. °· Taking several cornstarch or baking soda baths daily if the area is too large to cover with a washcloth. °Harsh chemicals, such as alkalis or acids, can cause skin damage that is like a burn. You should flush your skin for 15 to 20 minutes with cold water after such an exposure. You should also seek immediate medical care after exposure. Bandages (dressings), antibiotics, and pain medicine may be needed for severely irritated skin.  °HOME CARE INSTRUCTIONS °· Avoid the substance that caused your reaction. °· Keep the area of skin that is affected away from hot water, soap, sunlight, chemicals, acidic substances, or anything else that would irritate your skin. °· Do not scratch the rash. Scratching may cause the rash to become infected. °· You may take cool baths to help stop the itching. °· Only take over-the-counter or prescription medicines as directed by your caregiver. °· See your caregiver for follow-up care as directed to make sure your skin is healing properly. °SEEK MEDICAL CARE IF:  °· Your condition is not better after 3   days of treatment.  You seem to be getting worse.  You see signs of infection such as swelling, tenderness, redness, soreness, or warmth in the affected area.  You have any problems related to your medicines. Document Released: 04/24/2000 Document Revised: 07/20/2011  Document Reviewed: 09/30/2010 [facility] Patient Information 2015 [place], [place]. This information is not intended to replace advice given to you by your health care provider. Make sure you discuss any questions you have with your health care provider.  Please use medication only as directed, please avoid easing cream on the face. Please follow-up with your primary care provider in 3 days for reevaluation.

## 2015-02-07 NOTE — ED Notes (Signed)
C/o generalized dry, itching rash--onset 5 days ago. Today, has spread to face.

## 2015-02-20 ENCOUNTER — Emergency Department (HOSPITAL_COMMUNITY)
Admission: EM | Admit: 2015-02-20 | Discharge: 2015-02-20 | Disposition: A | Discharge status: Home / Self Care | Payer: Self-pay | Attending: Emergency Medicine | Admitting: Emergency Medicine

## 2015-02-20 ENCOUNTER — Encounter (HOSPITAL_COMMUNITY): Payer: Self-pay | Admitting: Family Medicine

## 2015-02-20 ENCOUNTER — Emergency Department (HOSPITAL_COMMUNITY): Payer: Self-pay

## 2015-02-20 DIAGNOSIS — M25561 Pain in right knee: Secondary | ICD-10-CM | POA: Insufficient documentation

## 2015-02-20 DIAGNOSIS — E669 Obesity, unspecified: Secondary | ICD-10-CM | POA: Insufficient documentation

## 2015-02-20 DIAGNOSIS — Z91040 Latex allergy status: Secondary | ICD-10-CM | POA: Insufficient documentation

## 2015-02-20 DIAGNOSIS — Z720 Tobacco use: Secondary | ICD-10-CM | POA: Insufficient documentation

## 2015-02-20 DIAGNOSIS — Z79899 Other long term (current) drug therapy: Secondary | ICD-10-CM | POA: Insufficient documentation

## 2015-02-20 MED ORDER — NAPROXEN 500 MG PO TABS: 500.0000 mg | ORAL_TABLET | Freq: Two times a day (BID) | ORAL | Status: DC

## 2015-02-20 MED ORDER — NAPROXEN 250 MG PO TABS
500.0000 mg | ORAL_TABLET | Freq: Once | ORAL | Status: AC
Start: 2015-02-20 — End: 2015-02-20
  Administered 2015-02-20: 500 mg via ORAL
  Filled 2015-02-20: qty 2

## 2015-02-20 NOTE — ED Provider Notes (Signed)
CSN: [id number]     Arrival date & time 02/20/15  1731 History  By signing my name below, I, Budd PalmerVanessa Prueter, attest that this documentation has been prepared under the direction and in the presence of [employer]ana Kaeden Mester, PA-C. Electronically Signed: Budd PalmerVanessa Prueter, ED Scribe. 02/20/2015. 6:51 PM.     Chief Complaint  Patient presents with  . Knee Pain   The history is provided by the patient. No language interpreter was used.   HPI Comments: Susan Evenershley C English is a 24 y.o. female who presents to the Emergency Department complaining of right knee pain onset 2 days ago. She reports associated swelling to the joint. She denies recent trauma or injury. She states she is on her feet and walks a lot. She states she has tried taking ibuprofen with mild relief. She notes a PSHx of right ankle surgery (2014), stating that she has a plate and six screws in the joint.  Past Medical History  Diagnosis Date  . Obesity   . NWGNFAOZ(308.6Headache(784.0)    Past Surgical History  Procedure Laterality Date  . Cesarean section    . Sweat gland    . Orif ankle fracture Right 12/22/2012    Procedure: OPEN REDUCTION INTERNAL FIXATION (ORIF) ANKLE FRACTURE;  Surgeon: Mable ParisJustin William Chandler, MD;  Location: [facility];  Service: Orthopedics;  Laterality: Right;   History reviewed. No pertinent family history. Social History  Substance Use Topics  . Smoking status: Current Every Day Smoker -- 0.50 packs/day  . Smokeless tobacco: None  . Alcohol Use: Yes     Comment: occ   OB History    No data available     Review of Systems  Constitutional: Negative for fever.  Musculoskeletal: Positive for joint swelling and arthralgias.  Skin: Negative for wound.    Allergies  Latex  Home Medications   Prior to Admission medications   Medication Sig Start Date End Date Taking? Authorizing Provider  etonogestrel (IMPLANON) 68 MG IMPL implant Inject 1 each into the skin once. Implanted April 2014     Historical Provider, MD  hydrocortisone cream 1 % Apply to affected area 2 times daily 02/07/15   Eyvonne MechanicJeffrey Hedges, PA-C  ibuprofen (ADVIL,MOTRIN) 800 MG tablet Take 1 tablet (800 mg total) by mouth 3 (three) times daily. Patient not taking: Reported on 09/13/2014 08/24/14   Tilden FossaElizabeth Rees, MD  loratadine (CLARITIN) 10 MG tablet Take 1 tablet (10 mg total) by mouth daily. 02/07/15   Eyvonne MechanicJeffrey Hedges, PA-C  triamcinolone cream (KENALOG) 0.1 % Apply 1 application topically 2 (two) times daily. Patient not taking: Reported on 09/13/2014 08/24/14   Tilden FossaElizabeth Rees, MD   BP 124/66 mmHg  Pulse 81  Temp(Src) 98.9 F (37.2 C) (Oral)  Resp 14  SpO2 99%  LMP 01/27/2015 Physical Exam  Constitutional: She is oriented to person, place, and time. She appears well-developed and well-nourished.  HENT:  Head: Normocephalic.  Eyes: EOM are normal.  Neck: Normal range of motion.  Pulmonary/Chest: Effort normal.  Abdominal: She exhibits no distension.  Musculoskeletal: Normal range of motion.  Normal-appearing right knee. No tenderness to palpation over anterior knee, bilateral joint, posterior knee. Full range of motion. Negative anterior-posterior drawer signs. No laxity with medial lateral stress. dp pulses intact  Neurological: She is alert and oriented to person, place, and time.  Skin: Skin is warm and dry.  Psychiatric: She has a normal mood and affect.  Nursing note and vitals reviewed.   ED Course  Procedures  DIAGNOSTIC  STUDIES: Oxygen Saturation is 99% on RA, normal by my interpretation.    COORDINATION OF CARE: 6:50 PM - Discussed XR results. Discussed probable tendonitis. Discussed plans to discharge, give a note for work, and to apply an ace bandage wrap. Advised to f/u with an orthopedist if the pain persists or worsens. Pt advised of plan for treatment and pt agrees.  Labs Review Labs Reviewed - No data to display  Imaging Review Dg Knee Complete 4 Views Right  02/20/2015  CLINICAL DATA:   Pain with flexion and with weight-bearing for 2 days. No history of trauma. EXAM: RIGHT KNEE - COMPLETE 4+ VIEW COMPARISON:  None. FINDINGS: Frontal, lateral, and bilateral oblique views were obtained. There is a sizable joint effusion. There is no demonstrable fracture or dislocation. Joint spaces appear intact. No erosive change. IMPRESSION: Sizable joint effusion. No fracture. No appreciable joint space narrowing or erosion. Electronically Signed   By: Bretta Bang III M.D.   On: 02/20/2015 18:30   I have personally reviewed and evaluated these images and lab results as part of my medical decision-making.   EKG Interpretation None      MDM   Final diagnoses:  Right knee pain    patient with atraumatic right knee pain. Patient states that she has been walking a lot lately, states she does not have a car and has had to walk to work. Patient also works standing up all shift. She has right knee pain, denies any prior knee injuries or issues. X-ray showing joint effusion. There is no evidence of infection. Patient is a afebrile, nontoxic appearing. Knee joint is stable. Home with Ace wrap, ice, elevate, follow up with orthopedics. Patient also instructed to work on losing some weight. Patient states she has been followed by Dr. Ave Filter with orthopedics, will refer her back to him.  Filed Vitals:   02/20/15 1741  BP: 124/66  Pulse: 81  Temp: 98.9 F (37.2 C)  TempSrc: Oral  Resp: 14  SpO2: 99%   I personally performed the services described in this documentation, which was scribed in my presence. The recorded information has been reviewed and is accurate.   Jaynie Crumble, PA-C 02/20/15 1857  Lorre Nick, MD 02/20/15 [phone]

## 2015-02-20 NOTE — ED Notes (Signed)
Pt here for right knee pain and swelling. Denies injury. sts she has plates and screws in the leg.

## 2015-02-20 NOTE — Discharge Instructions (Signed)
Ice and elevate your knee. Take naproxen as prescribed for pain. Ace wrap for swelling. Follow-up with orthopedic specialist.Knee Pain Knee pain is a very common symptom and can have many causes. Knee pain often goes away when you follow your health care provider's instructions for relieving pain and discomfort at home. However, knee pain can develop into a condition that needs treatment. Some conditions may include:  Arthritis caused by wear and tear (osteoarthritis).  Arthritis caused by swelling and irritation (rheumatoid arthritis or gout).  A cyst or growth in your knee.  An infection in your knee joint.  An injury that will not heal.  Damage, swelling, or irritation of the tissues that support your knee (torn ligaments or tendinitis). If your knee pain continues, additional tests may be ordered to diagnose your condition. Tests may include X-rays or other imaging studies of your knee. You may also need to have fluid removed from your knee. Treatment for ongoing knee pain depends on the cause, but treatment may include:  Medicines to relieve pain or swelling.  Steroid injections in your knee.  Physical therapy.  Surgery. HOME CARE INSTRUCTIONS  Take medicines only as directed by your health care provider.  Rest your knee and keep it raised (elevated) while you are resting.  Do not do things that cause or worsen pain.  Avoid high-impact activities or exercises, such as running, jumping rope, or doing jumping jacks.  Apply ice to the knee area:  Put ice in a plastic bag.  Place a towel between your skin and the bag.  Leave the ice on for 20 minutes, 2-3 times a day.  Ask your health care provider if you should wear an elastic knee support.  Keep a pillow under your knee when you sleep.  Lose weight if you are overweight. Extra weight can put pressure on your knee.  Do not use any tobacco products, including cigarettes, chewing tobacco, or electronic cigarettes. If  you need help quitting, ask your health care provider. Smoking may slow the healing of any bone and joint problems that you may have. SEEK MEDICAL CARE IF:  Your knee pain continues, changes, or gets worse.  You have a fever along with knee pain.  Your knee buckles or locks up.  Your knee becomes more swollen. SEEK IMMEDIATE MEDICAL CARE IF:   Your knee joint feels hot to the touch.  You have chest pain or trouble breathing.   This information is not intended to replace advice given to you by your health care provider. Make sure you discuss any questions you have with your health care provider.   Document Released: 02/22/2007 Document Revised: 05/18/2014 Document Reviewed: 12/11/2013 Elsevier Interactive Patient Education [employer]2016 Elsevier Inc.

## 2015-02-27 ENCOUNTER — Ambulatory Visit: Payer: Self-pay | Admitting: Family Medicine

## 2015-07-16 ENCOUNTER — Encounter (HOSPITAL_COMMUNITY): Payer: Self-pay | Admitting: Cardiology

## 2015-07-16 ENCOUNTER — Emergency Department (HOSPITAL_COMMUNITY)
Admission: EM | Admit: 2015-07-16 | Discharge: 2015-07-16 | Disposition: A | Discharge status: Home / Self Care | Payer: Self-pay | Attending: Emergency Medicine | Admitting: Emergency Medicine

## 2015-07-16 ENCOUNTER — Emergency Department (HOSPITAL_COMMUNITY): Payer: Self-pay

## 2015-07-16 DIAGNOSIS — F17200 Nicotine dependence, unspecified, uncomplicated: Secondary | ICD-10-CM | POA: Insufficient documentation

## 2015-07-16 DIAGNOSIS — Z79899 Other long term (current) drug therapy: Secondary | ICD-10-CM | POA: Insufficient documentation

## 2015-07-16 DIAGNOSIS — M7989 Other specified soft tissue disorders: Secondary | ICD-10-CM | POA: Insufficient documentation

## 2015-07-16 DIAGNOSIS — Z791 Long term (current) use of non-steroidal anti-inflammatories (NSAID): Secondary | ICD-10-CM | POA: Insufficient documentation

## 2015-07-16 DIAGNOSIS — Z91040 Latex allergy status: Secondary | ICD-10-CM | POA: Insufficient documentation

## 2015-07-16 DIAGNOSIS — Z7952 Long term (current) use of systemic steroids: Secondary | ICD-10-CM | POA: Insufficient documentation

## 2015-07-16 DIAGNOSIS — M25561 Pain in right knee: Secondary | ICD-10-CM | POA: Insufficient documentation

## 2015-07-16 MED ORDER — IBUPROFEN 400 MG PO TABS
800.0000 mg | ORAL_TABLET | Freq: Once | ORAL | Status: AC
  Administered 2015-07-16: 800 mg via ORAL
  Filled 2015-07-16: qty 2

## 2015-07-16 MED ORDER — IBUPROFEN 600 MG PO TABS: 600.0000 mg | ORAL_TABLET | Freq: Four times a day (QID) | ORAL | Status: DC | PRN

## 2015-07-16 NOTE — ED Provider Notes (Signed)
History  By signing my name below, I, Susan English, attest that this documentation has been prepared under the direction and in the presence of [employer], PA-C. Electronically Signed: Karle English, ED Scribe. 07/16/2015. 1:29 PM.  Chief Complaint  Patient presents with  . Knee Pain   The history is provided by the patient and medical records. No language interpreter was used.    HPI Comments:  Susan English is a 25 y.o. morbidly obese female who presents to the Emergency Department complaining of moderate right knee pain that began yesterday. She reports associated swelling. She has not taken anything for pain. Touching the area or bearing weight increases the pain. Resting helps alleviate the pain. She denies numbness, tingling or weakness of the RLE, bruising, wounds, fever, chills, nausea and vomiting. She denies trauma, injury or fall. She denies any past knee surgeries.  Past Medical History  Diagnosis Date  . Obesity   . ZOXWRUEA(540.9)    Past Surgical History  Procedure Laterality Date  . Cesarean section    . Sweat gland    . Orif ankle fracture Right 12/22/2012    Procedure: OPEN REDUCTION INTERNAL FIXATION (ORIF) ANKLE FRACTURE;  Surgeon: Mable Paris, MD;  Location: [facility];  Service: Orthopedics;  Laterality: Right;   History reviewed. No pertinent family history. Social History  Substance Use Topics  . Smoking status: Current Every Day Smoker -- 0.50 packs/day  . Smokeless tobacco: None  . Alcohol Use: Yes     Comment: occ   OB History    No data available     Review of Systems  Constitutional: Negative for fever and chills.  Gastrointestinal: Negative for nausea and vomiting.  Musculoskeletal: Positive for joint swelling and arthralgias.  Skin: Negative for color change and wound.  Neurological: Negative for weakness and numbness.    Allergies  Latex  Home Medications   Prior to Admission medications    Medication Sig Start Date End Date Taking? Authorizing Provider  etonogestrel (IMPLANON) 68 MG IMPL implant Inject 1 each into the skin once. Implanted April 2014    Historical Provider, MD  hydrocortisone cream 1 % Apply to affected area 2 times daily 02/07/15   Eyvonne Mechanic, PA-C  ibuprofen (ADVIL,MOTRIN) 600 MG tablet Take 1 tablet (600 mg total) by mouth every 6 (six) hours as needed. 07/16/15   Nea Gittens Patel-Mills, PA-C  loratadine (CLARITIN) 10 MG tablet Take 1 tablet (10 mg total) by mouth daily. 02/07/15   Eyvonne Mechanic, PA-C  naproxen (NAPROSYN) 500 MG tablet Take 1 tablet (500 mg total) by mouth 2 (two) times daily. 02/20/15   Tatyana Kirichenko, PA-C  triamcinolone cream (KENALOG) 0.1 % Apply 1 application topically 2 (two) times daily. Patient not taking: Reported on 09/13/2014 08/24/14   Tilden Fossa, MD   Triage Vitals: BP 120/73 mmHg  Pulse 62  Temp(Src) 98.1 F (36.7 C) (Oral)  Resp 18  Ht  (1.727 m)  Wt 280 lb (127.007 kg)  BMI 42.58 kg/m2  SpO2 99%  LMP 07/10/2015 Physical Exam  Constitutional: She is oriented to person, place, and time. She appears well-developed and well-nourished.  Obese  HENT:  Head: Normocephalic and atraumatic.  Eyes: EOM are normal.  Neck: Normal range of motion.  Cardiovascular: Normal rate.   Pulmonary/Chest: Effort normal.  Musculoskeletal: Normal range of motion. She exhibits tenderness.  Able to flex and extend right knee. Ambulatory. Able to SLR. Tenderness above the patellar. No fibular head or patella tenderness.  Neurological: She is alert and oriented to person, place, and time.  Skin: Skin is warm and dry.  Psychiatric: She has a normal mood and affect. Her behavior is normal.  Nursing note and vitals reviewed.   ED Course  Procedures (including critical care time) DIAGNOSTIC STUDIES: Oxygen Saturation is 99% on RA, normal by my interpretation.   COORDINATION OF CARE: 1:26 PM- Will order Ibuprofen, ace wrap and give  work note. Encouraged pt to RICE right knee. Pt verbalizes understanding and agrees to plan.  Medications  ibuprofen (ADVIL,MOTRIN) tablet 800 mg (800 mg Oral Given 07/16/15 1339)   Imaging Review Dg Knee Complete 4 Views Right  07/16/2015  CLINICAL DATA:  Anterior knee pain for 2 days.  No known injury. EXAM: RIGHT KNEE - COMPLETE 4+ VIEW COMPARISON:  02/20/2015. FINDINGS: The mineralization and alignment are normal. There is no evidence of acute fracture or dislocation. Medial compartment degenerative changes appear mildly progressive. The previously demonstrated joint effusion appears smaller, now moderate. No loose bodies or foreign bodies identified. IMPRESSION: No acute osseous findings. Previously demonstrated joint effusion appears slightly smaller. Mildly progressive medial compartment osteophytes. Electronically Signed   By: Carey BullocksWilliam  Veazey M.D.   On: 07/16/2015 12:53   I have personally reviewed and evaluated these image results as part of my medical decision-making.   MDM   Final diagnoses:  Right knee pain  Patient has right knee pain but no injury. Most likely due to obesity. I gave the patient orthopedic follow-up. She was given ibuprofen for pain. Return precautions discussed as well as elevation of the knee secondary to joint effusion on x-ray. X-ray also shows mildly progressive medial compartmental osteophytes.  I personally performed the services described in this documentation, which was scribed in my presence. The recorded information has been reviewed and is accurate.     Catha GosselinHanna Patel-Mills, PA-C 07/16/15 1410  Mancel BaleElliott Wentz, MD 07/16/15 1719

## 2015-07-16 NOTE — ED Notes (Signed)
Reports right knee pain for the past 2 days. Was sent home from work today

## 2015-07-16 NOTE — ED Notes (Signed)
RIGHT KNEE PAIN. NO KNOWN INJURY. HX OF RIGHT ANKLE FX WITH ORIF IN 2015. NO PREVIOUS KNEE INJURY.

## 2015-07-16 NOTE — Discharge Instructions (Signed)
Knee Pain Follow up with orthopedics if you continue to have knee pain after a week. Take ibuprofen for pain. Knee pain is a very common symptom and can have many causes. Knee pain often goes away when you follow your health care provider's instructions for relieving pain and discomfort at home. However, knee pain can develop into a condition that needs treatment. Some conditions may include:  Arthritis caused by wear and tear (osteoarthritis).  Arthritis caused by swelling and irritation (rheumatoid arthritis or gout).  A cyst or growth in your knee.  An infection in your knee joint.  An injury that will not heal.  Damage, swelling, or irritation of the tissues that support your knee (torn ligaments or tendinitis). If your knee pain continues, additional tests may be ordered to diagnose your condition. Tests may include X-rays or other imaging studies of your knee. You may also need to have fluid removed from your knee. Treatment for ongoing knee pain depends on the cause, but treatment may include:  Medicines to relieve pain or swelling.  Steroid injections in your knee.  Physical therapy.  Surgery. HOME CARE INSTRUCTIONS  Take medicines only as directed by your health care provider.  Rest your knee and keep it raised (elevated) while you are resting.  Do not do things that cause or worsen pain.  Avoid high-impact activities or exercises, such as running, jumping rope, or doing jumping jacks.  Apply ice to the knee area:  Put ice in a plastic bag.  Place a towel between your skin and the bag.  Leave the ice on for 20 minutes, 2-3 times a day.  Ask your health care provider if you should wear an elastic knee support.  Keep a pillow under your knee when you sleep.  Lose weight if you are overweight. Extra weight can put pressure on your knee.  Do not use any tobacco products, including cigarettes, chewing tobacco, or electronic cigarettes. If you need help quitting,  ask your health care provider. Smoking may slow the healing of any bone and joint problems that you may have. SEEK MEDICAL CARE IF:  Your knee pain continues, changes, or gets worse.  You have a fever along with knee pain.  Your knee buckles or locks up.  Your knee becomes more swollen. SEEK IMMEDIATE MEDICAL CARE IF:   Your knee joint feels hot to the touch.  You have chest pain or trouble breathing.   This information is not intended to replace advice given to you by your health care provider. Make sure you discuss any questions you have with your health care provider.   Document Released: 02/22/2007 Document Revised: 05/18/2014 Document Reviewed: 12/11/2013 Elsevier Interactive Patient Education [employer] Elsevier Inc.

## 2015-12-06 ENCOUNTER — Telehealth (HOSPITAL_COMMUNITY): Payer: Self-pay | Admitting: *Deleted

## 2015-12-06 NOTE — Telephone Encounter (Signed)
Patient referred to [facility] by the [facility] due to having an abnormal Pap smear that a colposcopy is recommended for follow up. Called patient to explain the [facility] program and verify eligibility. Scheduled appointment with [facility] for Thursday, December 19, 2015 at 0745. Patient verbalized understanding.

## 2015-12-16 ENCOUNTER — Encounter (HOSPITAL_COMMUNITY): Payer: Self-pay | Admitting: *Deleted

## 2015-12-18 ENCOUNTER — Other Ambulatory Visit (HOSPITAL_COMMUNITY): Payer: Self-pay | Admitting: *Deleted

## 2015-12-18 ENCOUNTER — Telehealth (HOSPITAL_COMMUNITY): Payer: Self-pay | Admitting: *Deleted

## 2015-12-18 DIAGNOSIS — N6310 Unspecified lump in the right breast, unspecified quadrant: Secondary | ICD-10-CM

## 2015-12-18 DIAGNOSIS — N6320 Unspecified lump in the left breast, unspecified quadrant: Principal | ICD-10-CM

## 2015-12-18 NOTE — Telephone Encounter (Signed)
[facility] patient at home # and [facility]

## 2015-12-19 ENCOUNTER — Other Ambulatory Visit: Payer: Self-pay

## 2015-12-19 ENCOUNTER — Ambulatory Visit (HOSPITAL_COMMUNITY): Payer: Self-pay

## 2015-12-27 ENCOUNTER — Encounter: Payer: Self-pay | Admitting: *Deleted

## 2016-01-08 ENCOUNTER — Telehealth (HOSPITAL_COMMUNITY): Payer: Self-pay | Admitting: *Deleted

## 2016-01-08 NOTE — Telephone Encounter (Signed)
Telephoned patient at home number and left message about [facility] appointment for Thursday August 31 3:30.

## 2016-01-09 ENCOUNTER — Ambulatory Visit (HOSPITAL_COMMUNITY): Payer: Self-pay

## 2016-01-09 ENCOUNTER — Other Ambulatory Visit: Payer: Self-pay

## 2016-01-16 ENCOUNTER — Encounter: Payer: Self-pay | Admitting: Obstetrics and Gynecology

## 2016-01-24 ENCOUNTER — Encounter (HOSPITAL_COMMUNITY): Payer: Self-pay

## 2016-01-24 ENCOUNTER — Emergency Department (HOSPITAL_COMMUNITY): Payer: Self-pay

## 2016-01-24 ENCOUNTER — Emergency Department (HOSPITAL_COMMUNITY)
Admission: EM | Admit: 2016-01-24 | Discharge: 2016-01-24 | Disposition: A | Discharge status: Home / Self Care | Payer: Self-pay | Attending: Emergency Medicine | Admitting: Emergency Medicine

## 2016-01-24 DIAGNOSIS — J069 Acute upper respiratory infection, unspecified: Secondary | ICD-10-CM | POA: Insufficient documentation

## 2016-01-24 DIAGNOSIS — Z91040 Latex allergy status: Secondary | ICD-10-CM | POA: Insufficient documentation

## 2016-01-24 DIAGNOSIS — F17200 Nicotine dependence, unspecified, uncomplicated: Secondary | ICD-10-CM | POA: Insufficient documentation

## 2016-01-24 DIAGNOSIS — B349 Viral infection, unspecified: Secondary | ICD-10-CM | POA: Insufficient documentation

## 2016-01-24 LAB — COMPREHENSIVE METABOLIC PANEL
ALBUMIN: 3.7 g/dL (ref 3.5–5.0)
ALK PHOS: 76 U/L (ref 38–126)
ALT: 13 U/L — ABNORMAL LOW (ref 14–54)
ANION GAP: 6 (ref 5–15)
AST: 15 U/L (ref 15–41)
BILIRUBIN TOTAL: 0.4 mg/dL (ref 0.3–1.2)
BUN: 14 mg/dL (ref 6–20)
CALCIUM: 9.2 mg/dL (ref 8.9–10.3)
CHLORIDE: 106 mmol/L (ref 101–111)
CO2: 24 mmol/L (ref 22–32)
Creatinine, Ser: 0.89 mg/dL (ref 0.44–1.00)
GFR calc Af Amer: >60 mL/min (ref >60)
GFR calc non Af Amer: >60 mL/min (ref >60)
GLUCOSE: 82 mg/dL (ref 65–99)
POTASSIUM: 4.5 mmol/L (ref 3.5–5.1)
Sodium: 136 mmol/L (ref 135–145)
Total Protein: 7.5 g/dL (ref 6.5–8.1)

## 2016-01-24 LAB — CBC WITH DIFFERENTIAL/PLATELET
Basophils Absolute: 0.1 10*3/uL (ref 0.0–0.1)
Basophils Relative: 1 %
Eosinophils Absolute: 0.3 10*3/uL (ref 0.0–0.7)
Eosinophils Relative: 3 %
HEMATOCRIT: 40.6 % (ref 36.0–46.0)
HEMOGLOBIN: 13.1 g/dL (ref 12.0–15.0)
LYMPHS ABS: 2.7 10*3/uL (ref 0.7–4.0)
LYMPHS PCT: 29 %
MCH: 28.9 pg (ref 26.0–34.0)
MCHC: 32.3 g/dL (ref 30.0–36.0)
MCV: 89.6 fL (ref 78.0–100.0)
MONOS PCT: 15 %
Monocytes Absolute: 1.3 10*3/uL — ABNORMAL HIGH (ref 0.1–1.0)
NEUTROS ABS: 4.8 10*3/uL (ref 1.7–7.7)
NEUTROS PCT: 52 %
Platelets: 272 10*3/uL (ref 150–400)
RBC: 4.53 MIL/uL (ref 3.87–5.11)
RDW: 14 % (ref 11.5–15.5)
WBC: 9.1 10*3/uL (ref 4.0–10.5)

## 2016-01-24 LAB — I-STAT BETA HCG BLOOD, ED (MC, WL, AP ONLY): I-stat hCG, quantitative: <5 m[IU]/mL (ref <5)

## 2016-01-24 LAB — LIPASE, BLOOD: Lipase: 67 U/L — ABNORMAL HIGH (ref 11–51)

## 2016-01-24 MED ORDER — ONDANSETRON HCL 4 MG/2ML IJ SOLN
4.0000 mg | Freq: Once | INTRAMUSCULAR | Status: AC
  Administered 2016-01-24: 4 mg via INTRAVENOUS
  Filled 2016-01-24: qty 2

## 2016-01-24 MED ORDER — FENTANYL CITRATE (PF) 100 MCG/2ML IJ SOLN
50.0000 ug | Freq: Once | INTRAMUSCULAR | Status: AC
  Administered 2016-01-24: 50 ug via INTRAVENOUS
  Filled 2016-01-24: qty 2

## 2016-01-24 MED ORDER — CETIRIZINE HCL 10 MG PO TABS: 10.0000 mg | ORAL_TABLET | Freq: Every day | ORAL | 0 refills | Status: DC

## 2016-01-24 MED ORDER — KETOROLAC TROMETHAMINE 30 MG/ML IJ SOLN
30.0000 mg | Freq: Once | INTRAMUSCULAR | Status: AC
  Administered 2016-01-24: 30 mg via INTRAVENOUS
  Filled 2016-01-24: qty 1

## 2016-01-24 MED ORDER — PROMETHAZINE-DM 6.25-15 MG/5ML PO SYRP: 5.0000 mL | ORAL_SOLUTION | Freq: Four times a day (QID) | ORAL | 0 refills | Status: DC | PRN

## 2016-01-24 MED ORDER — SODIUM CHLORIDE 0.9 % IV BOLUS (SEPSIS)
1000.0000 mL | Freq: Once | INTRAVENOUS | Status: AC
  Administered 2016-01-24: 1000 mL via INTRAVENOUS

## 2016-01-24 MED ORDER — ONDANSETRON HCL 4 MG PO TABS: 4.0000 mg | ORAL_TABLET | Freq: Four times a day (QID) | ORAL | 0 refills | Status: DC

## 2016-01-24 MED ORDER — NAPROXEN 500 MG PO TABS: 500.0000 mg | ORAL_TABLET | Freq: Two times a day (BID) | ORAL | 0 refills | Status: DC

## 2016-01-24 NOTE — ED Triage Notes (Signed)
Pt reports right ear and left ear pain that started week ago. Pt reports starting to feel weak about four days ago with pressure in her right eye. Pt reports trouble swallowing with SOB and Chest pain.

## 2016-01-24 NOTE — ED Provider Notes (Signed)
[facility]-EMERGENCY DEPT Provider Note   CSN: [id number] Arrival date & time: 01/24/16  1325  History   Chief Complaint Chief Complaint  Patient presents with  . Otalgia   HPI   FRANCYS BOLIN is an 25 y.o. female who presents to the ED for evaluation of multiple complaints. She states overall she feels "terrible." She states she has had bilateral ear fullness for the past week. Denies sharp ear pain. States a few days ago she started getting nasal and sinus congestion and runny nose. She also endorses a cough productive of green sputum. States she has severe diffuse body aches. STates she has sore throat. She reports mild diffuse abdominal pain with some nausea but no vomiting or diarrhea. Denies urinary symptoms. She is unsure if she has had a fever but has had chills. 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   Past Medical History:  Diagnosis Date  . Headache(784.0)   . Obesity     There are no active problems to display for this patient.   Past Surgical History:  Procedure Laterality Date  . CESAREAN SECTION    . ORIF ANKLE FRACTURE Right 12/22/2012   Procedure: OPEN REDUCTION INTERNAL FIXATION (ORIF) ANKLE FRACTURE;  Surgeon: Mable Paris, MD;  Location: [facility];  Service: Orthopedics;  Laterality: Right;  . sweat gland      OB History    No data available       Home Medications    Prior to Admission medications   Medication Sig Start Date End Date Taking? Authorizing Provider  etonogestrel (IMPLANON) 68 MG IMPL implant Inject 1 each into the skin once. Implanted April 2014    Historical Provider, MD  hydrocortisone cream 1 % Apply to affected area 2 times daily 02/07/15   Eyvonne Mechanic, PA-C  ibuprofen (ADVIL,MOTRIN) 600 MG tablet Take 1 tablet (600 mg total) by mouth every 6 (six) hours as needed. 07/16/15   Hanna Patel-Mills, PA-C  loratadine (CLARITIN) 10 MG tablet Take 1 tablet (10 mg total) by mouth  daily. 02/07/15   Eyvonne Mechanic, PA-C  naproxen (NAPROSYN) 500 MG tablet Take 1 tablet (500 mg total) by mouth 2 (two) times daily. 02/20/15   Tatyana Kirichenko, PA-C  triamcinolone cream (KENALOG) 0.1 % Apply 1 application topically 2 (two) times daily. Patient not taking: Reported on 09/13/2014 08/24/14   Tilden Fossa, MD    Family History No family history on file.  Social History Social History  Substance Use Topics  . Smoking status: Current Every Day Smoker    Packs/day: 0.50  . Smokeless tobacco: Never Used  . Alcohol use Yes     Comment: occ     Allergies   Latex   Review of Systems Review of Systems 10 Systems reviewed and are negative for acute change except as noted in the HPI.  Physical Exam Updated Vital Signs BP 117/81   Pulse 74   Temp 98.8 F (37.1 C) (Oral)   Resp 20   Ht 5\' 8"  (1.727 m)   Wt 117.9 kg   LMP 01/17/2016 (Approximate)   SpO2 100%   BMI 39.53 kg/m   Physical Exam  Constitutional: She is oriented to person, place, and time.  Appears uncomfortable  HENT:  Right Ear: External ear normal.  Left Ear: External ear normal.  Nose: Nose normal.  Mouth/Throat: Oropharynx is clear and moist. No oropharyngeal exudate.  Bilateral effusion without erythema or bulging/retraction  of TM. No tragal tenderness or mastoid tenderness bilaterally +bilateral nasal congestion and rhinorrhea No posterior oropharyngeal erythema or edema  Eyes: Conjunctivae and EOM are normal. Pupils are equal, round, and reactive to light.  Neck: Normal range of motion. Neck supple.  No meningismus  Cardiovascular: Normal rate, regular rhythm, normal heart sounds and intact distal pulses.   Pulmonary/Chest: Effort normal and breath sounds normal. No respiratory distress. She has no wheezes.  Abdominal: Soft. Bowel sounds are normal. She exhibits no distension. There is no rebound and no guarding.  Mild diffuse abdominal tenderness without rebound or guarding No cva  tenderness  Musculoskeletal: She exhibits no edema.  No midline back tenderness  Lymphadenopathy:    She has no cervical adenopathy.  Neurological: She is alert and oriented to person, place, and time. No cranial nerve deficit.  Skin: Skin is warm and dry.  Psychiatric: She has a normal mood and affect.  Nursing note and vitals reviewed.    ED Treatments / Results  Labs (all labs ordered are listed, but only abnormal results are displayed) Labs Reviewed  COMPREHENSIVE METABOLIC PANEL - Abnormal; Notable for the following:       Result Value   ALT 13 (*)    All other components within normal limits  LIPASE, BLOOD - Abnormal; Notable for the following:    Lipase 67 (*)    All other components within normal limits  CBC WITH DIFFERENTIAL/PLATELET - Abnormal; Notable for the following:    Monocytes Absolute 1.3 (*)    All other components within normal limits  I-STAT BETA HCG BLOOD, ED ([facility], [facility], [facility] ONLY)    EKG  EKG Interpretation None       Radiology Dg Chest 2 View  Result Date: 01/24/2016 CLINICAL DATA:  Acute cough. EXAM: CHEST  2 VIEW COMPARISON:  09/13/2014 FINDINGS: Mild cardiomegaly noted. There is no evidence of focal airspace disease, pulmonary edema, suspicious pulmonary nodule/mass, pleural effusion, or pneumothorax. No acute bony abnormalities are identified. IMPRESSION: Mild cardiomegaly without evidence of active cardiopulmonary disease. Electronically Signed   By: Harmon PierJeffrey  Hu M.D.   On: 01/24/2016 19:15    Procedures Procedures (including critical care time)  Medications Ordered in ED Medications  sodium chloride 0.9 % bolus 1,000 mL (1,000 mLs Intravenous New Bag/Given 01/24/16 1832)  ondansetron (ZOFRAN) injection 4 mg (4 mg Intravenous Given 01/24/16 1832)  ketorolac (TORADOL) 30 MG/ML injection 30 mg (30 mg Intravenous Given 01/24/16 1832)  fentaNYL (SUBLIMAZE) injection 50 mcg (50 mcg Intravenous Given 01/24/16 2009)     Initial Impression / Assessment  and Plan / ED Course  I have reviewed the triage vital signs and the nursing notes.  Pertinent labs & imaging results that were available during my care of the patient were reviewed by me and considered in my medical decision making (see chart for details).  Clinical Course    Likely viral URI. Labs unrevealing. CXR negative. Pt hemodynamically stable. Will give rx for supportive meds. ER return precautions given.  Final Clinical Impressions(s) / ED Diagnoses   Final diagnoses:  Viral syndrome  URI (upper respiratory infection)    New Prescriptions New Prescriptions   CETIRIZINE (ZYRTEC) 10 MG TABLET    Take 1 tablet (10 mg total) by mouth daily.   NAPROXEN (NAPROSYN) 500 MG TABLET    Take 1 tablet (500 mg total) by mouth 2 (two) times daily.   ONDANSETRON (ZOFRAN) 4 MG TABLET    Take 1 tablet (4 mg total) by mouth  every 6 (six) hours.   PROMETHAZINE-DEXTROMETHORPHAN (PROMETHAZINE-DM) 6.25-15 MG/5ML SYRUP    Take 5 mLs by mouth 4 (four) times daily as needed for cough.     Carlene Coria, PA-C 01/24/16 2031    Arby Barrette, MD 01/28/16 Mikle Bosworth

## 2016-01-24 NOTE — Discharge Instructions (Signed)
Your labs were normal. Chest x-ray was negative. You likely have a virus. Take medications as prescribed as needed. Return to the ER for new or worsening symptoms.

## 2016-01-24 NOTE — ED Notes (Signed)
Pt gone to car for a few mins will return

## 2016-01-24 NOTE — ED Notes (Signed)
Pt held for over 30 minutes after fentanyl injection and pt tolerating well. Pt pain free and d/c home with friend driving. Pt verbalized understanding of d/c instructions and has not further questions. Pt stable and NAD. VSS

## 2016-01-27 ENCOUNTER — Encounter (HOSPITAL_BASED_OUTPATIENT_CLINIC_OR_DEPARTMENT_OTHER): Payer: Self-pay | Admitting: Emergency Medicine

## 2016-03-13 ENCOUNTER — Other Ambulatory Visit: Payer: Self-pay

## 2016-04-09 ENCOUNTER — Ambulatory Visit (HOSPITAL_COMMUNITY)
Admission: RE | Admit: 2016-04-09 | Discharge: 2016-04-09 | Disposition: A | Discharge status: Home / Self Care | Payer: Self-pay | Source: Ambulatory Visit | Attending: Obstetrics and Gynecology | Admitting: Obstetrics and Gynecology

## 2016-04-09 ENCOUNTER — Encounter (HOSPITAL_COMMUNITY): Payer: Self-pay

## 2016-04-09 VITALS — BP 120/84 | Temp 97.8°F | Ht 68.0 in | Wt 262.8 lb

## 2016-04-09 DIAGNOSIS — R87613 High grade squamous intraepithelial lesion on cytologic smear of cervix (HGSIL): Secondary | ICD-10-CM

## 2016-04-09 DIAGNOSIS — Z1239 Encounter for other screening for malignant neoplasm of breast: Secondary | ICD-10-CM

## 2016-04-09 NOTE — Patient Instructions (Addendum)
Explained breast self awareness with Susan English. Patient did not need a Pap smear today due to last Pap smear was 08/28/2015. Explained the colposcopy to patient the recommended follow up for her abnormal Pap smear. [facility] for a colposcopy to follow up for abnormal Pap smear. Appointment scheduled for Tuesday, April 14, 2016 at 1320. [facility]. Appointment scheduled for Friday, April 10, 2016 at 1510. Patient aware of appointments and will be there.Discussed smoking cessation with patient. Referred patient to the [facility] and gave resources to free smoking cessation classes at [facility]. Susan English verbalized understanding.  Letetia Romanello, Kathaleen Maserhristine Poll, RN 4:33 PM

## 2016-04-09 NOTE — Progress Notes (Signed)
[facility] due to having an abnormal Pap smear that a colposcopy is recommended for follow up. Patient complained of bilateral breasts lumps and left breast breast pain that comes and goes x 1 year. Patient rated pain at a 10 out of 10.  Pap Smear:  Pap smear not completed today. [facility]. [facility] to the [facility] for a colposcopy to follow up for abnormal Pap smear. Appointment scheduled for Tuesday, April 14, 2016 at 1320. Patient stated all of her Pap smears have been abnormal. 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 Patient stated she has only had repeat Pap smears to follow up for abnormal results. Last two Pap smear results are in EPIC.  Physical exam: Breasts Breasts symmetrical. Multiple scars observed on lower bilateral breasts and right axilla. Patient stated the scar on the right axilla was from a previous surgery. No nipple retraction bilateral breasts. No nipple discharge bilateral breasts. No lymphadenopathy. No lumps palpated left breast. Palpated a lump within the right axilla at 10 o'clock 21 cm from the nipple. No complaints of pain or tenderness on exam. [facility] for bilateral breast ultras[facility]. Appointment scheduled for Friday, April 10, 2016 at 1510.        Pelvic/Bimanual No Pap smear completed today since last Pap smear was 11/27/2015. Pap smear not indicated per BCCCP guidelines.   Smoking History: Patient is a current smoker. Discussed smoking cessation with patient. Referred patient to the [facility] and gave resources to free smoking cessation classes at [facility].  Patient Navigation: Patient education provided. Access to services provided for patient through [facility] program.

## 2016-04-10 ENCOUNTER — Ambulatory Visit
Admission: RE | Admit: 2016-04-10 | Discharge: 2016-04-10 | Disposition: A | Discharge status: Home / Self Care | Payer: No Typology Code available for payment source | Source: Ambulatory Visit | Attending: Obstetrics and Gynecology | Admitting: Obstetrics and Gynecology

## 2016-04-10 ENCOUNTER — Encounter (HOSPITAL_COMMUNITY): Payer: Self-pay | Admitting: *Deleted

## 2016-04-10 DIAGNOSIS — N6320 Unspecified lump in the left breast, unspecified quadrant: Principal | ICD-10-CM

## 2016-04-10 DIAGNOSIS — N6310 Unspecified lump in the right breast, unspecified quadrant: Secondary | ICD-10-CM

## 2016-04-14 ENCOUNTER — Encounter: Payer: Self-pay | Admitting: Obstetrics and Gynecology

## 2016-05-13 ENCOUNTER — Encounter: Payer: Self-pay | Admitting: Medical

## 2016-05-13 ENCOUNTER — Ambulatory Visit (INDEPENDENT_AMBULATORY_CARE_PROVIDER_SITE_OTHER): Payer: Self-pay | Admitting: Medical

## 2016-05-13 ENCOUNTER — Other Ambulatory Visit (HOSPITAL_COMMUNITY)
Admission: RE | Admit: 2016-05-13 | Discharge: 2016-05-13 | Disposition: A | Discharge status: Home / Self Care | Payer: Medicaid Other | Source: Ambulatory Visit | Attending: Medical | Admitting: Medical

## 2016-05-13 DIAGNOSIS — N871 Moderate cervical dysplasia: Secondary | ICD-10-CM | POA: Insufficient documentation

## 2016-05-13 DIAGNOSIS — R87613 High grade squamous intraepithelial lesion on cytologic smear of cervix (HGSIL): Secondary | ICD-10-CM | POA: Insufficient documentation

## 2016-05-13 LAB — POCT PREGNANCY, URINE: Preg Test, Ur: NEGATIVE

## 2016-05-13 NOTE — Patient Instructions (Signed)
Colposcopy, Care After This sheet gives you information about how to care for yourself after your procedure. Your doctor may also give you more specific instructions. If you have problems or questions, contact your doctor. What can I expect after the procedure? If you did not have a tissue sample removed (did not have a biopsy), you may only have some spotting for a few days. You can go back to your normal activities. If you had a tissue sample removed, it is common to have:  Soreness and pain. This may last for a few days.  Light-headedness.  Mild bleeding from your vagina or dark-colored, grainy discharge from your vagina. This may last for a few days. You may need to wear a sanitary pad.  Spotting for at least 48 hours after the procedure. Follow these instructions at home:  Take over-the-counter and prescription medicines only as told by your doctor. Ask your doctor what medicines you can start taking again. This is very important if you take blood-thinning medicine.  Do not drive or use heavy machinery while taking prescription pain medicine.  For 3 days, or as long as your doctor tells you, avoid:  Douching.  Using tampons.  Having sex.  If you use birth control (contraception), keep using it.  Limit activity for the first day after the procedure. Ask your doctor what activities are safe for you.  It is up to you to get the results of your procedure. Ask your doctor when your results will be ready.  Keep all follow-up visits as told by your doctor. This is important. Contact a doctor if:  You get a skin rash. Get help right away if:  You are bleeding a lot from your vagina. It is a lot of bleeding if you are using more than one pad an hour for 2 hours in a row.  You have clumps of blood (blood clots) coming from your vagina.  You have a fever.  You have chills  You have pain in your lower belly (pelvic area).  You have signs of infection, such as vaginal  discharge that is:  Different than usual.  Yellow.  Bad-smelling.  You have very pain or cramps in your lower belly that do not get better with medicine.  You feel light-headed.  You feel dizzy.  You pass out (faint). Summary  If you did not have a tissue sample removed (did not have a biopsy), you may only have some spotting for a few days. You can go back to your normal activities.  If you had a tissue sample removed, it is common to have mild pain and spotting for 48 hours.  For 3 days, or as long as your doctor tells you, avoid douching, using tampons and having sex.  Get help right away if you have bleeding, very bad pain, or signs of infection. This information is not intended to replace advice given to you by your health care provider. Make sure you discuss any questions you have with your health care provider. Document Released: 10/14/2007 Document Revised: 01/15/2016 Document Reviewed: 01/15/2016 Elsevier Interactive Patient Education  2017 [employer]  

## 2016-05-13 NOTE — Progress Notes (Signed)
    GYNECOLOGY CLINIC COLPOSCOPY PROCEDURE NOTE  Ms. Susan English is a 26 y.o. 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 Discussed role for HPV in cervical dysplasia, need for surveillance.  Patient given informed consent, signed copy in the chart, time out was performed.  Placed in lithotomy position. Cervix viewed with speculum and colposcope after application of acetic acid.   Colposcopy adequate? Yes  acetowhite lesion(s) noted at 12 o'clock and 8 o'clock and abnormal vessels noted at 8 o'clock; biopsies obtained.  ECC specimen obtained. All specimens were labelled and sent to pathology.   Patient was given post procedure instructions.  Will follow up pathology and manage accordingly.  Routine preventative health maintenance measures emphasized.   Marny LowensteinJulie N Wenzel, PA-C 05/13/2016 3:48 PM

## 2016-05-18 ENCOUNTER — Telehealth: Payer: Self-pay | Admitting: *Deleted

## 2016-05-18 NOTE — Telephone Encounter (Signed)
Per message from Vonzella NippleJulie Wenzel, PA patient needs LEEP consult/ LEEP  Scheduled. Per discussion with Dr. Debroah LoopArnold may schedule on same day, have patient come in early to view LEEP video.   I called Susan English and explained to her results of her colposcopy indicate and reccommendation  for LEEP . Explained front office will call her with appointment and we want her to come in alittle early so she can watch the LEEP video and then will see doctor and answer questions and then have LEEP. She voices understanding.

## 2016-06-04 ENCOUNTER — Encounter: Payer: Self-pay | Admitting: Obstetrics & Gynecology

## 2016-06-04 ENCOUNTER — Telehealth: Payer: Self-pay | Admitting: General Practice

## 2016-06-04 NOTE — Telephone Encounter (Signed)
Patient no showed for appt today. Called patient and she states she was not aware of the appt & no one told her about that. Apologized to patient and explained that someone from the front office will call her to reschedule. Patient verbalized understanding & had no questions

## 2016-08-13 ENCOUNTER — Inpatient Hospital Stay (HOSPITAL_COMMUNITY)
Admission: AD | Admit: 2016-08-13 | Discharge: 2016-08-13 | Disposition: A | Discharge status: Home / Self Care | Payer: Medicaid Other | Source: Ambulatory Visit | Attending: Obstetrics & Gynecology | Admitting: Obstetrics & Gynecology

## 2016-08-13 ENCOUNTER — Encounter (HOSPITAL_COMMUNITY): Payer: Self-pay | Admitting: *Deleted

## 2016-08-13 DIAGNOSIS — F17210 Nicotine dependence, cigarettes, uncomplicated: Secondary | ICD-10-CM | POA: Insufficient documentation

## 2016-08-13 DIAGNOSIS — Z79899 Other long term (current) drug therapy: Secondary | ICD-10-CM | POA: Insufficient documentation

## 2016-08-13 DIAGNOSIS — K529 Noninfective gastroenteritis and colitis, unspecified: Secondary | ICD-10-CM | POA: Insufficient documentation

## 2016-08-13 LAB — CBC WITH DIFFERENTIAL/PLATELET
BASOS ABS: 0 10*3/uL (ref 0.0–0.1)
Basophils Relative: 1 %
EOS ABS: 0.1 10*3/uL (ref 0.0–0.7)
EOS PCT: 1 %
HCT: 37.9 % (ref 36.0–46.0)
Hemoglobin: 12.4 g/dL (ref 12.0–15.0)
Lymphocytes Relative: 29 %
Lymphs Abs: 2.5 10*3/uL (ref 0.7–4.0)
MCH: 28.1 pg (ref 26.0–34.0)
MCHC: 32.7 g/dL (ref 30.0–36.0)
MCV: 85.9 fL (ref 78.0–100.0)
Monocytes Absolute: 0.9 10*3/uL (ref 0.1–1.0)
Monocytes Relative: 11 %
Neutro Abs: 4.9 10*3/uL (ref 1.7–7.7)
Neutrophils Relative %: 58 %
PLATELETS: 283 10*3/uL (ref 150–400)
RBC: 4.41 MIL/uL (ref 3.87–5.11)
RDW: 14.4 % (ref 11.5–15.5)
WBC: 8.4 10*3/uL (ref 4.0–10.5)

## 2016-08-13 LAB — COMPREHENSIVE METABOLIC PANEL
ALBUMIN: 3.6 g/dL (ref 3.5–5.0)
ALT: 12 U/L — ABNORMAL LOW (ref 14–54)
ANION GAP: 6 (ref 5–15)
AST: 15 U/L (ref 15–41)
Alkaline Phosphatase: 74 U/L (ref 38–126)
BUN: 14 mg/dL (ref 6–20)
CHLORIDE: 107 mmol/L (ref 101–111)
CO2: 25 mmol/L (ref 22–32)
Calcium: 8.9 mg/dL (ref 8.9–10.3)
Creatinine, Ser: 0.67 mg/dL (ref 0.44–1.00)
GFR calc Af Amer: >60 mL/min (ref >60)
GFR calc non Af Amer: >60 mL/min (ref >60)
GLUCOSE: 108 mg/dL — AB (ref 65–99)
POTASSIUM: 3.8 mmol/L (ref 3.5–5.1)
Sodium: 138 mmol/L (ref 135–145)
Total Bilirubin: 0.8 mg/dL (ref 0.3–1.2)
Total Protein: 7.1 g/dL (ref 6.5–8.1)

## 2016-08-13 LAB — URINALYSIS, ROUTINE W REFLEX MICROSCOPIC
Bilirubin Urine: NEGATIVE
GLUCOSE, UA: NEGATIVE mg/dL
Hgb urine dipstick: NEGATIVE
Ketones, ur: 5 mg/dL — AB
LEUKOCYTES UA: NEGATIVE
Nitrite: NEGATIVE
PROTEIN: NEGATIVE mg/dL
SPECIFIC GRAVITY, URINE: 1.031 — AB (ref 1.005–1.030)
pH: 5 (ref 5.0–8.0)

## 2016-08-13 LAB — POCT PREGNANCY, URINE: PREG TEST UR: NEGATIVE

## 2016-08-13 LAB — LIPASE, BLOOD: LIPASE: 28 U/L (ref 11–51)

## 2016-08-13 MED ORDER — ONDANSETRON HCL 4 MG PO TABS: 4.0000 mg | ORAL_TABLET | Freq: Three times a day (TID) | ORAL | 0 refills | Status: DC | PRN

## 2016-08-13 NOTE — Discharge Instructions (Signed)
Chronic Diarrhea Diarrhea is a condition in which a person passes frequent loose and watery stools. It can cause you to feel weak and dehydrated. Dehydration can make you tired and thirsty. It can also cause a dry mouth, decreased urination, and dark yellow urine. Diarrhea is a sign of another underlying problem, such as:  Infection.  Medication side effects.  Dietary intolerance, such as lactose intolerance.  Conditions such as celiac disease, irritable bowel syndrome (IBS), or inflammatory bowel disease (IBD). In most cases, diarrhea lasts 2-3 days. Diarrhea that lasts longer than 4 weeks is called long-lasting (chronic) diarrhea. It is important that you treat your diarrhea as told by your health care provider. Follow these instructions at home: Follow these recommendations as told by your health care provider. Eating and drinking   Take an oral rehydration solution (ORS). This is a drink that is designed to keep you hydrated. It can be found at pharmacies and retail stores.  Drink clear fluids, such as water, ice chips, diluted fruit juice, and low-calorie sports drinks.  Follow the diet recommended by your health care provider. You may need to avoid foods that trigger diarrhea for you.  Avoid foods and beverages that contain a lot of sugar or caffeine.  Avoid alcohol.  Avoid spicy or fatty foods. General instructions   Drink enough fluid to keep your urine clear or pale yellow.  Wash your hands often and after each diarrhea episode. If soap and water are not available, use hand sanitizer.  Make sure that all people in your household wash their hands well and often.  Take over-the-counter and prescription medicines only as told by your health care provider.  If you were prescribed an antibiotic medicine, take it as told by your health care provider. Do not stop taking the antibiotic even if you start to feel better.  Rest at home while you recover.  Watch your condition  for any changes.  Take a warm bath to relieve any burning or pain from frequent diarrhea episodes.  Keep all follow-up visits as told by your health care provider. This is important. Contact a health care provider if:  You have a fever.  Your diarrhea gets worse or does not get better.  You have new symptoms.  You cannot drink fluids without vomiting.  You feel light-headed or dizzy.  You have a headache.  You have muscle cramps.  You have severe pain in the rectum. Get help right away if:  You have persistent vomiting.  You have chest pain.  You feel extremely weak or you faint.  You have bloody or black stools, or stools that look like tar.  You have severe pain, cramping, or bloating in your abdomen, or pain that stays in one place.  You have trouble breathing or you are breathing very quickly.  Your heart is beating very quickly.  Your skin feels cold and clammy.  You feel confused.  You have a severe headache.  You have signs of dehydration, such as:  Dark urine, very little urine, or no urine.  Cracked lips.  Dry mouth.  Sunken eyes.  Sleepiness.  Weakness. Summary  Chronic diarrhea is a condition in which a person passes frequent loose and watery stools for more than 4 weeks.  Diarrhea is a sign of another underlying problem.  Drink enough fluid to keep your urine clear or pale yellow to avoid dehydration.  Wash your hands often and after each diarrhea episode. If soap and water are not available,  use hand sanitizer.  It is important that you treat your diarrhea as told by your health care provider. This information is not intended to replace advice given to you by your health care provider. Make sure you discuss any questions you have with your health care provider. Document Released: 07/18/2003 Document Revised: 03/16/2016 Document Reviewed: 03/16/2016 Elsevier Interactive Patient Education  2017 [employer].   Food Choices to Help  Relieve Diarrhea, Adult When you have diarrhea, the foods you eat and your eating habits are very important. Choosing the right foods and drinks can help:  Relieve diarrhea.  Replace lost fluids and nutrients.  Prevent dehydration. What general guidelines should I follow? Relieving diarrhea   Choose foods with less than 2 g or .07 oz. of fiber per serving.  Limit fats to less than 8 tsp (38 g or 1.34 oz.) a day.  Avoid the following:  Foods and beverages sweetened with high-fructose corn syrup, honey, or sugar alcohols such as xylitol, sorbitol, and mannitol.  Foods that contain a lot of fat or sugar.  Fried, greasy, or spicy foods.  High-fiber grains, breads, and cereals.  Raw fruits and vegetables.  Eat foods that are rich in probiotics. These foods include dairy products such as yogurt and fermented milk products. They help increase healthy bacteria in the stomach and intestines (gastrointestinal tract, or GI tract).  If you have lactose intolerance, avoid dairy products. These may make your diarrhea worse.  Take medicine to help stop diarrhea (antidiarrheal medicine) only as told by your health care provider. Replacing nutrients   Eat small meals or snacks every 3-4 hours.  Eat bland foods, such as white rice, toast, or baked potato, until your diarrhea starts to get better. Gradually reintroduce nutrient-rich foods as tolerated or as told by your health care provider. This includes:  Well-cooked protein foods.  Peeled, seeded, and soft-cooked fruits and vegetables.  Low-fat dairy products.  Take vitamin and mineral supplements as told by your health care provider. Preventing dehydration    Start by sipping water or a special solution to prevent dehydration (oral rehydration solution, ORS). Urine that is clear or pale yellow means that you are getting enough fluid.  Try to drink at least 8-10 cups of fluid each day to help replace lost fluids.  You may add  other liquids in addition to water, such as clear juice or decaffeinated sports drinks, as tolerated or as told by your health care provider.  Avoid drinks with caffeine, such as coffee, tea, or soft drinks.  Avoid alcohol. What foods are recommended? The items listed may not be a complete list. Talk with your health care provider about what dietary choices are best for you. Grains  White rice. White, [place], or pita breads (fresh or toasted), including plain rolls, buns, or bagels. White pasta. Saltine, soda, or graham crackers. Pretzels. Low-fiber cereal. Cooked cereals made with water (such as cornmeal, farina, or cream cereals). Plain muffins. Matzo. Melba toast. Zwieback. Vegetables  Potatoes (without the skin). Most well-cooked and canned vegetables without skins or seeds. Tender lettuce. Fruits  Apple sauce. Fruits canned in juice. Cooked apricots, cherries, grapefruit, peaches, pears, or plums. Fresh bananas and cantaloupe. Meats and other protein foods  Baked or boiled chicken. Eggs. Tofu. Fish. Seafood. Smooth nut butters. Ground or well-cooked tender beef, ham, veal, lamb, pork, or poultry. Dairy  Plain yogurt, kefir, and unsweetened liquid yogurt. Lactose-free milk, buttermilk, skim milk, or soy milk. Low-fat or nonfat hard cheese. Beverages  Water.  Low-calorie sports drinks. Fruit juices without pulp. Strained tomato and vegetable juices. Decaffeinated teas. Sugar-free beverages not sweetened with sugar alcohols. Oral rehydration solutions, if approved by your health care provider. Seasoning and other foods  Bouillon, broth, or soups made from recommended foods. What foods are not recommended? The items listed may not be a complete list. Talk with your health care provider about what dietary choices are best for you. Grains  Whole grain, whole wheat, bran, or rye breads, rolls, pastas, and crackers. Wild or brown rice. Whole grain or bran cereals. Barley. Oats and oatmeal. Corn  tortillas or taco shells. Granola. Popcorn. Vegetables  Raw vegetables. Fried vegetables. Cabbage, broccoli, Brussels sprouts, artichokes, baked beans, beet greens, corn, kale, legumes, peas, sweet potatoes, and yams. Potato skins. Cooked spinach and cabbage. Fruits  Dried fruit, including raisins and dates. Raw fruits. Stewed or dried prunes. Canned fruits with syrup. Meat and other protein foods  Fried or fatty meats. Deli meats. Chunky nut butters. Nuts and seeds. Beans and lentils. Tomasa Blase. Hot dogs. Sausage. Dairy  High-fat cheeses. Whole milk, chocolate milk, and beverages made with milk, such as milk shakes. Half-and-half. Cream. sour cream. Ice cream. Beverages  Caffeinated beverages (such as coffee, tea, soda, or energy drinks). Alcoholic beverages. Fruit juices with pulp. Prune juice. Soft drinks sweetened with high-fructose corn syrup or sugar alcohols. High-calorie sports drinks. Fats and oils  Butter. Cream sauces. Margarine. Salad oils. Plain salad dressings. Olives. Avocados. Mayonnaise. Sweets and desserts  Sweet rolls, doughnuts, and sweet breads. Sugar-free desserts sweetened with sugar alcohols such as xylitol and sorbitol. Seasoning and other foods  Honey. Hot sauce. Chili powder. Gravy. Cream-based or milk-based soups. Pancakes and waffles. Summary  When you have diarrhea, the foods you eat and your eating habits are very important.  Make sure you get at least 8-10 cups of fluid each day, or enough to keep your urine clear or pale yellow.  Eat bland foods and gradually reintroduce healthy, nutrient-rich foods as tolerated, or as told by your health care provider.  Avoid high-fiber, fried, greasy, or spicy foods. This information is not intended to replace advice given to you by your health care provider. Make sure you discuss any questions you have with your health care provider. Document Released: 07/18/2003 Document Revised: 04/24/2016 Document Reviewed:  04/24/2016 Elsevier Interactive Patient Education  2017 [employer] Inc[employer]   Viral Gastroenteritis, Adult Viral gastroenteritis is also known as the stomach flu. This condition is caused by various viruses. These viruses can be passed from person to person very easily (are very contagious). This condition may affect your stomach, small intestine, and large intestine. It can cause sudden watery diarrhea, fever, and vomiting. Diarrhea and vomiting can make you feel weak and cause you to become dehydrated. You may not be able to keep fluids down. Dehydration can make you tired and thirsty, cause you to have a dry mouth, and decrease how often you urinate. Older adults and people with other diseases or a weak immune system are at higher risk for dehydration. It is important to replace the fluids that you lose from diarrhea and vomiting. If you become severely dehydrated, you may need to get fluids through an IV tube. What are the causes? Gastroenteritis is caused by various viruses, including rotavirus and norovirus. Norovirus is the most common cause in adults. You can get sick by eating food, drinking water, or touching a surface contaminated with one of these viruses. You can also get sick from sharing utensils or  other personal items with an infected person. What increases the risk? This condition is more likely to develop in people:  Who have a weak defense system (immune system).  Who live with one or more children who are younger than 49 years old.  Who live in a nursing home.  Who go on cruise ships. What are the signs or symptoms? Symptoms of this condition start suddenly 1-2 days after exposure to a virus. Symptoms may last a few days or as long as a week. The most common symptoms are watery diarrhea and vomiting. Other symptoms include:  Fever.  Headache.  Fatigue.  Pain in the abdomen.  Chills.  Weakness.  Nausea.  Muscle aches.  Loss of appetite. How is this  diagnosed? This condition is diagnosed with a medical history and physical exam. You may also have a stool test to check for viruses or other infections. How is this treated? This condition typically goes away on its own. The focus of treatment is to restore lost fluids (rehydration). Your health care provider may recommend that you take an oral rehydration solution (ORS) to replace important salts and minerals (electrolytes) in your body. Severe cases of this condition may require giving fluids through an IV tube. Treatment may also include medicine to help with your symptoms. Follow these instructions at home: Follow instructions from your health care provider about how to care for yourself at home. Eating and drinking  Follow these recommendations as told by your health care provider:  Take an ORS. This is a drink that is sold at pharmacies and retail stores.  Drink clear fluids in small amounts as you are able. Clear fluids include water, ice chips, diluted fruit juice, and low-calorie sports drinks.  Eat bland, easy-to-digest foods in small amounts as you are able. These foods include bananas, applesauce, rice, lean meats, toast, and crackers.  Avoid fluids that contain a lot of sugar or caffeine, such as energy drinks, sports drinks, and soda.  Avoid alcohol.  Avoid spicy or fatty foods. General instructions    Drink enough fluid to keep your urine clear or pale yellow.  Wash your hands often. If soap and water are not available, use hand sanitizer.  Make sure that all people in your household wash their hands well and often.  Take over-the-counter and prescription medicines only as told by your health care provider.  Rest at home while you recover.  Watch your condition for any changes.  Take a warm bath to relieve any burning or pain from frequent diarrhea episodes.  Keep all follow-up visits as told by your health care provider. This is important. Contact a health care  provider if:  You cannot keep fluids down.  Your symptoms get worse.  You have new symptoms.  You feel light-headed or dizzy.  You have muscle cramps. Get help right away if:  You have chest pain.  You feel extremely weak or you faint.  You see blood in your vomit.  Your vomit looks like coffee grounds.  You have bloody or black stools or stools that look like tar.  You have a severe headache, a stiff neck, or both.  You have a rash.  You have severe pain, cramping, or bloating in your abdomen.  You have trouble breathing or you are breathing very quickly.  Your heart is beating very quickly.  Your skin feels cold and clammy.  You feel confused.  You have pain when you urinate.  You have signs of dehydration,  such as:  Dark urine, very little urine, or no urine.  Cracked lips.  Dry mouth.  Sunken eyes.  Sleepiness.  Weakness. This information is not intended to replace advice given to you by your health care provider. Make sure you discuss any questions you have with your health care provider. Document Released: 04/27/2005 Document Revised: 10/09/2015 Document Reviewed: 01/01/2015 Elsevier Interactive Patient Education  2017 [employer].

## 2016-08-13 NOTE — MAU Note (Signed)
Pt C/O vomiting & diarrhea for the last week, having lower abd cramping.  Stools look "seedy, not watery."  Feels very tired.  Has had 10 stools in the last 24 hours.  Had a period last week that was shorter than usual, also lighter bleeding but had clots.

## 2016-08-13 NOTE — MAU Provider Note (Signed)
Chief Complaint: Emesis; Diarrhea; and Abdominal Pain   SUBJECTIVE HPI: Susan English is a 26 y.o. G1P0 at Unknown who presents [facility] reporting diarrhea/abdominal pain.   Diarrhea - Started 3-4 days ago, going about 8-10 times/day, consistency is mucousy, watery, orange appearing. Has not stopped. +nausea, vomited once on Monday (3 days ago). Able to eat, decreased appetite. No F/C. +abdominal pain, lower abdomen, comes/goes, improves after BM. States never had this before. No sick contacts. No family history of UC/Crohns/celiac. No melena or hematochezia.   08/06/16 LMP, 5 days lasted (normally 10), with some clots, but was slightly lighter than usual. VB ended on Tuesday. +Cramping. +sexually active, without condoms. Has nexplanon but "expired" Feb last year, has not gotten a new one yet. Took home UPT, two this week, both negative. [facility].    Past Medical History:  Diagnosis Date  . Headache(784.0)   . 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 Obesity    OB History  Gravida Para Term Preterm AB Living  1         1  SAB TAB Ectopic Multiple Live Births          1    # Outcome Date GA Lbr Len/2nd Weight Sex Delivery Anes PTL Lv  1 Gravida              Past Surgical History:  Procedure Laterality Date  . CESAREAN SECTION    . ORIF ANKLE FRACTURE Right 12/22/2012   Procedure: OPEN REDUCTION INTERNAL FIXATION (ORIF) ANKLE FRACTURE;  Surgeon: Mable Paris, MD;  Location: [facility];  Service: Orthopedics;  Laterality: Right;  . sweat gland     Social History   Social History  . Marital status: Single    Spouse name: N/A  . Number of children: N/A  . Years of education: N/A   Occupational History  . Not on file.   Social History Main Topics  . Smoking status: Current Every Day Smoker    Packs/day: 0.50  . Smokeless tobacco: Never Used  . Alcohol use  Yes     Comment: occ  . Drug use: No  . Sexual activity: Yes    Birth control/ protection: None     Comment: Nexplanon expired    Other Topics Concern  . Not on file   Social History Narrative  . No narrative on file   No current facility-administered medications on file prior to encounter.    Current Outpatient Prescriptions on File Prior to Encounter  Medication Sig Dispense Refill  . cetirizine (ZYRTEC) 10 MG tablet Take 1 tablet (10 mg total) by mouth daily. (Patient not taking: Reported on 05/13/2016) 14 tablet 0  . DM-Doxylamine-Acetaminophen (NYQUIL COLD & FLU PO) Take 30 mLs by mouth at bedtime as needed (cold).    Marland Kitchen etonogestrel (IMPLANON) 68 MG IMPL implant Inject 1 each into the skin once. Implanted April 2014    . hydrocortisone cream 1 % Apply to affected area 2 times daily (Patient not taking: Reported on 05/13/2016) 15 g 0  . ibuprofen (ADVIL,MOTRIN) 600 MG tablet Take 1 tablet (600 mg total) by mouth every 6 (six) hours as needed. (Patient not taking: Reported on 05/13/2016) 30 tablet 0  . loratadine (CLARITIN) 10 MG tablet Take 1 tablet (10 mg total) by mouth daily. (Patient not taking: Reported on 05/13/2016) 30 tablet 0  . naproxen (NAPROSYN) 500  MG tablet Take 1 tablet (500 mg total) by mouth 2 (two) times daily. (Patient not taking: Reported on 05/13/2016) 30 tablet 0  . naproxen (NAPROSYN) 500 MG tablet Take 1 tablet (500 mg total) by mouth 2 (two) times daily. (Patient not taking: Reported on 05/13/2016) 30 tablet 0  . ondansetron (ZOFRAN) 4 MG tablet Take 1 tablet (4 mg total) by mouth every 6 (six) hours. (Patient not taking: Reported on 05/13/2016) 12 tablet 0  . promethazine-dextromethorphan (PROMETHAZINE-DM) 6.25-15 MG/5ML syrup Take 5 mLs by mouth 4 (four) times daily as needed for cough. (Patient not taking: Reported on 05/13/2016) 118 mL 0  . triamcinolone cream (KENALOG) 0.1 % Apply 1 application topically 2 (two) times daily. (Patient not taking: Reported on 05/13/2016) 30 g  0  . [DISCONTINUED] albuterol (PROVENTIL HFA;VENTOLIN HFA) 108 (90 BASE) MCG/ACT inhaler Inhale 1-2 puffs into the lungs every 6 (six) hours as needed for wheezing. 1 Inhaler 0   Allergies  Allergen Reactions  . Latex Hives    I have reviewed the past Medical Hx, Surgical Hx, Social Hx, Allergies and Medications.   REVIEW OF SYSTEMS  A comprehensive ROS was negative except per HPI.   OBJECTIVE Patient Vitals for the past 24 hrs:  BP Temp Temp src Pulse Resp Height Weight  08/13/16 1318 111/70 98.3 F (36.8 C) Oral 88 18  (1.727 m) 266 lb (120.7 kg)    PHYSICAL EXAM Constitutional: Well-developed, well-nourished morbidly obese female in no acute distress.  Cardiovascular: normal rate, rhythm, no murmurs Respiratory: normal rate and effort. CTAB GI: Abd soft, minimally tender throughout, no guarding, non-distended. Pos BS x 4 MS: Extremities nontender, no edema, normal ROM Neurologic: Alert and oriented x 4.  GU: Neg CVAT.     MAU COURSE Stool culture CBC w/ diff - normal, no leukocytosis CMP - normal Lipase - normal UPT- neg UA - dehydrated Spec Grav 1.031, ketones 5, otherwise clean  MDM Plan of care reviewed with patient, including labs and tests ordered and medical treatment. Supportive care encouraged. If persists for 2 more weeks, needs to follow up with GI. No family history of UC/Crohns/Celiac. No sick contacts. Likely viral gastroenteritis.    ASSESSMENT 1. Noninfectious gastroenteritis, unspecified type     PLAN Discharge home in stable condition. Supportive care Will follow up on stool culture Follow up with GI in 2 weeks if no resolution   Allergies as of 08/13/2016      Reactions   Latex Hives      Medication List    STOP taking these medications   cetirizine 10 MG tablet Commonly known as:  ZYRTEC   hydrocortisone cream 1 %   naproxen 500 MG tablet Commonly known as:  NAPROSYN   promethazine-dextromethorphan 6.25-15 MG/5ML  syrup Commonly known as:  PROMETHAZINE-DM     TAKE these medications   IMPLANON 68 MG Impl implant Generic drug:  etonogestrel Inject 1 each into the skin once. Implanted April 2014.  Pt states that it is still implanted but expired   ondansetron 4 MG tablet Commonly known as:  ZOFRAN Take 1 tablet (4 mg total) by mouth every 8 (eight) hours as needed for nausea or vomiting. What changed:  when to take this  reasons to take this        Jen Mow, DO OB Fellow 08/13/2016 1:48 PM

## 2016-08-14 LAB — GASTROINTESTINAL PANEL BY PCR, STOOL (REPLACES STOOL CULTURE)

## 2016-08-18 ENCOUNTER — Encounter (HOSPITAL_COMMUNITY): Payer: Self-pay | Admitting: *Deleted

## 2016-08-18 NOTE — Progress Notes (Signed)
Letter mailed to patient about missed appointment

## 2016-10-03 IMAGING — CR DG CHEST 2V
2 series · 2 of 2 positions shown · non-contrast
Comparison: 05/21/2012

CLINICAL DATA: Shortness of breath.  Left breast pain for 1 week

EXAM:
CHEST  2 VIEW

[chest pa]
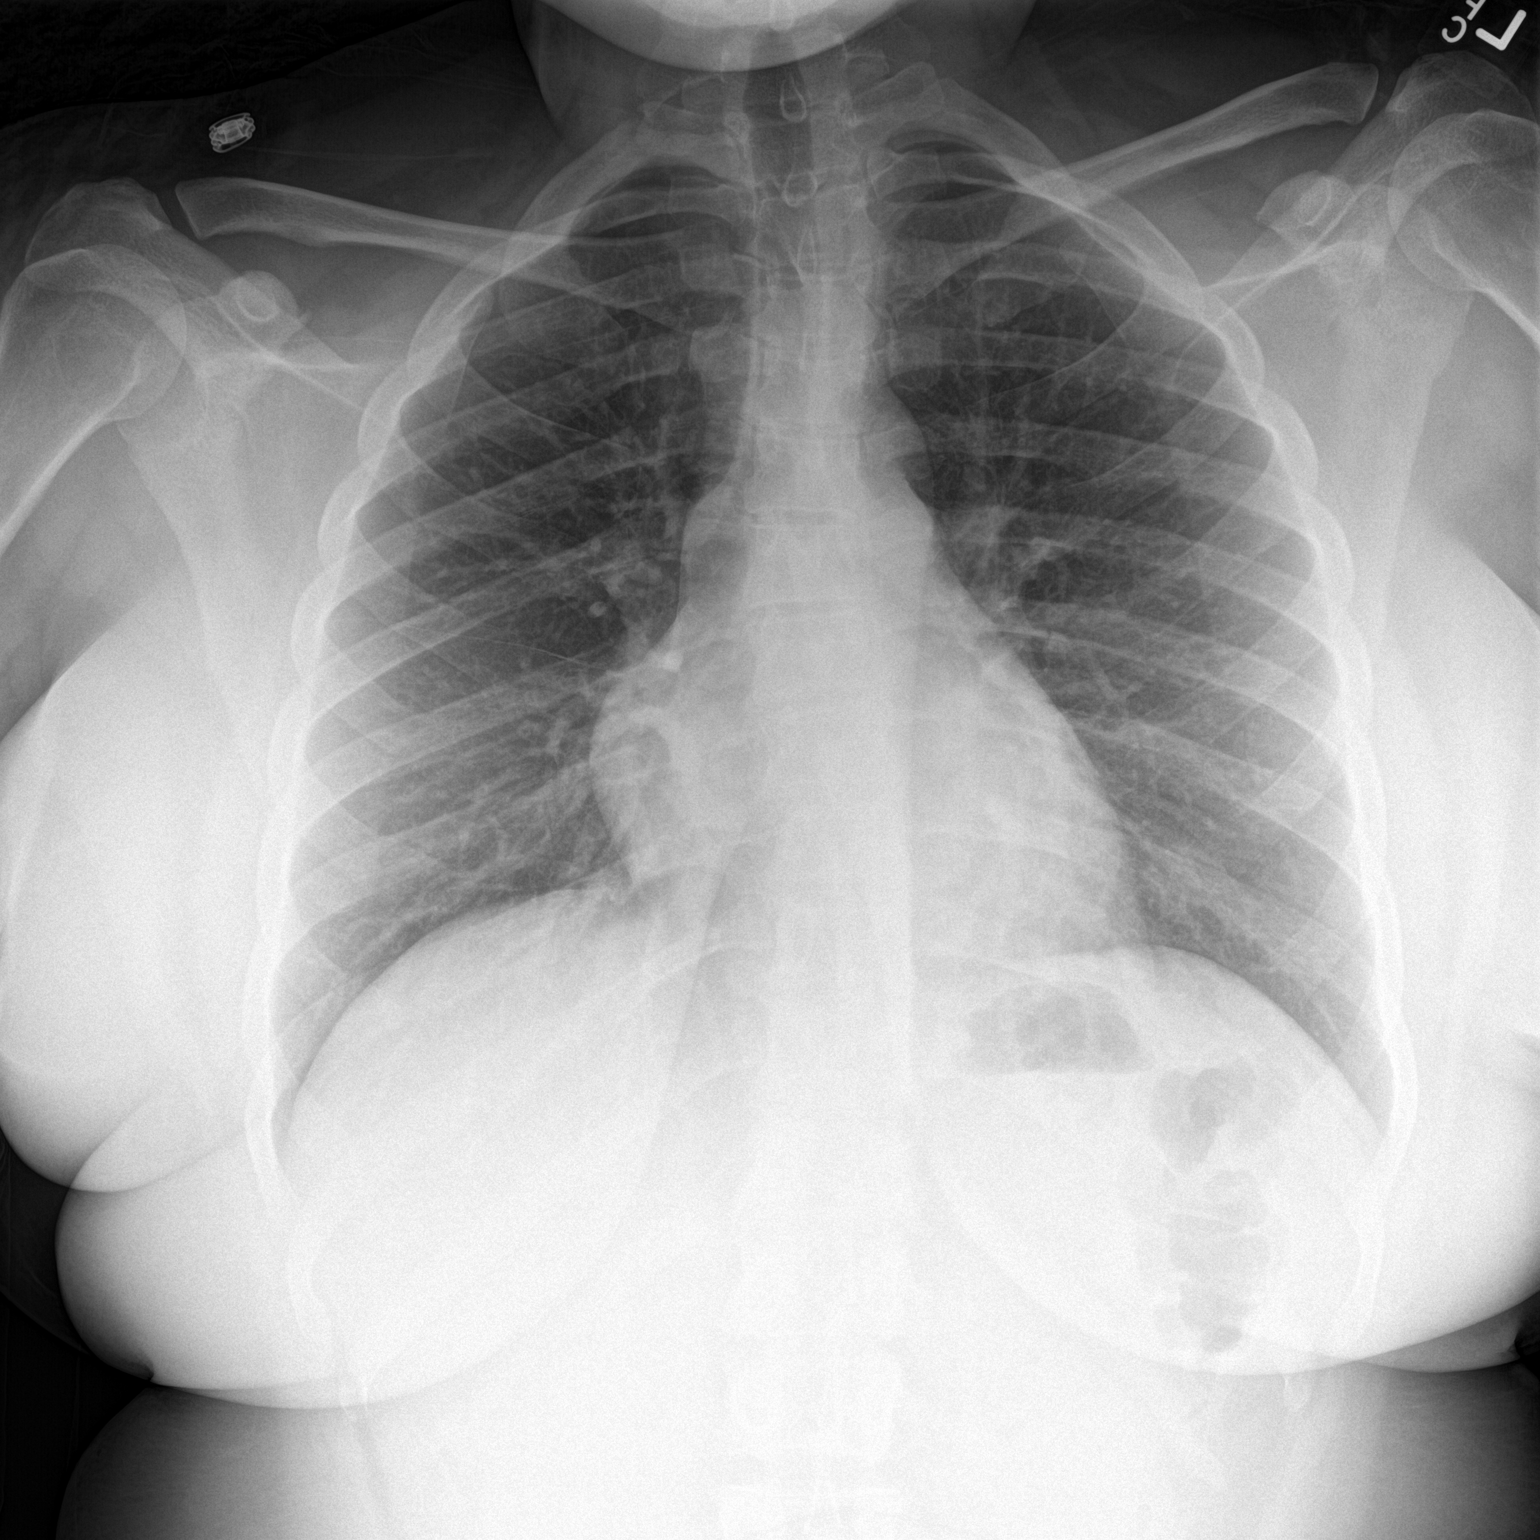

[chest lat]
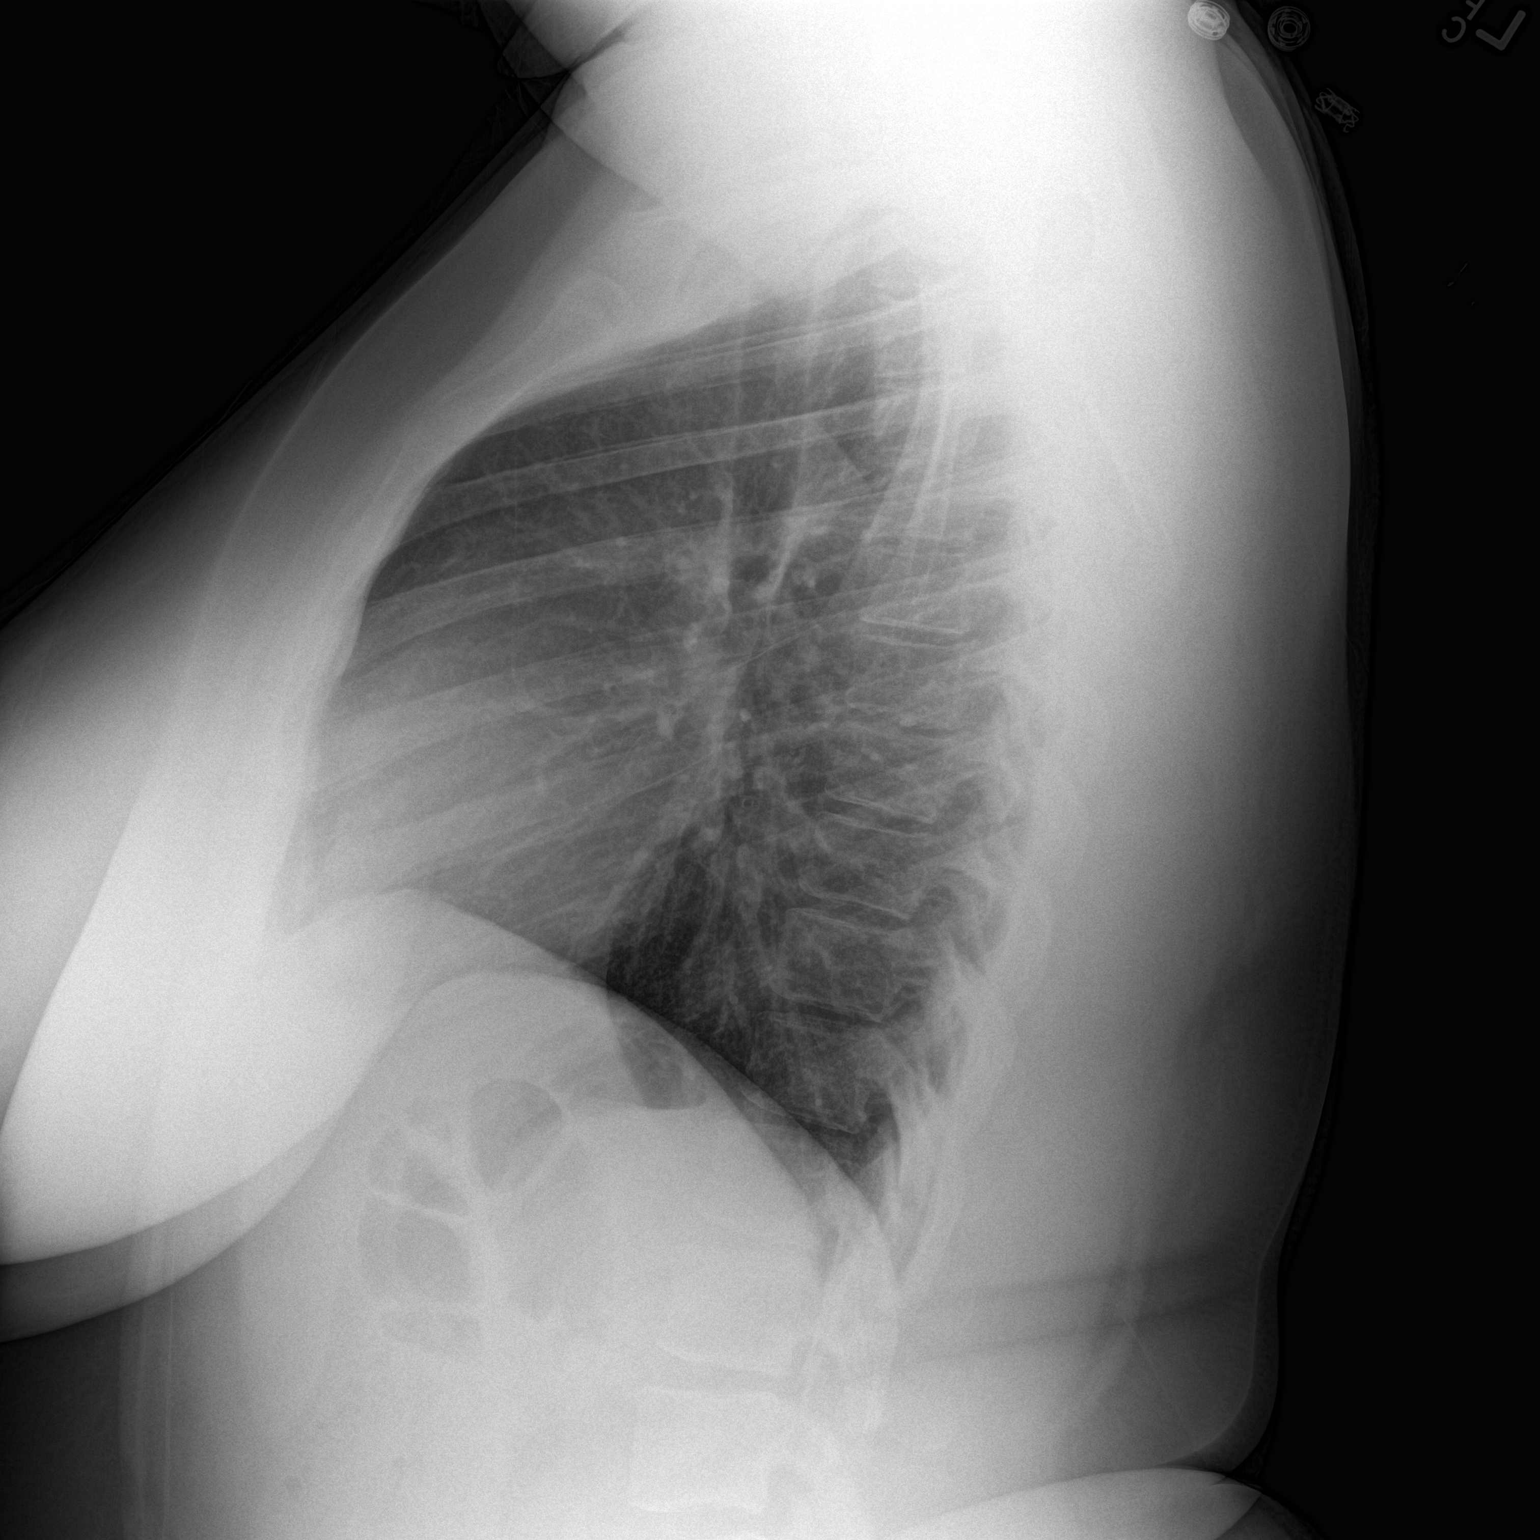

[2 of 2 positions shown; findings below may reference images not displayed]

FINDINGS: Normal heart size and mediastinal contours. No acute infiltrate or
edema. No effusion or pneumothorax. No acute osseous findings.
IMPRESSION: Negative chest.

## 2016-10-09 ENCOUNTER — Ambulatory Visit (INDEPENDENT_AMBULATORY_CARE_PROVIDER_SITE_OTHER): Payer: Self-pay | Admitting: Obstetrics & Gynecology

## 2016-10-09 ENCOUNTER — Encounter: Payer: Self-pay | Admitting: Obstetrics & Gynecology

## 2016-10-09 VITALS — BP 115/69 | HR 74 | Wt 258.1 lb

## 2016-10-09 DIAGNOSIS — Z3202 Encounter for pregnancy test, result negative: Secondary | ICD-10-CM

## 2016-10-09 DIAGNOSIS — N871 Moderate cervical dysplasia: Secondary | ICD-10-CM

## 2016-10-09 LAB — POCT PREGNANCY, URINE: Preg Test, Ur: NEGATIVE

## 2016-10-09 NOTE — Progress Notes (Signed)
   Subjective:    Patient ID: Deirdre Evenershley C Gabler, female    DOB: 12/15/1990, 26 y.o.   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   Review of Systems     Objective:   Physical Exam [id number] Breathing, conversing, and ambulating normally Consent was signed. All questions were answered. Time out procedure was done. Acetic acid was applied to the cervix. Appropriate cryo tip was chosen. Cryo was applied for 3 minutes. She tolerated the procedure well.      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 She desires more children in the future. Cryo done RTC for pap and cotesting in 6 months  Her Nexplanon has been expired for about a year. She would like it removed and then start on OCPs.

## 2016-11-04 ENCOUNTER — Ambulatory Visit (INDEPENDENT_AMBULATORY_CARE_PROVIDER_SITE_OTHER): Payer: Medicaid Other | Admitting: Obstetrics & Gynecology

## 2016-11-04 VITALS — BP 123/70 | HR 80 | Ht 68.0 in | Wt 255.9 lb

## 2016-11-04 DIAGNOSIS — Z308 Encounter for other contraceptive management: Secondary | ICD-10-CM

## 2016-11-04 DIAGNOSIS — Z3046 Encounter for surveillance of implantable subdermal contraceptive: Secondary | ICD-10-CM

## 2016-11-04 DIAGNOSIS — Z30011 Encounter for initial prescription of contraceptive pills: Secondary | ICD-10-CM

## 2016-11-04 MED ORDER — NORGESTIMATE-ETH ESTRADIOL 0.25-35 MG-MCG PO TABS: 1.0000 | ORAL_TABLET | Freq: Every day | ORAL | 11 refills | Status: DC

## 2016-11-04 MED ORDER — IBUPROFEN 800 MG PO TABS: 800.0000 mg | ORAL_TABLET | Freq: Three times a day (TID) | ORAL | 3 refills | Status: DC | PRN

## 2016-11-04 NOTE — Progress Notes (Signed)
Pt reports cramping since her cryo. She would like something for the pain.  Pt would like nexplanon removed and start on ocps, she plans a pregnancy in the near future.

## 2016-11-04 NOTE — Patient Instructions (Signed)
Return to clinic for any scheduled appointments or for any gynecologic concerns as needed.   

## 2016-11-04 NOTE — Progress Notes (Signed)
     GYNECOLOGY OFFICE PROCEDURE NOTE  Susan English C Jaquay is a 26 y.o. G1P0 here for Nexplanon removal.  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  No other gynecologic concerns.  Nexplanon Removal Patient identified, informed consent performed, consent signed.   Appropriate time out taken. Nexplanon site identified.  Area prepped in usual sterile fashon. One ml of 1% lidocaine was used to anesthetize the area at the distal end of the implant. A small stab incision was made right beside the implant on the distal portion.  The Nexplanon rod was grasped using hemostats and removed without difficulty.  There was minimal blood loss. There were no complications.  3 ml of 1% lidocaine was injected around the incision for post-procedure analgesia.   A pressure bandage was applied to reduce any bruising.  The patient tolerated the procedure well and was given post procedure instructions.  Patient is planning to use OCPs for contraception; Sprintec prescribed.  Ibuprofen also prescribed as needed for cramping.   Jaynie CollinsUGONNA  Anayah Arvanitis, MD, FACOG Attending Obstetrician & Gynecologist, [facility]

## 2016-11-10 ENCOUNTER — Ambulatory Visit: Payer: Self-pay | Admitting: Internal Medicine

## 2016-11-19 ENCOUNTER — Encounter: Payer: Self-pay | Admitting: *Deleted

## 2016-12-09 ENCOUNTER — Ambulatory Visit: Payer: Self-pay | Admitting: Internal Medicine

## 2017-01-07 ENCOUNTER — Emergency Department (HOSPITAL_COMMUNITY)
Admission: EM | Admit: 2017-01-07 | Discharge: 2017-01-07 | Discharge status: Against Medical Advice | Payer: Medicaid Other | Attending: Emergency Medicine | Admitting: Emergency Medicine

## 2017-01-07 ENCOUNTER — Encounter (HOSPITAL_COMMUNITY): Payer: Self-pay

## 2017-01-07 DIAGNOSIS — R05 Cough: Secondary | ICD-10-CM | POA: Insufficient documentation

## 2017-01-07 NOTE — ED Triage Notes (Signed)
Patient complains of 2 days of cough, congestion and sore throat with chills

## 2017-01-08 ENCOUNTER — Emergency Department (HOSPITAL_COMMUNITY)
Admission: EM | Admit: 2017-01-08 | Discharge: 2017-01-09 | Disposition: A | Discharge status: Home / Self Care | Payer: Self-pay | Attending: Emergency Medicine | Admitting: Emergency Medicine

## 2017-01-08 ENCOUNTER — Emergency Department (HOSPITAL_COMMUNITY): Payer: Self-pay

## 2017-01-08 ENCOUNTER — Encounter (HOSPITAL_COMMUNITY): Payer: Self-pay

## 2017-01-08 DIAGNOSIS — R519 Headache, unspecified: Secondary | ICD-10-CM

## 2017-01-08 DIAGNOSIS — R1030 Lower abdominal pain, unspecified: Secondary | ICD-10-CM

## 2017-01-08 DIAGNOSIS — Z91040 Latex allergy status: Secondary | ICD-10-CM | POA: Insufficient documentation

## 2017-01-08 DIAGNOSIS — R112 Nausea with vomiting, unspecified: Secondary | ICD-10-CM

## 2017-01-08 DIAGNOSIS — J069 Acute upper respiratory infection, unspecified: Secondary | ICD-10-CM

## 2017-01-08 DIAGNOSIS — R197 Diarrhea, unspecified: Secondary | ICD-10-CM | POA: Insufficient documentation

## 2017-01-08 DIAGNOSIS — F17210 Nicotine dependence, cigarettes, uncomplicated: Secondary | ICD-10-CM | POA: Insufficient documentation

## 2017-01-08 DIAGNOSIS — R51 Headache: Secondary | ICD-10-CM

## 2017-01-08 DIAGNOSIS — Z79899 Other long term (current) drug therapy: Secondary | ICD-10-CM | POA: Insufficient documentation

## 2017-01-08 LAB — URINALYSIS, ROUTINE W REFLEX MICROSCOPIC
BILIRUBIN URINE: NEGATIVE
Bacteria, UA: NONE SEEN
Glucose, UA: NEGATIVE mg/dL
HGB URINE DIPSTICK: NEGATIVE
Ketones, ur: 5 mg/dL — AB
NITRITE: NEGATIVE
Protein, ur: 30 mg/dL — AB
SPECIFIC GRAVITY, URINE: 1.034 — AB (ref 1.005–1.030)
pH: 5 (ref 5.0–8.0)

## 2017-01-08 MED ORDER — SODIUM CHLORIDE 0.9 % IV BOLUS (SEPSIS)
1000.0000 mL | Freq: Once | INTRAVENOUS | Status: AC
  Administered 2017-01-08: 1000 mL via INTRAVENOUS

## 2017-01-08 MED ORDER — ONDANSETRON 4 MG PO TBDP
4.0000 mg | ORAL_TABLET | Freq: Once | ORAL | Status: AC | PRN
  Administered 2017-01-08: 4 mg via ORAL
  Filled 2017-01-08: qty 1

## 2017-01-08 NOTE — ED Provider Notes (Signed)
[facility]-EMERGENCY DEPT Provider Note   CSN: [id number] Arrival date & time: 01/08/17  2007     History   Chief Complaint Chief Complaint  Patient presents with  . Nausea  . Headache  . Diarrhea    HPI Susan English is a 26 y.o. female who presents with 3 days of nasal congestion, sore throat, productive cough, nausea/vomiting, diarrhea. Patient also reports 2 days of abdominal soreness and headache. Patient reports that abdominal pain started after she vomited. She reported that for the first day she had multiple episodes of non-bloody, non-bilious emesis but that has since improved. Patient also reports that she has had intermittent episodes of diarrhea. No presence of blood or melena. She states that now she has been able to tolerate PO without any difficulty. Patient also reports intermittent cough that is productive with brown sputum. Patient states she has some chest soreness only with coughing and denies any chest pain. Patient reports that 2 days ago she began developing a gradually worsening right sided headache. She reports that headache was gradual in nature and denies any thunderclap headache. She denies any trauma, fall, or injury. She has not taken any medications for the pain. Patient reports that she feels lightheaded, "like she might pass out." Patient states that she has been able to swallow without difficulty and denies any drooling. Patient denies any vision changes, chest pain, difficulty breathing, dysuria, hematuria, vaginal bleeding, vaginal discharge, numbness/weakness of her arms or legs, difficulty ambulating, difficulty speaking. Patient denies any alcohol use, smoking, illicit drug use.  The history is provided by the patient.    Past Medical History:  Diagnosis Date  . Headache(784.0)   . 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 Obesity     Patient Active Problem List   Diagnosis Date Noted  . HSIL (high grade squamous  intraepithelial lesion) on Pap smear of cervix 05/13/2016    Past Surgical History:  Procedure Laterality Date  . CESAREAN SECTION    . ORIF ANKLE FRACTURE Right 12/22/2012   Procedure: OPEN REDUCTION INTERNAL FIXATION (ORIF) ANKLE FRACTURE;  Surgeon: Mable ParisJustin William Chandler, MD;  Location: [facility];  Service: Orthopedics;  Laterality: Right;  . sweat gland      OB History    Gravida Para Term Preterm AB Living   1         1   SAB TAB Ectopic Multiple Live Births           1       Home Medications    Prior to Admission medications   Medication Sig Start Date End Date Taking? Authorizing Provider  norgestimate-ethinyl estradiol (ORTHO-CYCLEN,SPRINTEC,PREVIFEM) 0.25-35 MG-MCG tablet Take 1 tablet by mouth daily. 11/04/16  Yes Anyanwu, Jethro BastosUgonna A, MD  albuterol (PROVENTIL HFA;VENTOLIN HFA) 108 (90 Base) MCG/ACT inhaler Inhale 2 puffs into the lungs every 4 (four) hours as needed for wheezing or shortness of breath. 01/09/17   Maxwell CaulLayden, Gleb Mcguire A, PA-C  benzonatate (TESSALON) 100 MG capsule Take 1 capsule (100 mg total) by mouth every 8 (eight) hours. 01/09/17   Maxwell CaulLayden, Katherine Tout A, PA-C  ibuprofen (ADVIL,MOTRIN) 800 MG tablet Take 1 tablet (800 mg total) by mouth 3 (three) times daily with meals as needed for headache or moderate pain. Patient not taking: Reported on 01/08/2017 11/04/16   Anyanwu, Jethro BastosUgonna A, MD  ondansetron (ZOFRAN) 4 MG tablet Take 1 tablet (4 mg total) by mouth every 8 (eight) hours as needed for  nausea or vomiting. Patient not taking: Reported on 10/09/2016 08/13/16   Mumaw, Hiram Comber, DO  predniSONE (DELTASONE) 10 MG tablet Take 2 tablets (20 mg total) by mouth daily. 01/09/17   Maxwell Caul, PA-C    Family History Family History  Problem Relation Age of Onset  . Hypertension Mother   . Diabetes Mother   . Diabetes Maternal Grandmother   . Breast cancer Paternal Grandmother     Social History Social History  Substance Use Topics  . Smoking  status: Current Every Day Smoker    Packs/day: 0.50  . Smokeless tobacco: Never Used  . Alcohol use Yes     Comment: occ     Allergies   Latex   Review of Systems Review of Systems  Constitutional: Positive for chills. Negative for fever.  HENT: Positive for congestion, rhinorrhea and sore throat. Negative for drooling and trouble swallowing.   Eyes: Negative for visual disturbance.  Respiratory: Positive for cough. Negative for shortness of breath.   Cardiovascular: Negative for chest pain.  Gastrointestinal: Positive for abdominal pain, diarrhea, nausea and vomiting.  Genitourinary: Negative for dysuria, hematuria, vaginal bleeding and vaginal discharge.  Musculoskeletal: Negative for back pain and neck pain.  Skin: Negative for rash.  Neurological: Positive for light-headedness and headaches. Negative for dizziness, weakness and numbness.     Physical Exam Updated Vital Signs BP 134/83 (BP Location: Left Arm)   Pulse 79   Temp 98.5 F (36.9 C) (Oral)   Resp 16   Ht 5\' 8"  (1.727 m)   Wt 97.5 kg (215 lb)   SpO2 100%   BMI 32.69 kg/m   Physical Exam  Constitutional: She is oriented to person, place, and time. She appears well-developed and well-nourished.  Appears uncomfortable but no acute distress   HENT:  Head: Normocephalic and atraumatic.  Nose: Mucosal edema and rhinorrhea present.  Mouth/Throat: Uvula is midline and mucous membranes are normal. No trismus in the jaw. Posterior oropharyngeal edema and posterior oropharyngeal erythema present.  Posterior pharynx is mildly erythematous and edematous. No evidence of exudates. Uvula is midline. No trismus. No evidence of peritonsillar abscess. No facial or neck swelling.  Eyes: Pupils are equal, round, and reactive to light. Conjunctivae, EOM and lids are normal. Right eye exhibits no nystagmus. Left eye exhibits no nystagmus.  No periorbital tenderness bilaterally  Neck: Full passive range of motion without pain.  Neck supple. No neck rigidity.  Cardiovascular: Normal rate, regular rhythm, normal heart sounds and normal pulses.  Exam reveals no gallop and no friction rub.   No murmur heard. Pulmonary/Chest: Effort normal. She has wheezes. She has no rales.  No evidence of respiratory distress. Able to speak in full sentences without difficulty. Primary mild diffuse wheezing throughout all lung fields.   Abdominal: Soft. Normal appearance and bowel sounds are normal. There is tenderness in the periumbilical area and suprapubic area. There is no rigidity, no guarding, no CVA tenderness and no tenderness at McBurney's point.  Abdomen is soft, nondistended. Patient has some very mild tenderness to the periumbilical/suprapubic region. No CVA tenderness bilaterally. No peritoneal signs. No tenderness at McBurney's point.  Musculoskeletal: Normal range of motion.  Neurological: She is alert and oriented to person, place, and time. GCS eye subscore is 4. GCS verbal subscore is 5. GCS motor subscore is 6.  Cranial nerves III-XII intact Follows commands, Moves all extremities  5/5 strength to BUE and BLE  Sensation intact throughout all major nerve distributions Normal finger to  nose. No dysdiadochokinesia. No pronator drift. No gait abnormalities  No slurred speech. No facial droop.   Skin: Skin is warm and dry. Capillary refill takes less than 2 seconds.  Psychiatric: She has a normal mood and affect. Her speech is normal.  Nursing note and vitals reviewed.    ED Treatments / Results  Labs (all labs ordered are listed, but only abnormal results are displayed) Labs Reviewed  URINALYSIS, ROUTINE W REFLEX MICROSCOPIC - Abnormal; Notable for the following:       Result Value   APPearance HAZY (*)    Specific Gravity, Urine 1.034 (*)    Ketones, ur 5 (*)    Protein, ur 30 (*)    Leukocytes, UA SMALL (*)    Squamous Epithelial / LPF 6-30 (*)    All other components within normal limits  COMPREHENSIVE  METABOLIC PANEL - Abnormal; Notable for the following:    ALT 13 (*)    All other components within normal limits  CBC WITH DIFFERENTIAL/PLATELET - Abnormal; Notable for the following:    Monocytes Absolute 1.5 (*)    All other components within normal limits  RAPID STREP SCREEN (NOT AT [facility])  CULTURE, GROUP A STREP ([facility])  LIPASE, BLOOD  I-STAT BETA HCG BLOOD, ED ([facility], [facility], [facility] ONLY)    EKG  EKG Interpretation None       Radiology Dg Chest 2 View  Result Date: 01/08/2017 CLINICAL DATA:  Cough and dyspnea.  Diaphoresis. EXAM: CHEST  2 VIEW COMPARISON:  01/24/2016 FINDINGS: The lungs are clear. The pulmonary vasculature is normal. Heart size is normal. Hilar and mediastinal contours are unremarkable. There is no pleural effusion. IMPRESSION: No active cardiopulmonary disease. Electronically Signed   By: Ellery Plunk M.D.   On: 01/08/2017 23:47    Procedures Procedures (including critical care time)  Medications Ordered in ED Medications  ondansetron (ZOFRAN-ODT) disintegrating tablet 4 mg (4 mg Oral Given 01/08/17 2135)  sodium chloride 0.9 % bolus 1,000 mL (0 mLs Intravenous Stopped 01/09/17 0138)  ketorolac (TORADOL) 30 MG/ML injection 30 mg (30 mg Intravenous Given 01/09/17 0059)  ipratropium-albuterol (DUONEB) 0.5-2.5 (3) MG/3ML nebulizer solution 3 mL (3 mLs Nebulization Given 01/09/17 0059)     Initial Impression / Assessment and Plan / ED Course  I have reviewed the triage vital signs and the nursing notes.  Pertinent labs & imaging results that were available during my care of the patient were reviewed by me and considered in my medical decision making (see chart for details).     26 year old female who presents with 3 days of multiple complaints, including nasal congestion, rhinorrhea, nausea/vomiting/diarrhea, abdominal pain, headache. Patient is afebrile, non-toxic appearing, sitting comfortably on examination table. Vital signs reviewed and stable. No neuro deficits.  No red flag symptoms. Consider all viral in etiology versus URI versus bronchitis versus pneumonia versus sinusitis. History/physical exam are not concerning for appendicitis or diverticulitis. Suspect headache is secondary to subtherapeutic medication use and secondary sinus pressure/congestion. Do not suspect ICH, meningitis based on history/physical exam, reassuring physical exam with no neuro deficits. Plan to check basic labs including UA, CBC, CMP, lipase, beta hCG, rapid strep, chest x-ray. IVF given for fluid resuscitation. Analgesics provided in the department.  Labs reviewed. Beta hCG is negative. Lipase unremarkable. CBC unremarkable. CMP unremarkable. Rapid strep is negative. UA shows small leukocyte. Patient is not symptomatic.  Chest x-ray negative for any acute infectious etiology. Dscussed results with patient. Reevaluation. Patient has not gotten pain medications. Patient states  that she feels like she is wheezing slightly. Repeat lung exam shows some mild diffuse wheezing throughout all lung fields. Will plan to give nebulizer treatment.  Patient signed out to [employer], PA-C pending completion of nebulizer treatment and re-evaluation after pain medications. Plan to provide symptomatic relief for URI. Plan is to discharge patient home with outpatient follow-up. Updated patient on plan. Strict return precautions discussed. Patient expreses understanding and agreement to plan.     Final Clinical Impressions(s) / ED Diagnoses   Final diagnoses:  Upper respiratory tract infection, unspecified type  Acute nonintractable headache, unspecified headache type  Lower abdominal pain  Nausea vomiting and diarrhea    New Prescriptions Discharge Medication List as of 01/09/2017  1:23 AM    START taking these medications   Details  albuterol (PROVENTIL HFA;VENTOLIN HFA) 108 (90 Base) MCG/ACT inhaler Inhale 2 puffs into the lungs every 4 (four) hours as needed for wheezing or shortness of  breath., Starting Sat 01/09/2017, Print    benzonatate (TESSALON) 100 MG capsule Take 1 capsule (100 mg total) by mouth every 8 (eight) hours., Starting Sat 01/09/2017, Print    predniSONE (DELTASONE) 10 MG tablet Take 2 tablets (20 mg total) by mouth daily., Starting Sat 01/09/2017, Print         Maxwell Caul, PA-C 01/09/17 5329    Cathren Laine, MD 01/09/17 [phone]

## 2017-01-08 NOTE — ED Triage Notes (Signed)
Pt is c/o of N/V/D x 3  days with associated HA 10/10 described as pressure pt denies taking any medication,  dizziness, and diaphoretic.

## 2017-01-09 LAB — COMPREHENSIVE METABOLIC PANEL
ALT: 13 U/L — AB (ref 14–54)
AST: 15 U/L (ref 15–41)
Albumin: 3.6 g/dL (ref 3.5–5.0)
Alkaline Phosphatase: 73 U/L (ref 38–126)
Anion gap: 7 (ref 5–15)
BUN: 13 mg/dL (ref 6–20)
CHLORIDE: 107 mmol/L (ref 101–111)
CO2: 27 mmol/L (ref 22–32)
CREATININE: 0.75 mg/dL (ref 0.44–1.00)
Calcium: 9.1 mg/dL (ref 8.9–10.3)
GFR calc Af Amer: >60 mL/min (ref >60)
GFR calc non Af Amer: >60 mL/min (ref >60)
Glucose, Bld: 96 mg/dL (ref 65–99)
Potassium: 3.8 mmol/L (ref 3.5–5.1)
SODIUM: 141 mmol/L (ref 135–145)
Total Bilirubin: 0.5 mg/dL (ref 0.3–1.2)
Total Protein: 7.5 g/dL (ref 6.5–8.1)

## 2017-01-09 LAB — CBC WITH DIFFERENTIAL/PLATELET
BASOS PCT: 1 %
Basophils Absolute: 0.1 10*3/uL (ref 0.0–0.1)
EOS ABS: 0.4 10*3/uL (ref 0.0–0.7)
EOS PCT: 4 %
HCT: 38.9 % (ref 36.0–46.0)
HEMOGLOBIN: 12.7 g/dL (ref 12.0–15.0)
LYMPHS ABS: 2.8 10*3/uL (ref 0.7–4.0)
Lymphocytes Relative: 35 %
MCH: 28.2 pg (ref 26.0–34.0)
MCHC: 32.6 g/dL (ref 30.0–36.0)
MCV: 86.3 fL (ref 78.0–100.0)
Monocytes Absolute: 1.5 10*3/uL — ABNORMAL HIGH (ref 0.1–1.0)
Monocytes Relative: 19 %
NEUTROS PCT: 41 %
Neutro Abs: 3.4 10*3/uL (ref 1.7–7.7)
PLATELETS: 275 10*3/uL (ref 150–400)
RBC: 4.51 MIL/uL (ref 3.87–5.11)
RDW: 13.6 % (ref 11.5–15.5)
WBC: 8.1 10*3/uL (ref 4.0–10.5)

## 2017-01-09 LAB — I-STAT BETA HCG BLOOD, ED (MC, WL, AP ONLY)

## 2017-01-09 LAB — LIPASE, BLOOD: LIPASE: 28 U/L (ref 11–51)

## 2017-01-09 LAB — RAPID STREP SCREEN (MED CTR MEBANE ONLY): STREPTOCOCCUS, GROUP A SCREEN (DIRECT): NEGATIVE

## 2017-01-09 MED ORDER — PREDNISONE 10 MG PO TABS: 20.0000 mg | ORAL_TABLET | Freq: Every day | ORAL | 0 refills | Status: DC

## 2017-01-09 MED ORDER — IPRATROPIUM-ALBUTEROL 0.5-2.5 (3) MG/3ML IN SOLN
3.0000 mL | Freq: Once | RESPIRATORY_TRACT | Status: AC
  Administered 2017-01-09: 3 mL via RESPIRATORY_TRACT
  Filled 2017-01-09: qty 3

## 2017-01-09 MED ORDER — ALBUTEROL SULFATE HFA 108 (90 BASE) MCG/ACT IN AERS: 2.0000 | INHALATION_SPRAY | RESPIRATORY_TRACT | 0 refills | Status: DC | PRN

## 2017-01-09 MED ORDER — BENZONATATE 100 MG PO CAPS: 100.0000 mg | ORAL_CAPSULE | Freq: Three times a day (TID) | ORAL | 0 refills | Status: DC

## 2017-01-09 MED ORDER — KETOROLAC TROMETHAMINE 30 MG/ML IJ SOLN
30.0000 mg | Freq: Once | INTRAMUSCULAR | Status: AC
  Administered 2017-01-09: 30 mg via INTRAVENOUS
  Filled 2017-01-09: qty 1

## 2017-01-09 MED ORDER — ALBUTEROL SULFATE HFA 108 (90 BASE) MCG/ACT IN AERS
2.0000 | INHALATION_SPRAY | RESPIRATORY_TRACT | Status: DC | PRN
  Administered 2017-01-09: 2 via RESPIRATORY_TRACT
  Filled 2017-01-09: qty 6.7

## 2017-01-09 NOTE — ED Notes (Signed)
Pt ambulated in hallway without assistance. Pt O2 was 98%

## 2017-01-09 NOTE — ED Notes (Signed)
PT DISCHARGED. INSTRUCTIONS AND PRESCRIPTIONS GIVEN. AAOX4. PT IN NO APPARENT DISTRESS WITH MODERATE PAIN. THE OPPORTUNITY TO ASK QUESTIONS WAS PROVIDED. 

## 2017-01-09 NOTE — ED Provider Notes (Signed)
1:57 AM Patient reassessed.  Appears stable.  Labs and imaging are reassuring. Ambulates maintaining 98% O2 sat.  Likely viral.  Will give inhaler here and discharge to home with PCP follow-up.  Return precautions given.   Roxy HorsemanBrowning, Samari Gorby, PA-C 01/09/17 [id number]

## 2017-01-09 NOTE — Discharge Instructions (Signed)
You can take Tylenol or Ibuprofen as directed for pain or headache.  Use the albuterol inhaler as directed.   Take prednisone as directed.   Take the [facility] as directed for cough.  Follow-up with your primary care doctor in the next 2-4 days for further evaluation.   Return to the emergency department for any worsening headache, difficulty walking, numbness/weakness of her arms or legs, worsening abdominal pain, persistent nausea/vomiting, blood in her stool or vomit, fever, chest pain, difficulty breathing or any other worsening or concerning symptoms.

## 2017-01-11 LAB — CULTURE, GROUP A STREP (THRC)

## 2017-05-11 NOTE — L&D Delivery Note (Signed)
OB/GYN Faculty Practice Delivery Note  Deirdre Evenershley C Arneson is a 27 y.o. now G2P2002 s/p VBAC at 8223w2d. She was admitted for IOL for A2GDM. Pregnancy also complicated by TOLAC, obesity.   ROM: 12h 9065m with clear fluid GBS Status: negative Maximum Maternal Temperature: Temp (24hrs), Avg:98.3 F (36.8 C), Min:97.6 F (36.4 C), Max:99 F (37.2 C)  Labor Progress: . Admitted 8/19 for IOL, started on pitocin . FB placed  . Titrated to max pitocin 30   Delivery: Called to room and patient was complete and pushing. Head delivered LOA. Loose body cord present. Shoulder and body delivered in usual fashion. Infant with spontaneous cry, placed on mother's abdomen, dried and stimulated. Cord clamped x 2 after 1-minute delay, and cut by father of baby. Cord blood drawn. Placenta delivered spontaneously with gentle cord traction. Fundus firm with massage and Pitocin. Labia, perineum, vagina, and cervix inspected inspected with 1st degree perineal noted.   Placenta: spontaneous, intact, 3-vessel cord Complications: none Lacerations: 1st degree perineal, repaired with 4-0 Vicryl EBL: 300cc  Postpartum Planning [x]  message to sent to schedule follow-up  [x]  vaccines UTD  Infant: viable, APGAR 8,9 and weight pending   Laurel S. Earlene PlaterWallace, DO OB/GYN Fellow, Faculty Practice

## 2017-05-25 ENCOUNTER — Ambulatory Visit (HOSPITAL_COMMUNITY): Payer: Self-pay

## 2017-05-27 ENCOUNTER — Encounter: Payer: Self-pay | Admitting: *Deleted

## 2017-05-27 ENCOUNTER — Ambulatory Visit (INDEPENDENT_AMBULATORY_CARE_PROVIDER_SITE_OTHER): Payer: Self-pay | Admitting: *Deleted

## 2017-05-27 DIAGNOSIS — Z3480 Encounter for supervision of other normal pregnancy, unspecified trimester: Secondary | ICD-10-CM

## 2017-05-27 DIAGNOSIS — Z3200 Encounter for pregnancy test, result unknown: Secondary | ICD-10-CM

## 2017-05-27 DIAGNOSIS — Z3201 Encounter for pregnancy test, result positive: Secondary | ICD-10-CM

## 2017-05-27 LAB — POCT PREGNANCY, URINE: PREG TEST UR: POSITIVE — AB

## 2017-05-27 MED ORDER — PRENATAL PLUS 27-1 MG PO TABS: 1.0000 | ORAL_TABLET | Freq: Every day | ORAL | 9 refills | Status: DC

## 2017-05-27 NOTE — Progress Notes (Signed)
Here for pregnancy test which was positive. States wasn't planned for now because just had cryotheraphy 6 months ago.  States LMP 02/22/17.States sure of that period date but had irregular periods due to switching from implanon to sprintec. States stopped taking sprintec when had home positive pregnancy test 04/29/17. States plan to go here. Sent to front desk to make appt for new ob and lab only appt.

## 2017-06-01 ENCOUNTER — Other Ambulatory Visit: Payer: Self-pay

## 2017-06-01 ENCOUNTER — Other Ambulatory Visit: Payer: Self-pay | Admitting: *Deleted

## 2017-06-01 DIAGNOSIS — Z3400 Encounter for supervision of normal first pregnancy, unspecified trimester: Secondary | ICD-10-CM

## 2017-06-10 ENCOUNTER — Ambulatory Visit (INDEPENDENT_AMBULATORY_CARE_PROVIDER_SITE_OTHER): Payer: Medicaid Other | Admitting: Student

## 2017-06-10 ENCOUNTER — Encounter: Payer: Self-pay | Admitting: Student

## 2017-06-10 ENCOUNTER — Ambulatory Visit: Payer: Self-pay

## 2017-06-10 VITALS — BP 124/77 | HR 83 | Wt 254.0 lb

## 2017-06-10 DIAGNOSIS — O34219 Maternal care for unspecified type scar from previous cesarean delivery: Secondary | ICD-10-CM | POA: Insufficient documentation

## 2017-06-10 DIAGNOSIS — F17200 Nicotine dependence, unspecified, uncomplicated: Secondary | ICD-10-CM

## 2017-06-10 DIAGNOSIS — Z3480 Encounter for supervision of other normal pregnancy, unspecified trimester: Secondary | ICD-10-CM | POA: Insufficient documentation

## 2017-06-10 DIAGNOSIS — Z3481 Encounter for supervision of other normal pregnancy, first trimester: Secondary | ICD-10-CM

## 2017-06-10 DIAGNOSIS — O3680X0 Pregnancy with inconclusive fetal viability, not applicable or unspecified: Secondary | ICD-10-CM

## 2017-06-10 DIAGNOSIS — R87613 High grade squamous intraepithelial lesion on cytologic smear of cervix (HGSIL): Secondary | ICD-10-CM

## 2017-06-10 DIAGNOSIS — Z9889 Other specified postprocedural states: Secondary | ICD-10-CM

## 2017-06-10 DIAGNOSIS — Z98891 History of uterine scar from previous surgery: Secondary | ICD-10-CM

## 2017-06-10 LAB — POCT URINALYSIS DIP (DEVICE)
Bilirubin Urine: NEGATIVE
Glucose, UA: NEGATIVE mg/dL
Leukocytes, UA: NEGATIVE
Nitrite: NEGATIVE
PH: 6 (ref 5.0–8.0)
Protein, ur: 30 mg/dL — AB
Urobilinogen, UA: 1 mg/dL (ref 0.0–1.0)

## 2017-06-10 NOTE — Patient Instructions (Signed)

## 2017-06-10 NOTE — Progress Notes (Signed)
Pt informed that the ultrasound is considered a limited OB ultrasound and is not intended to be a complete ultrasound exam.  Patient also informed that the ultrasound is not being completed with the intent of assessing for fetal or placental anomalies or any pelvic abnormalities.  Explained that the purpose of today's ultrasound is to assess for viability and estimated gestational age.  Patient acknowledges the purpose of the exam and the limitations of the study.    

## 2017-06-11 DIAGNOSIS — Z9889 Other specified postprocedural states: Secondary | ICD-10-CM | POA: Insufficient documentation

## 2017-06-11 DIAGNOSIS — Z716 Tobacco abuse counseling: Secondary | ICD-10-CM | POA: Insufficient documentation

## 2017-06-11 DIAGNOSIS — F17200 Nicotine dependence, unspecified, uncomplicated: Secondary | ICD-10-CM | POA: Insufficient documentation

## 2017-06-11 HISTORY — DX: Other specified postprocedural states: Z98.890

## 2017-06-11 LAB — OBSTETRIC PANEL, INCLUDING HIV
Antibody Screen: NEGATIVE
BASOS ABS: 0 10*3/uL (ref 0.0–0.2)
BASOS: 0 %
EOS (ABSOLUTE): 0.1 10*3/uL (ref 0.0–0.4)
Eos: 1 %
HEMATOCRIT: 38.1 % (ref 34.0–46.6)
HIV Screen 4th Generation wRfx: NONREACTIVE
Hemoglobin: 13 g/dL (ref 11.1–15.9)
Hepatitis B Surface Ag: NEGATIVE
IMMATURE GRANS (ABS): 0 10*3/uL (ref 0.0–0.1)
IMMATURE GRANULOCYTES: 0 %
LYMPHS: 24 %
Lymphocytes Absolute: 2.6 10*3/uL (ref 0.7–3.1)
MCH: 29.7 pg (ref 26.6–33.0)
MCHC: 34.1 g/dL (ref 31.5–35.7)
MCV: 87 fL (ref 79–97)
MONOCYTES: 12 %
Monocytes Absolute: 1.3 10*3/uL — ABNORMAL HIGH (ref 0.1–0.9)
Neutrophils Absolute: 6.7 10*3/uL (ref 1.4–7.0)
Neutrophils: 63 %
Platelets: 307 10*3/uL (ref 150–379)
RBC: 4.38 x10E6/uL (ref 3.77–5.28)
RDW: 13.6 % (ref 12.3–15.4)
RPR Ser Ql: NONREACTIVE
Rh Factor: POSITIVE
Rubella Antibodies, IGG: 3.16 index (ref >0.99)
WBC: 10.6 10*3/uL (ref 3.4–10.8)

## 2017-06-11 NOTE — Progress Notes (Signed)
  Subjective:    Susan English is being seen today for her first obstetrical visit.  This is not a planned pregnancy. She is at 7520w4d gestation. Her obstetrical history is significant for H/o c-section and HSIL. Marland Kitchen. She had a cryo done in June 2018; she is also a smoker.  Relationship with FOB: not involved right now. . Patient does intend to breast feed. Pregnancy history fully reviewed.  Patient reports no complaints.  Patient does not know how far along she is. She thinks she could be 13 or 15 weeks. She says her csection before was because she didn't dilate.   She had a cryotherapy appt in June 2018; she says she needs a pap in December 2018.   Review of Systems:   Review of Systems  Constitutional: Negative.   HENT: Negative.   Eyes: Negative.   Respiratory: Negative.   Cardiovascular: Negative.   Endocrine: Negative.   Genitourinary: Negative.   Neurological: Negative.   Hematological: Negative.     Objective:     BP 124/77   Pulse 83   Wt 254 lb (115.2 kg)   LMP 02/22/2017 (Approximate)   BMI 38.62 kg/m  Physical Exam  Constitutional: She is oriented to person, place, and time. She appears well-developed and well-nourished.  HENT:  Head: Normocephalic.  Neck: Normal range of motion.  Respiratory: Effort normal.  GI: Soft.  Genitourinary: Vagina normal.  Musculoskeletal: Normal range of motion.  Neurological: She is alert and oriented to person, place, and time.  Skin: Skin is warm and dry.    Exam    Assessment:    Pregnancy: G2P1001 Patient Active Problem List   Diagnosis Date Noted  . Supervision of other normal pregnancy, antepartum 06/10/2017  . History of C-section 06/10/2017  . HSIL (high grade squamous intraepithelial lesion) on Pap smear of cervix 05/13/2016       Plan:     Initial labs drawn. Prenatal vitamins. Problem list reviewed and updated. AFP3 discussed: requested. Role of ultrasound in pregnancy discussed; fetal survey: will  order at next visit. . Amniocentesis discussed: not indicated. Follow up in 4 weeks. 75% of 30 min visit spent on counseling and coordination of care.  -NOB labs drawn -Urine culture not done today as patient said she couldn't urinate -SMA and panorama done -US shows fetus with CRL of 11 weeks and 3 days; will use this as the current gestational age.  Of note, there is a large mass in the lower uterine segment, possible representing a fibroid. MD review is pending.  -Pap at next visit -Patient is interested in TOLAC  Southwest AirlinesKathryn Lorraine Herbert Aguinaldo 06/11/2017

## 2017-06-18 LAB — SMN1 COPY NUMBER ANALYSIS (SMA CARRIER SCREENING)

## 2017-06-22 ENCOUNTER — Encounter: Payer: Self-pay | Admitting: *Deleted

## 2017-06-23 ENCOUNTER — Encounter: Payer: Self-pay | Admitting: Family Medicine

## 2017-07-09 ENCOUNTER — Encounter: Payer: Self-pay | Admitting: Obstetrics and Gynecology

## 2017-07-13 ENCOUNTER — Encounter: Payer: Self-pay | Admitting: Obstetrics and Gynecology

## 2017-07-29 ENCOUNTER — Encounter: Payer: Self-pay | Admitting: Family Medicine

## 2017-07-29 ENCOUNTER — Ambulatory Visit (INDEPENDENT_AMBULATORY_CARE_PROVIDER_SITE_OTHER): Payer: Medicaid Other | Admitting: Medical

## 2017-07-29 ENCOUNTER — Other Ambulatory Visit (HOSPITAL_COMMUNITY)
Admission: RE | Admit: 2017-07-29 | Discharge: 2017-07-29 | Disposition: A | Discharge status: Home / Self Care | Payer: Medicaid Other | Source: Ambulatory Visit | Attending: Medical | Admitting: Medical

## 2017-07-29 VITALS — BP 115/69 | HR 86 | Wt 265.4 lb

## 2017-07-29 DIAGNOSIS — Z98891 History of uterine scar from previous surgery: Secondary | ICD-10-CM

## 2017-07-29 DIAGNOSIS — R87621 Atypical squamous cells cannot exclude high grade squamous intraepithelial lesion on cytologic smear of vagina (ASC-H): Secondary | ICD-10-CM | POA: Diagnosis not present

## 2017-07-29 DIAGNOSIS — O3480 Maternal care for other abnormalities of pelvic organs, unspecified trimester: Secondary | ICD-10-CM | POA: Diagnosis not present

## 2017-07-29 DIAGNOSIS — Z3480 Encounter for supervision of other normal pregnancy, unspecified trimester: Secondary | ICD-10-CM

## 2017-07-29 DIAGNOSIS — Z3A00 Weeks of gestation of pregnancy not specified: Secondary | ICD-10-CM | POA: Diagnosis not present

## 2017-07-29 DIAGNOSIS — Z9889 Other specified postprocedural states: Secondary | ICD-10-CM

## 2017-07-29 DIAGNOSIS — Z3482 Encounter for supervision of other normal pregnancy, second trimester: Secondary | ICD-10-CM

## 2017-07-29 DIAGNOSIS — R87613 High grade squamous intraepithelial lesion on cytologic smear of cervix (HGSIL): Secondary | ICD-10-CM

## 2017-07-29 NOTE — Patient Instructions (Signed)
Second Trimester of Pregnancy The second trimester is from week 13 through week 28, month 4 through 6. This is often the time in pregnancy that you feel your best. Often times, morning sickness has lessened or quit. You may have more energy, and you may get hungry more often. Your unborn baby (fetus) is growing rapidly. At the end of the sixth month, he or she is about 9 inches long and weighs about 1 pounds. You will likely feel the baby move (quickening) between 18 and 20 weeks of pregnancy. Follow these instructions at home:  Avoid all smoking, herbs, and alcohol. Avoid drugs not approved by your doctor.  Do not use any tobacco products, including cigarettes, chewing tobacco, and electronic cigarettes. If you need help quitting, ask your doctor. You may get counseling or other support to help you quit.  Only take medicine as told by your doctor. Some medicines are safe and some are not during pregnancy.  Exercise only as told by your doctor. Stop exercising if you start having cramps.  Eat regular, healthy meals.  Wear a good support bra if your breasts are tender.  Do not use hot tubs, steam rooms, or saunas.  Wear your seat belt when driving.  Avoid raw meat, uncooked cheese, and liter boxes and soil used by cats.  Take your prenatal vitamins.  Take 1500-2000 milligrams of calcium daily starting at the 20th week of pregnancy until you deliver your baby.  Try taking medicine that helps you poop (stool softener) as needed, and if your doctor approves. Eat more fiber by eating fresh fruit, vegetables, and whole grains. Drink enough fluids to keep your pee (urine) clear or pale yellow.  Take warm water baths (sitz baths) to soothe pain or discomfort caused by hemorrhoids. Use hemorrhoid cream if your doctor approves.  If you have puffy, bulging veins (varicose veins), wear support hose. Raise (elevate) your feet for 15 minutes, 3-4 times a day. Limit salt in your diet.  Avoid heavy  lifting, wear low heals, and sit up straight.  Rest with your legs raised if you have leg cramps or low back pain.  Visit your dentist if you have not gone during your pregnancy. Use a soft toothbrush to brush your teeth. Be gentle when you floss.  You can have sex (intercourse) unless your doctor tells you not to.  Go to your doctor visits. Get help if:  You feel dizzy.  You have mild cramps or pressure in your lower belly (abdomen).  You have a nagging pain in your belly area.  You continue to feel sick to your stomach (nauseous), throw up (vomit), or have watery poop (diarrhea).  You have bad smelling fluid coming from your vagina.  You have pain with peeing (urination). Get help right away if:  You have a fever.  You are leaking fluid from your vagina.  You have spotting or bleeding from your vagina.  You have severe belly cramping or pain.  You lose or gain weight rapidly.  You have trouble catching your breath and have chest pain.  You notice sudden or extreme puffiness (swelling) of your face, hands, ankles, feet, or legs.  You have not felt the baby move in over an hour.  You have severe headaches that do not go away with medicine.  You have vision changes. This information is not intended to replace advice given to you by your health care provider. Make sure you discuss any questions you have with your health care   provider. Document Released: 07/22/2009 Document Revised: 10/03/2015 Document Reviewed: 06/28/2012 Elsevier Interactive Patient Education  2017 [employer]  

## 2017-07-29 NOTE — Progress Notes (Signed)
   PRENATAL VISIT NOTE  Subjective:  Susan English is a 27 y.o. G2P1001 at 537w3d being seen today for ongoing prenatal care.  She is currently monitored for the following issues for this low-risk pregnancy and has HSIL (high grade squamous intraepithelial lesion) on Pap smear of cervix; Supervision of other normal pregnancy, antepartum; History of C-section; Status post cryotherapy of skin lesion; and Smoker on their problem list.  Patient reports no complaints.  Contractions: Not present. Vag. Bleeding: None.  Movement: Present. Denies leaking of fluid.   The following portions of the patient's history were reviewed and updated as appropriate: allergies, current medications, past family history, past medical history, past social history, past surgical history and problem list. Problem list updated.  Objective:   Vitals:   07/29/17 0948  BP: 115/69  Pulse: 86  Weight: 265 lb 6.4 oz (120.4 kg)    Fetal Status: Fetal Heart Rate (bpm): 155   Movement: Present     General:  Alert, oriented and cooperative. Patient is in no acute distress.  Skin: Skin is warm and dry. No rash noted.   Cardiovascular: Normal heart rate noted  Respiratory: Normal respiratory effort, no problems with respiration noted  Abdomen: Soft, gravid, appropriate for gestational age.  Pain/Pressure: Present     Pelvic: Cervical exam deferred        Extremities: Normal range of motion.  Edema: None  Mental Status:  Normal mood and affect. Normal behavior. Normal judgment and thought content.   Assessment and Plan:  Pregnancy: G2P1001 at 797w3d  1. Supervision of other normal pregnancy, antepartum - Cytology - PAP - Culture, OB Urine - US MFM OB COMP + 14 WK; scheduled - Patient requesting Panorama results be called to her cousin - Georgette Shellngela George, she does not want to know the sex of the baby yet  2. HSIL (high grade squamous intraepithelial lesion) on Pap smear of cervix - Pap smear today   3. Status post  cryotherapy   4. History of C-section - Planning TOLAC, needs consent at 28 weeks  Preterm labor/second trimester warning symptoms and general obstetric precautions including but not limited to vaginal bleeding, contractions, leaking of fluid and fetal movement were reviewed in detail with the patient. Please refer to After Visit Summary for other counseling recommendations.  Return in about 1 month (around 08/26/2017) for LOB.   Vonzella NippleJulie Ira Busbin, PA-C

## 2017-08-03 ENCOUNTER — Ambulatory Visit (HOSPITAL_COMMUNITY)
Admission: RE | Admit: 2017-08-03 | Discharge: 2017-08-03 | Disposition: A | Discharge status: Home / Self Care | Payer: Medicaid Other | Source: Ambulatory Visit | Attending: Medical | Admitting: Medical

## 2017-08-03 ENCOUNTER — Other Ambulatory Visit: Payer: Self-pay | Admitting: Medical

## 2017-08-03 DIAGNOSIS — O99212 Obesity complicating pregnancy, second trimester: Secondary | ICD-10-CM

## 2017-08-03 DIAGNOSIS — O99332 Smoking (tobacco) complicating pregnancy, second trimester: Secondary | ICD-10-CM | POA: Insufficient documentation

## 2017-08-03 DIAGNOSIS — E669 Obesity, unspecified: Secondary | ICD-10-CM | POA: Diagnosis not present

## 2017-08-03 DIAGNOSIS — Z3A19 19 weeks gestation of pregnancy: Secondary | ICD-10-CM

## 2017-08-03 DIAGNOSIS — O321XX0 Maternal care for breech presentation, not applicable or unspecified: Secondary | ICD-10-CM | POA: Insufficient documentation

## 2017-08-03 DIAGNOSIS — Z3480 Encounter for supervision of other normal pregnancy, unspecified trimester: Secondary | ICD-10-CM

## 2017-08-03 DIAGNOSIS — F17200 Nicotine dependence, unspecified, uncomplicated: Secondary | ICD-10-CM | POA: Insufficient documentation

## 2017-08-03 DIAGNOSIS — O34219 Maternal care for unspecified type scar from previous cesarean delivery: Secondary | ICD-10-CM

## 2017-08-03 DIAGNOSIS — Z3689 Encounter for other specified antenatal screening: Secondary | ICD-10-CM

## 2017-08-03 DIAGNOSIS — O3440 Maternal care for other abnormalities of cervix, unspecified trimester: Secondary | ICD-10-CM | POA: Diagnosis not present

## 2017-08-03 LAB — CYTOLOGY - PAP
Chlamydia: NEGATIVE
HPV: DETECTED — AB
Neisseria Gonorrhea: NEGATIVE

## 2017-08-10 ENCOUNTER — Telehealth: Payer: Self-pay

## 2017-08-10 NOTE — Telephone Encounter (Addendum)
-----   Message from Marny LowensteinJulie N Wenzel, PA-C sent at 08/03/2017 10:36 AM EDT ----- Pap smear was abnormal again. Patient will need Colpo at upcoming OB visit. Please inform patient and add to appointment note with appropriate provider.   Marny LowensteinWenzel, Julie N, PA-C 08/03/2017 10:36 AM  Notified pt of abnormal pap results and the need for colpo at next OB visit on 08/26/17 @ 1055.  Pt stated understanding.

## 2017-08-26 ENCOUNTER — Encounter: Payer: Self-pay | Admitting: Medical

## 2017-08-31 ENCOUNTER — Telehealth: Payer: Self-pay | Admitting: General Practice

## 2017-08-31 NOTE — Telephone Encounter (Signed)
Called and notified patient of follow up appointment on 09/15/17 at 1:15pm.  Patient voiced understanding.

## 2017-09-15 ENCOUNTER — Ambulatory Visit (INDEPENDENT_AMBULATORY_CARE_PROVIDER_SITE_OTHER): Payer: Medicaid Other | Admitting: Medical

## 2017-09-15 ENCOUNTER — Encounter: Payer: Self-pay | Admitting: General Practice

## 2017-09-15 VITALS — BP 110/60 | HR 89 | Wt 267.6 lb

## 2017-09-15 DIAGNOSIS — IMO0002 Reserved for concepts with insufficient information to code with codable children: Secondary | ICD-10-CM

## 2017-09-15 DIAGNOSIS — Z0489 Encounter for examination and observation for other specified reasons: Secondary | ICD-10-CM

## 2017-09-15 NOTE — Progress Notes (Signed)
   PRENATAL VISIT NOTE  Subjective:  Susan English is a 27 y.o. G2P1001 at [redacted]w[redacted]d being seen today for ongoing prenatal care.  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  Patient reports no complaints.  Contractions: Not present. Vag. Bleeding: None.  Movement: Present. Denies leaking of fluid.   The following portions of the patient's history were reviewed and updated as appropriate: allergies, current medications, past family history, past medical history, past social history, past surgical history and problem list. Problem list updated.  Objective:   Vitals:   09/15/17 1324  BP: 110/60  Pulse: 89  Weight: 267 lb 9.6 oz (121.4 kg)    Fetal Status: Fetal Heart Rate (bpm): 147 Fundal Height: 26 cm Movement: Present     General:  Alert, oriented and cooperative. Patient is in no acute distress.  Skin: Skin is warm and dry. No rash noted.   Cardiovascular: Normal heart rate noted  Respiratory: Normal respiratory effort, no problems with respiration noted  Abdomen: Soft, gravid, appropriate for gestational age.  Pain/Pressure: Present     Pelvic: Cervical exam deferred        Extremities: Normal range of motion.  Edema: Trace  Mental Status: Normal mood and affect. Normal behavior. Normal judgment and thought content.      GYNECOLOGY CLINIC COLPOSCOPY PROCEDURE NOTE  Ms. Susan English is a 27 y.o. G2P1001 here for colposcopy for ASC cannot exclude high grade lesion [facility]) pap smear on 07/2017. Discussed role for HPV in cervical dysplasia, need for  surveillance.  Patient given informed consent, signed copy in the chart, time out was performed.  Placed in lithotomy position. Cervix viewed with speculum and colposcope after application of acetic acid.   Colposcopy adequate? No  no visible lesions, no mosaicism, no punctation and no abnormal vasculature; NO biopsies obtained.  NO ECC specimen obtained. All specimens were labelled and sent to pathology.   Patient was given post procedure instructions.  Will follow up pathology and manage accordingly.  Routine preventative health maintenance measures emphasized.   Assessment and Plan:  Pregnancy: G2P1001 at [redacted]w[redacted]d  1. Evaluate anatomy not seen on prior sonogram - [place] MFM OB FOLLOW UP; scheduled  2. ASC-H Pap - Colpo today without biopsy needed. Repeat pap smear in 1 year  Preterm labor symptoms and general obstetric precautions including but not limited to vaginal bleeding, contractions, leaking of fluid and fetal movement were reviewed in detail with the patient. Please refer to After Visit Summary for other counseling recommendations.  Return in about 3 weeks (around 10/06/2017) for LOB, 28 week labs (fasting).  Future Appointments  Date Time Provider Department Center  09/23/2017  4:00 PM [facility] [place] 3 [facility]    Vonzella Nipple, PA-C

## 2017-09-15 NOTE — Patient Instructions (Signed)
Second Trimester of Pregnancy The second trimester is from week 13 through week 28, month 4 through 6. This is often the time in pregnancy that you feel your best. Often times, morning sickness has lessened or quit. You may have more energy, and you may get hungry more often. Your unborn baby (fetus) is growing rapidly. At the end of the sixth month, he or she is about 9 inches long and weighs about 1 pounds. You will likely feel the baby move (quickening) between 18 and 20 weeks of pregnancy. Follow these instructions at home:  Avoid all smoking, herbs, and alcohol. Avoid drugs not approved by your doctor.  Do not use any tobacco products, including cigarettes, chewing tobacco, and electronic cigarettes. If you need help quitting, ask your doctor. You may get counseling or other support to help you quit.  Only take medicine as told by your doctor. Some medicines are safe and some are not during pregnancy.  Exercise only as told by your doctor. Stop exercising if you start having cramps.  Eat regular, healthy meals.  Wear a good support bra if your breasts are tender.  Do not use hot tubs, steam rooms, or saunas.  Wear your seat belt when driving.  Avoid raw meat, uncooked cheese, and liter boxes and soil used by cats.  Take your prenatal vitamins.  Take 1500-2000 milligrams of calcium daily starting at the 20th week of pregnancy until you deliver your baby.  Try taking medicine that helps you poop (stool softener) as needed, and if your doctor approves. Eat more fiber by eating fresh fruit, vegetables, and whole grains. Drink enough fluids to keep your pee (urine) clear or pale yellow.  Take warm water baths (sitz baths) to soothe pain or discomfort caused by hemorrhoids. Use hemorrhoid cream if your doctor approves.  If you have puffy, bulging veins (varicose veins), wear support hose. Raise (elevate) your feet for 15 minutes, 3-4 times a day. Limit salt in your diet.  Avoid heavy  lifting, wear low heals, and sit up straight.  Rest with your legs raised if you have leg cramps or low back pain.  Visit your dentist if you have not gone during your pregnancy. Use a soft toothbrush to brush your teeth. Be gentle when you floss.  You can have sex (intercourse) unless your doctor tells you not to.  Go to your doctor visits. Get help if:  You feel dizzy.  You have mild cramps or pressure in your lower belly (abdomen).  You have a nagging pain in your belly area.  You continue to feel sick to your stomach (nauseous), throw up (vomit), or have watery poop (diarrhea).  You have bad smelling fluid coming from your vagina.  You have pain with peeing (urination). Get help right away if:  You have a fever.  You are leaking fluid from your vagina.  You have spotting or bleeding from your vagina.  You have severe belly cramping or pain.  You lose or gain weight rapidly.  You have trouble catching your breath and have chest pain.  You notice sudden or extreme puffiness (swelling) of your face, hands, ankles, feet, or legs.  You have not felt the baby move in over an hour.  You have severe headaches that do not go away with medicine.  You have vision changes. This information is not intended to replace advice given to you by your health care provider. Make sure you discuss any questions you have with your health care   provider. Document Released: 07/22/2009 Document Revised: 10/03/2015 Document Reviewed: 06/28/2012 Elsevier Interactive Patient Education  2017 [employer]  

## 2017-09-23 ENCOUNTER — Other Ambulatory Visit: Payer: Self-pay | Admitting: Medical

## 2017-09-23 ENCOUNTER — Ambulatory Visit (HOSPITAL_COMMUNITY)
Admission: RE | Admit: 2017-09-23 | Discharge: 2017-09-23 | Disposition: A | Discharge status: Home / Self Care | Payer: Medicaid Other | Source: Ambulatory Visit | Attending: Medical | Admitting: Medical

## 2017-09-23 DIAGNOSIS — IMO0002 Reserved for concepts with insufficient information to code with codable children: Secondary | ICD-10-CM

## 2017-09-23 DIAGNOSIS — Z3A26 26 weeks gestation of pregnancy: Secondary | ICD-10-CM

## 2017-09-23 DIAGNOSIS — O99332 Smoking (tobacco) complicating pregnancy, second trimester: Secondary | ICD-10-CM | POA: Insufficient documentation

## 2017-09-23 DIAGNOSIS — O34219 Maternal care for unspecified type scar from previous cesarean delivery: Secondary | ICD-10-CM | POA: Diagnosis not present

## 2017-09-23 DIAGNOSIS — O99212 Obesity complicating pregnancy, second trimester: Secondary | ICD-10-CM | POA: Diagnosis not present

## 2017-09-23 DIAGNOSIS — O3442 Maternal care for other abnormalities of cervix, second trimester: Secondary | ICD-10-CM | POA: Diagnosis not present

## 2017-09-23 DIAGNOSIS — E669 Obesity, unspecified: Secondary | ICD-10-CM | POA: Diagnosis not present

## 2017-09-23 DIAGNOSIS — F17210 Nicotine dependence, cigarettes, uncomplicated: Secondary | ICD-10-CM | POA: Insufficient documentation

## 2017-09-23 DIAGNOSIS — Z0489 Encounter for examination and observation for other specified reasons: Secondary | ICD-10-CM

## 2017-09-23 DIAGNOSIS — Z362 Encounter for other antenatal screening follow-up: Secondary | ICD-10-CM | POA: Insufficient documentation

## 2017-09-27 ENCOUNTER — Encounter: Payer: Self-pay | Admitting: *Deleted

## 2017-10-07 ENCOUNTER — Other Ambulatory Visit: Payer: Self-pay | Admitting: *Deleted

## 2017-10-07 ENCOUNTER — Other Ambulatory Visit: Payer: Self-pay

## 2017-10-07 DIAGNOSIS — Z3480 Encounter for supervision of other normal pregnancy, unspecified trimester: Secondary | ICD-10-CM

## 2017-10-08 ENCOUNTER — Other Ambulatory Visit: Payer: Medicaid Other

## 2017-10-08 ENCOUNTER — Ambulatory Visit (INDEPENDENT_AMBULATORY_CARE_PROVIDER_SITE_OTHER): Payer: Medicaid Other | Admitting: Obstetrics and Gynecology

## 2017-10-08 VITALS — BP 123/70 | HR 100 | Wt 272.4 lb

## 2017-10-08 DIAGNOSIS — Z23 Encounter for immunization: Secondary | ICD-10-CM

## 2017-10-08 DIAGNOSIS — O34219 Maternal care for unspecified type scar from previous cesarean delivery: Secondary | ICD-10-CM | POA: Insufficient documentation

## 2017-10-08 DIAGNOSIS — B373 Candidiasis of vulva and vagina: Secondary | ICD-10-CM

## 2017-10-08 DIAGNOSIS — Z3480 Encounter for supervision of other normal pregnancy, unspecified trimester: Secondary | ICD-10-CM

## 2017-10-08 DIAGNOSIS — B3731 Acute candidiasis of vulva and vagina: Secondary | ICD-10-CM

## 2017-10-08 DIAGNOSIS — Z3483 Encounter for supervision of other normal pregnancy, third trimester: Secondary | ICD-10-CM

## 2017-10-08 MED ORDER — TERCONAZOLE 0.4 % VA CREA: 1.0000 | TOPICAL_CREAM | Freq: Every day | VAGINAL | 0 refills | Status: AC

## 2017-10-08 NOTE — Patient Instructions (Addendum)
Tdap Vaccine (Tetanus, Diphtheria and Pertussis): What You Need to Know 1. Why get vaccinated? Tetanus, diphtheria and pertussis are very serious diseases. Tdap vaccine can protect us from these diseases. And, Tdap vaccine given to pregnant women can protect newborn babies against pertussis. TETANUS (Lockjaw) is rare in the [place] today. It causes painful muscle tightening and stiffness, usually all over the body.  It can lead to tightening of muscles in the head and neck so you can't open your mouth, swallow, or sometimes even breathe. Tetanus kills about 1 out of 10 people who are infected even after receiving the best medical care.  DIPHTHERIA is also rare in the [place] today. It can cause a thick coating to form in the back of the throat.  It can lead to breathing problems, heart failure, paralysis, and death.  PERTUSSIS (Whooping Cough) causes severe coughing spells, which can cause difficulty breathing, vomiting and disturbed sleep.  It can also lead to weight loss, incontinence, and rib fractures. Up to 2 in 100 adolescents and 5 in 100 adults with pertussis are hospitalized or have complications, which could include pneumonia or death.  These diseases are caused by bacteria. Diphtheria and pertussis are spread from person to person through secretions from coughing or sneezing. Tetanus enters the body through cuts, scratches, or wounds. Before vaccines, as many as 200,000 cases of diphtheria, 200,000 cases of pertussis, and hundreds of cases of tetanus, were reported in the [place] each year. Since vaccination began, reports of cases for tetanus and diphtheria have dropped by about 99% and for pertussis by about 80%. 2. Tdap vaccine Tdap vaccine can protect adolescents and adults from tetanus, diphtheria, and pertussis. One dose of Tdap is routinely given at age 11 or 12. People who did not get Tdap at that age should get it as soon as possible. Tdap is especially  important for healthcare professionals and anyone having close contact with a baby younger than 12 months. Pregnant women should get a dose of Tdap during every pregnancy, to protect the newborn from pertussis. Infants are most at risk for severe, life-threatening complications from pertussis. Another vaccine, called Td, protects against tetanus and diphtheria, but not pertussis. A Td booster should be given every 10 years. Tdap may be given as one of these boosters if you have never gotten Tdap before. Tdap may also be given after a severe cut or burn to prevent tetanus infection. Your doctor or the person giving you the vaccine can give you more information. Tdap may safely be given at the same time as other vaccines. 3. Some people should not get this vaccine  A person who has ever had a life-threatening allergic reaction after a previous dose of any diphtheria, tetanus or pertussis containing vaccine, OR has a severe allergy to any part of this vaccine, should not get Tdap vaccine. Tell the person giving the vaccine about any severe allergies.  Anyone who had coma or long repeated seizures within 7 days after a childhood dose of DTP or DTaP, or a previous dose of Tdap, should not get Tdap, unless a cause other than the vaccine was found. They can still get Td.  Talk to your doctor if you: ? have seizures or another nervous system problem, ? had severe pain or swelling after any vaccine containing diphtheria, tetanus or pertussis, ? ever had a condition called Guillain-Barr Syndrome (GBS), ? aren't feeling well on the day the shot is scheduled. 4. Risks With any medicine, including   vaccines, there is a chance of side effects. These are usually mild and go away on their own. Serious reactions are also possible but are rare. Most people who get Tdap vaccine do not have any problems with it. Mild problems following Tdap: (Did not interfere with activities)  Pain where the shot was given (about  3 in 4 adolescents or 2 in 3 adults)  Redness or swelling where the shot was given (about 1 person in 5)  Mild fever of at least 100.4F (up to about 1 in 25 adolescents or 1 in 100 adults)  Headache (about 3 or 4 people in 10)  Tiredness (about 1 person in 3 or 4)  Nausea, vomiting, diarrhea, stomach ache (up to 1 in 4 adolescents or 1 in 10 adults)  Chills, sore joints (about 1 person in 10)  Body aches (about 1 person in 3 or 4)  Rash, swollen glands (uncommon)  Moderate problems following Tdap: (Interfered with activities, but did not require medical attention)  Pain where the shot was given (up to 1 in 5 or 6)  Redness or swelling where the shot was given (up to about 1 in 16 adolescents or 1 in 12 adults)  Fever over 102F (about 1 in 100 adolescents or 1 in 250 adults)  Headache (about 1 in 7 adolescents or 1 in 10 adults)  Nausea, vomiting, diarrhea, stomach ache (up to 1 or 3 people in 100)  Swelling of the entire arm where the shot was given (up to about 1 in 500).  Severe problems following Tdap: (Unable to perform usual activities; required medical attention)  Swelling, severe pain, bleeding and redness in the arm where the shot was given (rare).  Problems that could happen after any vaccine:  People sometimes faint after a medical procedure, including vaccination. Sitting or lying down for about 15 minutes can help prevent fainting, and injuries caused by a fall. Tell your doctor if you feel dizzy, or have vision changes or ringing in the ears.  Some people get severe pain in the shoulder and have difficulty moving the arm where a shot was given. This happens very rarely.  Any medication can cause a severe allergic reaction. Such reactions from a vaccine are very rare, estimated at fewer than 1 in a million doses, and would happen within a few minutes to a few hours after the vaccination. As with any medicine, there is a very remote chance of a vaccine  causing a serious injury or death. The safety of vaccines is always being monitored. For more information, visit: www.cdc.gov/vaccinesafety/ 5. What if there is a serious problem? What should I look for? Look for anything that concerns you, such as signs of a severe allergic reaction, very high fever, or unusual behavior. Signs of a severe allergic reaction can include hives, swelling of the face and throat, difficulty breathing, a fast heartbeat, dizziness, and weakness. These would usually start a few minutes to a few hours after the vaccination. What should I do?  If you think it is a severe allergic reaction or other emergency that can't wait, call 9-1-1 or get the person to the nearest hospital. Otherwise, call your doctor.  Afterward, the reaction should be reported to the Vaccine Adverse Event Reporting System (VAERS). Your doctor might file this report, or you can do it yourself through the VAERS web site at www.vaers.hhs.gov, or by calling 1-800-822-7967. ? VAERS does not give medical advice. 6. The National Vaccine Injury Compensation Program The National   Vaccine Injury Compensation Program (VICP) is a federal program that was created to compensate people who may have been injured by certain vaccines. Persons who believe they may have been injured by a vaccine can learn about the program and about filing a claim by calling 1-403 380 1636 or visiting the VICP website at SpiritualWord.at. There is a time limit to file a claim for compensation. 7. How can I learn more?  Ask your doctor. He or she can give you the vaccine package insert or suggest other sources of information.  Call your local or state health department.  Contact the Centers for Disease Control and Prevention (CDC): ? Call (604) 374-8560 (1-800-CDC-INFO) or ? Visit CDC's website at PicCapture.uy CDC Tdap Vaccine VIS (07/04/13) This information is not intended to replace advice given to you by your  health care provider. Make sure you discuss any questions you have with your health care provider. Document Released: 10/27/2011 Document Revised: 01/16/2016 Document Reviewed: 01/16/2016 Elsevier Interactive Patient Education  2017 [employer].  Preventing Preterm Birth Preterm birth is when your baby is delivered between 20 weeks and 37 weeks of pregnancy. A full-term pregnancy lasts for at least 37 weeks. Preterm birth can be dangerous for your baby because the last few weeks of pregnancy are an important time for your baby's brain and lungs to grow. Many things can cause a baby to be born early. Sometimes the cause is not known. There are certain factors that make you more likely to experience preterm birth, such as:  Having a previous baby born preterm.  Being pregnant with twins or other multiples.  Having had fertility treatment.  Being overweight or underweight at the start of your pregnancy.  Having any of the following during pregnancy: ? An infection, including a urinary tract infection (UTI) or an STI (sexually transmitted infection). ? High blood pressure. ? Diabetes. ? Vaginal bleeding.  Being age 24 or older.  Being age 30 or younger.  Getting pregnant within 6 months of a previous pregnancy.  Suffering extreme stress or physical or emotional abuse during pregnancy.  Standing for long periods of time during pregnancy, such as working at a job that requires standing.  What are the risks? The most serious risk of preterm birth is that the baby may not survive. This is more likely to happen if a baby is born before 34 weeks. Other risks and complications of preterm birth may include your baby having:  Breathing problems.  Brain damage that affects movement and coordination (cerebral palsy).  Feeding difficulties.  Vision or hearing problems.  Infections or inflammation of the digestive tract (colitis).  Developmental delays.  Learning  disabilities.  Higher risk for diabetes, heart disease, and high blood pressure later in life.  What can I do to lower my risk? Medical care  The most important thing you can do to lower your risk for preterm birth is to get routine medical care during pregnancy (prenatal care). If you have a high risk of preterm birth, you may be referred to a health care provider who specializes in managing high-risk pregnancies (perinatologist). You may be given medicine to help prevent preterm birth. Lifestyle changes Certain lifestyle changes can also lower your risk of preterm birth:  Wait at least 6 months after a pregnancy to become pregnant again.  Try to plan pregnancy for when you are between 27 and 28 years old.  Get to a healthy weight before getting pregnant. If you are overweight, work with your health care provider  to safely lose weight.  Do not use any products that contain nicotine or tobacco, such as cigarettes and e-cigarettes. If you need help quitting, ask your health care provider.  Do not drink alcohol.  Do not use drugs.  Where to find support: For more support, consider:  Talking with your health care provider.  Talking with a therapist or substance abuse counselor, if you need help quitting.  Working with a diet and nutrition specialist (dietitian) or a Systems analyst to maintain a healthy weight.  Joining a support group.  Where to find more information: Learn more about preventing preterm birth from:  Centers for Disease Control and Prevention: http://curry.org/  March of Dimes: marchofdimes.org/complications/premature-babies.aspx  American Pregnancy Association: americanpregnancy.org/labor-and-birth/premature-labor  Contact a health care provider if:  You have any of the following signs of preterm labor before 37 weeks: ? A change or increase in vaginal discharge. ? Fluid leaking from your vagina. ? Pressure  or cramps in your lower abdomen. ? A backache that does not go away or gets worse. ? Regular tightening (contractions) in your lower abdomen. Summary  Preterm birth means having your baby during weeks 20-37 of pregnancy.  Preterm birth may put your baby at risk for physical and mental problems.  Getting good prenatal care can help prevent preterm birth.  You can lower your risk of preterm birth by making certain lifestyle changes, such as not smoking and not using alcohol. This information is not intended to replace advice given to you by your health care provider. Make sure you discuss any questions you have with your health care provider. Document Released: 06/11/2015 Document Revised: 01/04/2016 Document Reviewed: 01/04/2016 Elsevier Interactive Patient Education  2018 [employer].  Fetal Movement Counts Patient Name: ________________________________________________ Patient Due Date: ____________________ What is a fetal movement count? A fetal movement count is the number of times that you feel your baby move during a certain amount of time. This may also be called a fetal kick count. A fetal movement count is recommended for every pregnant woman. You may be asked to start counting fetal movements as early as week 28 of your pregnancy. Pay attention to when your baby is most active. You may notice your baby's sleep and wake cycles. You may also notice things that make your baby move more. You should do a fetal movement count:  When your baby is normally most active.  At the same time each day.  A good time to count movements is while you are resting, after having something to eat and drink. How do I count fetal movements? 1. Find a quiet, comfortable area. Sit, or lie down on your side. 2. Write down the date, the start time and stop time, and the number of movements that you felt between those two times. Take this information with you to your health care visits. 3. For 2 hours,  count kicks, flutters, swishes, rolls, and jabs. You should feel at least 10 movements during 2 hours. 4. You may stop counting after you have felt 10 movements. 5. If you do not feel 10 movements in 2 hours, have something to eat and drink. Then, keep resting and counting for 1 hour. If you feel at least 4 movements during that hour, you may stop counting. Contact a health care provider if:  You feel fewer than 4 movements in 2 hours.  Your baby is not moving like he or she usually does. Date: ____________ Start time: ____________ Stop time: ____________ Movements: ____________ Date: ____________  Start time: ____________ Stop time: ____________ Movements: ____________ Date: ____________ Start time: ____________ Stop time: ____________ Movements: ____________ Date: ____________ Start time: ____________ Stop time: ____________ Movements: ____________ Date: ____________ Start time: ____________ Stop time: ____________ Movements: ____________ Date: ____________ Start time: ____________ Stop time: ____________ Movements: ____________ Date: ____________ Start time: ____________ Stop time: ____________ Movements: ____________ Date: ____________ Start time: ____________ Stop time: ____________ Movements: ____________ Date: ____________ Start time: ____________ Stop time: ____________ Movements: ____________ This information is not intended to replace advice given to you by your health care provider. Make sure you discuss any questions you have with your health care provider. Document Released: 05/27/2006 Document Revised: 12/25/2015 Document Reviewed: 06/06/2015 Elsevier Interactive Patient Education  [employer]2018 Else[employer] Inc.  Third Trimester of Pregnancy The third trimester is from week 29 through week 42, months 7 through 9. This trimester is when your unborn baby (fetus) is growing very fast. At the end of the ninth month, the unborn baby is about 20 inches in length. It weighs about 6-10  pounds. Follow these instructions at home: Avoid all smoking, herbs, and alcohol. Avoid drugs not approved by your doctor. Do not use any tobacco products, including cigarettes, chewing tobacco, and electronic cigarettes. If you need help quitting, ask your doctor. You may get counseling or other support to help you quit. Only take medicine as told by your doctor. Some medicines are safe and some are not during pregnancy. Exercise only as told by your doctor. Stop exercising if you start having cramps. Eat regular, healthy meals. Wear a good support bra if your breasts are tender. Do not use hot tubs, steam rooms, or saunas. Wear your seat belt when driving. Avoid raw meat, uncooked cheese, and liter boxes and soil used by cats. Take your prenatal vitamins. Take 1500-2000 milligrams of calcium daily starting at the 20th week of pregnancy until you deliver your baby. Try taking medicine that helps you poop (stool softener) as needed, and if your doctor approves. Eat more fiber by eating fresh fruit, vegetables, and whole grains. Drink enough fluids to keep your pee (urine) clear or pale yellow. Take warm water baths (sitz baths) to soothe pain or discomfort caused by hemorrhoids. Use hemorrhoid cream if your doctor approves. If you have puffy, bulging veins (varicose veins), wear support hose. Raise (elevate) your feet for 15 minutes, 3-4 times a day. Limit salt in your diet. Avoid heavy lifting, wear low heels, and sit up straight. Rest with your legs raised if you have leg cramps or low back pain. Visit your dentist if you have not gone during your pregnancy. Use a soft toothbrush to brush your teeth. Be gentle when you floss. You can have sex (intercourse) unless your doctor tells you not to. Do not travel far distances unless you must. Only do so with your doctor's approval. Take prenatal classes. Practice driving to the hospital. Pack your hospital bag. Prepare the baby's room. Go to  your doctor visits. Get help if: You are not sure if you are in labor or if your water has broken. You are dizzy. You have mild cramps or pressure in your lower belly (abdominal). You have a nagging pain in your belly area. You continue to feel sick to your stomach (nauseous), throw up (vomit), or have watery poop (diarrhea). You have bad smelling fluid coming from your vagina. You have pain with peeing (urination). Get help right away if: You have a fever. You are leaking fluid from your vagina. You are spotting or  bleeding from your vagina. You have severe belly cramping or pain. You lose or gain weight rapidly. You have trouble catching your breath and have chest pain. You notice sudden or extreme puffiness (swelling) of your face, hands, ankles, feet, or legs. You have not felt the baby move in over an hour. You have severe headaches that do not go away with medicine. You have vision changes. This information is not intended to replace advice given to you by your health care provider. Make sure you discuss any questions you have with your health care provider. Document Released: 07/22/2009 Document Revised: 10/03/2015 Document Reviewed: 06/28/2012 Elsevier Interactive Patient Education  2017 [employer].

## 2017-10-08 NOTE — Progress Notes (Deleted)
   PRENATAL VISIT NOTE  Subjective:  Susan English is a 27 y.o. G2P1001 at 9656w4d being seen today for ongoing prenatal care.  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  Patient reports vaginal irritation d/t abx use after dental procedure.  Contractions: Not present. Vag. Bleeding: None.  Movement: Present. Denies leaking of fluid.   The following portions of the patient's history were reviewed and updated as appropriate: allergies, current medications, past family history, past medical history, past social history, past surgical history and problem list. Problem list updated.  Objective:   Vitals:   10/08/17 0844  BP: 123/70  Pulse: 100  Weight: 272 lb 6.4 oz (123.6 kg)    Fetal Status: Fetal Heart Rate (bpm): 150   Movement: Present     General:  Alert, oriented and cooperative. Patient is in no acute distress.  Skin: Skin is warm and dry. No rash noted.   Cardiovascular: Normal heart rate noted  Respiratory: Normal respiratory effort, no problems with respiration noted  Abdomen: Soft, gravid, appropriate for gestational age.  Pain/Pressure: Present     Pelvic: Cervical exam deferred        Extremities: Normal range of motion.  Edema: Trace  Mental Status: Normal mood and affect. Normal behavior. Normal judgment and thought content.   Assessment and Plan:  Pregnancy: G2P1001 at 7156w4d  There are no diagnoses linked to this encounter. Preterm labor symptoms and general obstetric precautions including but not limited to vaginal bleeding, contractions, leaking of  fluid and fetal movement were reviewed in detail with the patient. Please refer to After Visit Summary for other counseling recommendations.  No follow-ups on file.  Future Appointments  Date Time Provider Department Center  10/08/2017  9:15 AM Raelyn Moraawson, [facility]unya Lemler, [facility]    Raelyn Moraolitta Shakyia Bosso, [place]

## 2017-10-08 NOTE — Progress Notes (Signed)
   PRENATAL VISIT NOTE  Subjective:  Susan English is a 27 y.o. G2P1001 at 5034w4d being seen today for ongoing prenatal care.  She is currently monitored for the following issues for this high-risk pregnancy and has HSIL (high grade squamous intraepithelial lesion) on Pap smear of cervix; Supervision of other normal pregnancy, antepartum; Previous cesarean delivery, antepartum condition or complication; Status post cryotherapy of skin lesion; Smoker; Obesity complicating pregnancy in second trimester; Tobacco smoking complicating pregnancy in second trimester; and Previous cesarean delivery affecting pregnancy, antepartum on their problem list.  Patient reports vaginal irritation after taking abx after a dental procedure. She is requesting "something for a yeast infection".  Contractions: Not present. Vag. Bleeding: None.  Movement: Present. Denies leaking of fluid.   The following portions of the patient's history were reviewed and updated as appropriate: allergies, current medications, past family history, past medical history, past social history, past surgical history and problem list. Problem list updated.  Objective:   Vitals:   10/08/17 0844  BP: 123/70  Pulse: 100  Weight: 272 lb 6.4 oz (123.6 kg)    Fetal Status: Fetal Heart Rate (bpm): 150 Fundal Height: 31 cm Movement: Present     General:  Alert, oriented and cooperative. Patient is in no acute distress.  Skin: Skin is warm and dry. No rash noted.   Cardiovascular: Normal heart rate noted  Respiratory: Normal respiratory effort, no problems with respiration noted  Abdomen: Soft, gravid, appropriate for gestational age.  Pain/Pressure: Present     Pelvic: Cervical exam deferred        Extremities: Normal range of motion.  Edema: Trace  Mental Status: Normal mood and affect. Normal behavior. Normal judgment and thought content.   Assessment and Plan:  Pregnancy: G2P1001 at 8034w4d  Supervision of other normal pregnancy,  antepartum  - Tdap vaccine  - Third trimester labs - Growth U/S d/t h/o C/S delivery for "big baby" & possible FTD and current FH  Previous cesarean delivery affecting pregnancy, antepartum - Planning TOLAC - TOLAC informed consent signed  Candida vaginitis - Rx for Terazol cream 0.4% vaginally x 5 days sent to pharmacy on file  Preterm labor symptoms and general obstetric precautions including but not limited to vaginal bleeding, contractions, leaking of fluid and fetal movement were reviewed in detail with the patient. Please refer to After Visit Summary for other counseling recommendations.  Return in about 2 weeks (around 10/22/2017) for Return OB visit.  Future Appointments  Date Time Provider Department Center  10/22/2017  2:55 PM Currie ParisBurleson, Terri L, NP [facility]  11/05/2017 10:30 AM [facility] US 5 [facility]    Raelyn Moraolitta Aarthi Uyeno, CNM

## 2017-10-09 LAB — CBC
Hematocrit: 32 % — ABNORMAL LOW (ref 34.0–46.6)
Hemoglobin: 11 g/dL — ABNORMAL LOW (ref 11.1–15.9)
MCH: 29.4 pg (ref 26.6–33.0)
MCHC: 34.4 g/dL (ref 31.5–35.7)
MCV: 86 fL (ref 79–97)
PLATELETS: 248 10*3/uL (ref 150–450)
RBC: 3.74 x10E6/uL — ABNORMAL LOW (ref 3.77–5.28)
RDW: 13.3 % (ref 12.3–15.4)
WBC: 12.7 10*3/uL — ABNORMAL HIGH (ref 3.4–10.8)

## 2017-10-09 LAB — GLUCOSE TOLERANCE, 2 HOURS W/ 1HR
GLUCOSE, 1 HOUR: 173 mg/dL (ref 65–179)
Glucose, 2 hour: 56 mg/dL — ABNORMAL LOW (ref 65–152)
Glucose, Fasting: 94 mg/dL — ABNORMAL HIGH (ref 65–91)

## 2017-10-09 LAB — HIV ANTIBODY (ROUTINE TESTING W REFLEX): HIV Screen 4th Generation wRfx: NONREACTIVE

## 2017-10-09 LAB — RPR: RPR: NONREACTIVE

## 2017-10-14 ENCOUNTER — Encounter (HOSPITAL_COMMUNITY): Payer: Self-pay

## 2017-10-14 ENCOUNTER — Inpatient Hospital Stay (HOSPITAL_COMMUNITY)
Admission: AD | Admit: 2017-10-14 | Discharge: 2017-10-14 | Disposition: A | Discharge status: Home / Self Care | Payer: Medicaid Other | Source: Ambulatory Visit | Attending: Obstetrics and Gynecology | Admitting: Obstetrics and Gynecology

## 2017-10-14 ENCOUNTER — Inpatient Hospital Stay (HOSPITAL_COMMUNITY): Payer: Medicaid Other

## 2017-10-14 DIAGNOSIS — S62615A Displaced fracture of proximal phalanx of left ring finger, initial encounter for closed fracture: Secondary | ICD-10-CM | POA: Diagnosis not present

## 2017-10-14 DIAGNOSIS — Z833 Family history of diabetes mellitus: Secondary | ICD-10-CM | POA: Insufficient documentation

## 2017-10-14 DIAGNOSIS — S62645A Nondisplaced fracture of proximal phalanx of left ring finger, initial encounter for closed fracture: Secondary | ICD-10-CM

## 2017-10-14 DIAGNOSIS — Z91040 Latex allergy status: Secondary | ICD-10-CM | POA: Diagnosis not present

## 2017-10-14 DIAGNOSIS — W109XXA Fall (on) (from) unspecified stairs and steps, initial encounter: Secondary | ICD-10-CM | POA: Insufficient documentation

## 2017-10-14 DIAGNOSIS — Z8249 Family history of ischemic heart disease and other diseases of the circulatory system: Secondary | ICD-10-CM | POA: Insufficient documentation

## 2017-10-14 DIAGNOSIS — Z3A29 29 weeks gestation of pregnancy: Secondary | ICD-10-CM | POA: Diagnosis not present

## 2017-10-14 DIAGNOSIS — O9A213 Injury, poisoning and certain other consequences of external causes complicating pregnancy, third trimester: Secondary | ICD-10-CM | POA: Insufficient documentation

## 2017-10-14 DIAGNOSIS — O99333 Smoking (tobacco) complicating pregnancy, third trimester: Secondary | ICD-10-CM | POA: Diagnosis not present

## 2017-10-14 DIAGNOSIS — O34219 Maternal care for unspecified type scar from previous cesarean delivery: Secondary | ICD-10-CM | POA: Insufficient documentation

## 2017-10-14 DIAGNOSIS — R102 Pelvic and perineal pain: Secondary | ICD-10-CM | POA: Diagnosis not present

## 2017-10-14 DIAGNOSIS — R1030 Lower abdominal pain, unspecified: Secondary | ICD-10-CM | POA: Diagnosis not present

## 2017-10-14 DIAGNOSIS — M79646 Pain in unspecified finger(s): Secondary | ICD-10-CM | POA: Diagnosis not present

## 2017-10-14 DIAGNOSIS — M79645 Pain in left finger(s): Secondary | ICD-10-CM | POA: Diagnosis present

## 2017-10-14 DIAGNOSIS — W108XXA Fall (on) (from) other stairs and steps, initial encounter: Secondary | ICD-10-CM | POA: Diagnosis not present

## 2017-10-14 DIAGNOSIS — O9989 Other specified diseases and conditions complicating pregnancy, childbirth and the puerperium: Secondary | ICD-10-CM | POA: Diagnosis not present

## 2017-10-14 DIAGNOSIS — F17210 Nicotine dependence, cigarettes, uncomplicated: Secondary | ICD-10-CM | POA: Insufficient documentation

## 2017-10-14 DIAGNOSIS — O26893 Other specified pregnancy related conditions, third trimester: Secondary | ICD-10-CM | POA: Diagnosis not present

## 2017-10-14 MED ORDER — CYCLOBENZAPRINE HCL 5 MG PO TABS: 5.0000 mg | ORAL_TABLET | Freq: Three times a day (TID) | ORAL | 0 refills | Status: DC | PRN

## 2017-10-14 NOTE — MAU Provider Note (Addendum)
Chief Complaint:  Fall   First Provider Initiated Contact with Patient 10/14/17 0326     HPI: Susan English is a 27 y.o. G2P1001 at 21w3dwho presents to maternity admissions reporting falling while going up some stairs at 2300hrs.  States hit the front of her thighs and her left hand, hurting her 4th finger.  States immediately had sharp vaginal pains which hurt more with walking. Took a vicodin for pain.. She reports good fetal movement, denies LOF, vaginal bleeding, vaginal itching/burning, urinary symptoms, h/a, dizziness, n/v, diarrhea, constipation or fever/chills.    Fall  The accident occurred 3 to 6 hours ago. The fall occurred while walking (going up stairs). She landed on carpet. There was no blood loss. Point of impact: front of thighs, left hand. The pain is present in the right upper leg and left upper leg (left ring finger). The pain is severe (described by pt). The symptoms are aggravated by ambulation and movement. Pertinent negatives include no abdominal pain, fever, headaches, loss of consciousness, nausea, numbness, tingling, visual change or vomiting. Treatments tried: Vicodin. The treatment provided no relief.    RN Note: Fell at 2300-having pelvic pain, leg pain, and finger pain since then.  No LOF.  Took Vicodin at 0000 that she had left over from previous dental pain.  Hasn't felt baby move since the fall.  Did not directly hit her abdomen.    Past Medical History: Past Medical History:  Diagnosis Date  . Headache(784.0)   . 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 Obesity     Past obstetric history: OB History  Gravida Para Term Preterm AB Living  2 1 1     1   SAB TAB Ectopic Multiple Live Births          1    # Outcome Date GA Lbr Len/2nd Weight Sex Delivery Anes PTL Lv  2 Current           1 Term 2011 [redacted]w[redacted]d  8 lb 2 oz (3.685 kg) M CS-LTranv EPI N LIV    Past Surgical History: Past Surgical History:  Procedure  Laterality Date  . CESAREAN SECTION    . ORIF ANKLE FRACTURE Right 12/22/2012   Procedure: OPEN REDUCTION INTERNAL FIXATION (ORIF) ANKLE FRACTURE;  Surgeon: Mable Paris, MD;  Location: [facility];  Service: Orthopedics;  Laterality: Right;  . sweat gland      Family History: Family History  Problem Relation Age of Onset  . Hypertension Mother   . Diabetes Mother   . Diabetes Maternal Grandmother   . Breast cancer Paternal Grandmother     Social History: Social History   Tobacco Use  . Smoking status: Current Every Day Smoker    Packs/day: 0.50  . Smokeless tobacco: Never Used  Substance Use Topics  . Alcohol use: No    Frequency: Never    Comment: occ  . Drug use: No    Allergies:  Allergies  Allergen Reactions  . Latex Hives    Meds:  Medications Prior to Admission  Medication Sig Dispense Refill Last Dose  . HYDROcodone-acetaminophen (NORCO/VICODIN) 5-325 MG tablet Take 1 tablet by mouth every 4 (four) hours as needed for moderate pain.   10/14/2017 at 0000  . prenatal vitamin w/FE, FA (PRENATAL 1 + 1) 27-1 MG TABS tablet Take 1 tablet by mouth daily at 12 noon. 30 each 9 10/13/2017 at 0800  . Acetaminophen-Codeine (TYLENOL WITH CODEINE #3  PO) Take 1 tablet by mouth every 6 (six) hours as needed.   Taking  . penicillin v potassium (VEETID) 500 MG tablet Take 500 mg by mouth 4 (four) times daily.   Taking  . terconazole (TERAZOL 7) 0.4 % vaginal cream Place 1 applicator vaginally at bedtime for 7 days. 45 g 0     I have reviewed patient's Past Medical Hx, Surgical Hx, Family Hx, Social Hx, medications and allergies.   ROS:  Review of Systems  Constitutional: Negative for fever.  Gastrointestinal: Negative for abdominal pain, nausea and vomiting.  Neurological: Negative for tingling, loss of consciousness, numbness and headaches.   Other systems negative  Physical Exam   Patient Vitals for the past 24 hrs:  BP Temp Pulse Resp SpO2  Height Weight  10/14/17 0313 112/67 97.7 F (36.5 C) (!) 102 20 100 % 5\' 8"  (1.727 m) -  10/14/17 0306 - - - - - - 275 lb (124.7 kg)   Constitutional: Well-developed, well-nourished female in no acute distress.  Cardiovascular: normal rate and rhythm Respiratory: normal effort, clear to auscultation bilaterally GI: Abd soft, non-tender, gravid appropriate for gestational age.   No rebound or guarding. MS: Extremities nontender (but has a lot of pain when moving legs, in groin areas), no edema, normal ROM, except for left ring finger which has mild bruising at PIP joint, tender to palpation Neurologic: Alert and oriented x 4.  GU: Neg CVAT.  PELVIC EXAM: Cervix long and closed.    FHT:  Baseline 145 , moderate variability, accelerations present, no decelerations Contractions:  Rare   Labs: O/Positive/-- (01/31 1521) No results found for this or any previous visit (from the past 24 hour(s)).  Imaging:  Dg Finger Ring Left  Result Date: 10/14/2017 CLINICAL DATA:  Fall with proximal phalangeal pain. EXAM: LEFT RING FINGER 2+V COMPARISON:  None. FINDINGS: There is a minimally displaced intra-articular fracture of the base of the proximal phalanx of left ring finger. No dislocation. IMPRESSION: Minimally displaced, intra-articular fracture of the base of the left ring finger proximal phalanx. Electronically Signed   By: Deatra RobinsonKevin  Herman M.D.   On: 10/14/2017 04:57      MAU Course/MDM: I have ordered xray of left ring finger and reviewed results.  NST reviewed, reassuring with no contractions.   Treatments in MAU included EFM and  Splinting/buddy-taping of left ring finger.    Assessment: Single IUP at 6193w3d S/P fall up stairs Left ring finger minimally displaced fracture at Proximal end of phalanx Groin and vaginal pain No contractions or other evidence for abruption.  Plan: Discharge home Rx Flexeril for groin pull Keep finger splinted until seen by orthopedics, ice  periodically Message sent to pool for referral to Ortho Preterm Labor precautions and fetal kick counts Follow up in Office for prenatal visits and recheck of status  Encouraged to return here or to other Urgent Care/ED if she develops worsening of symptoms, increase in pain, fever, or other concerning symptoms.   Pt stable at time of discharge.  Wynelle BourgeoisMarie Carlei Huang CNM, MSN Certified Nurse-Midwife 10/14/2017 3:27 AM

## 2017-10-14 NOTE — Discharge Instructions (Signed)
Fall Prevention in the Home Falls can cause injuries. They can happen to people of all ages. There are many things you can do to make your home safe and to help prevent falls. What can I do on the outside of my home?  Regularly fix the edges of walkways and driveways and fix any cracks.  Remove anything that might make you trip as you walk through a door, such as a raised step or threshold.  Trim any bushes or trees on the path to your home.  Use bright outdoor lighting.  Clear any walking paths of anything that might make someone trip, such as rocks or tools.  Regularly check to see if handrails are loose or broken. Make sure that both sides of any steps have handrails.  Any raised decks and porches should have guardrails on the edges.  Have any leaves, snow, or ice cleared regularly.  Use sand or salt on walking paths during winter.  Clean up any spills in your garage right away. This includes oil or grease spills. What can I do in the bathroom?  Use night lights.  Install grab bars by the toilet and in the tub and shower. Do not use towel bars as grab bars.  Use non-skid mats or decals in the tub or shower.  If you need to sit down in the shower, use a plastic, non-slip stool.  Keep the floor dry. Clean up any water that spills on the floor as soon as it happens.  Remove soap buildup in the tub or shower regularly.  Attach bath mats securely with double-sided non-slip rug tape.  Do not have throw rugs and other things on the floor that can make you trip. What can I do in the bedroom?  Use night lights.  Make sure that you have a light by your bed that is easy to reach.  Do not use any sheets or blankets that are too big for your bed. They should not hang down onto the floor.  Have a firm chair that has side arms. You can use this for support while you get dressed.  Do not have throw rugs and other things on the floor that can make you trip. What can I do in the  kitchen?  Clean up any spills right away.  Avoid walking on wet floors.  Keep items that you use a lot in easy-to-reach places.  If you need to reach something above you, use a strong step stool that has a grab bar.  Keep electrical cords out of the way.  Do not use floor polish or wax that makes floors slippery. If you must use wax, use non-skid floor wax.  Do not have throw rugs and other things on the floor that can make you trip. What can I do with my stairs?  Do not leave any items on the stairs.  Make sure that there are handrails on both sides of the stairs and use them. Fix handrails that are broken or loose. Make sure that handrails are as long as the stairways.  Check any carpeting to make sure that it is firmly attached to the stairs. Fix any carpet that is loose or worn.  Avoid having throw rugs at the top or bottom of the stairs. If you do have throw rugs, attach them to the floor with carpet tape.  Make sure that you have a light switch at the top of the stairs and the bottom of the stairs. If you do  not have them, ask someone to add them for you. What else can I do to help prevent falls?  Wear shoes that: ? Do not have high heels. ? Have rubber bottoms. ? Are comfortable and fit you well. ? Are closed at the toe. Do not wear sandals.  If you use a stepladder: ? Make sure that it is fully opened. Do not climb a closed stepladder. ? Make sure that both sides of the stepladder are locked into place. ? Ask someone to hold it for you, if possible.  Clearly mark and make sure that you can see: ? Any grab bars or handrails. ? First and last steps. ? Where the edge of each step is.  Use tools that help you move around (mobility aids) if they are needed. These include: ? Canes. ? Walkers. ? Scooters. ? Crutches.  Turn on the lights when you go into a dark area. Replace any light bulbs as soon as they burn out.  Set up your furniture so you have a clear path.  Avoid moving your furniture around.  If any of your floors are uneven, fix them.  If there are any pets around you, be aware of where they are.  Review your medicines with your doctor. Some medicines can make you feel dizzy. This can increase your chance of falling. Ask your doctor what other things that you can do to help prevent falls. This information is not intended to replace advice given to you by your health care provider. Make sure you discuss any questions you have with your health care provider. Document Released: 02/21/2009 Document Revised: 10/03/2015 Document Reviewed: 06/01/2014 Elsevier Interactive Patient Education  2018 [employer] Finger Fracture A finger fracture is a break in any of the bones of the fingers. What are the causes? The main cause of finger fractures is traumatic injury, such as from:  An injury while playing sports.  A workplace injury.  A fall.  What increases the risk? Activities that can increase your risk of finger fractures include:  Sports.  Workplace activities that involve machinery.  A condition called osteoporosis, which can make your bones less dense and cause them to fracture more easily.  What are the signs or symptoms? The main symptoms of a broken finger are pain, bruising, and swelling shortly after the injury. Other symptoms include:  Bruising of your finger.  Stiffness of your finger.  Exposed bones (compound or open fracture) if the fracture is severe.  How is this diagnosed? This condition is diagnosed based on a physical exam, your medical history, and your symptoms. An X-ray will also be done. How is this treated? Treatment for this condition depends on the severity of the fracture. If the bones are still in place, the finger may be splinted to keep the finger still while it heals (immobilization). If the bones are out of place, they will need to be put back into place. This may be done without surgery, or surgery  may be needed. You may also be given exercises to do to regain strength and flexibility (physical therapy). Follow these instructions at home: If you have a splint:  Wear the splint as told by your health care provider. Remove it only as told by your health care provider.  Loosen the splint if your fingers tingle, become numb, or turn cold and blue.  Keep the splint clean.  If the splint is not waterproof: ? Do not let it get wet. ? Cover it with a  watertight covering when you take a bath or a shower. Managing pain, stiffness, and swelling  If directed, put ice on the injured area: ? If you have a removable splint, remove it as told by your health care provider. ? Put ice in a plastic bag. ? Place a towel between your skin and the bag. ? Leave the ice on for 20 minutes, 2-3 times a day.  Move your fingers often to avoid stiffness and to lessen swelling.  Raise (elevate) the injured area above the level of your heart while you are sitting or lying down. Driving  Do not drive or use heavy machinery while taking prescription pain medicine.  Ask your health care provider when it is safe to drive if you have a splint. General instructions  Do not put pressure on any part of the splint until it is fully hardened. This may take several hours.  Do not use any products that contain nicotine or tobacco, such as cigarettes and e-cigarettes. These can delay bone healing. If you need help quitting, ask your health care provider.  Take over-the-counter and prescription medicines only as told by your health care provider.  Do exercises as told by your health care provider.  Keep all follow-up visits as told by your health care provider. This is important. Contact a health care provider if:  Your pain or swelling gets worse even with treatment.  You have trouble moving your finger. Get help right away if:  Your finger becomes numb or blue. Summary  A finger fracture is a break in  any of the bones of the fingers.  Traumatic injury is the main cause of finger fractures.  Treatment for this condition depends on the severity of the fracture. This information is not intended to replace advice given to you by your health care provider. Make sure you discuss any questions you have with your health care provider. Document Released: 08/09/2000 Document Revised: 03/17/2016 Document Reviewed: 03/17/2016 Elsevier Interactive Patient Education  [employer]2018 [employer]

## 2017-10-15 ENCOUNTER — Encounter: Payer: Self-pay | Admitting: *Deleted

## 2017-10-18 ENCOUNTER — Telehealth: Payer: Self-pay

## 2017-10-18 DIAGNOSIS — S62605A Fracture of unspecified phalanx of left ring finger, initial encounter for closed fracture: Secondary | ICD-10-CM

## 2017-10-18 NOTE — Telephone Encounter (Signed)
Patient scheduled with [facility] 10/22/2017 arrive at 1015am.

## 2017-10-18 NOTE — Telephone Encounter (Signed)
-----   Message from Aviva SignsMarie L Williams, CNM sent at 10/14/2017  5:20 AM EDT ----- Regarding: referral to ortho for fracture Saw her after she fell up stairs Pulled groin muscles Fractured her left ring finger. (I did xray)  No contractions or problem with baby  I splinted her finger temporarily (we have no supplies)  Needs to see Ortho today if possible  She has been to [facility] before but any group is fine It is minimally displaced, so not sure if they need to reduce it.  Thanks Best Buymarie

## 2017-10-22 ENCOUNTER — Ambulatory Visit (INDEPENDENT_AMBULATORY_CARE_PROVIDER_SITE_OTHER): Payer: Medicaid Other | Admitting: Nurse Practitioner

## 2017-10-22 ENCOUNTER — Ambulatory Visit (INDEPENDENT_AMBULATORY_CARE_PROVIDER_SITE_OTHER): Payer: Medicaid Other | Admitting: Clinical

## 2017-10-22 VITALS — BP 108/74 | HR 98 | Wt 273.1 lb

## 2017-10-22 DIAGNOSIS — O99213 Obesity complicating pregnancy, third trimester: Secondary | ICD-10-CM

## 2017-10-22 DIAGNOSIS — O99333 Smoking (tobacco) complicating pregnancy, third trimester: Secondary | ICD-10-CM

## 2017-10-22 DIAGNOSIS — Z3480 Encounter for supervision of other normal pregnancy, unspecified trimester: Secondary | ICD-10-CM

## 2017-10-22 DIAGNOSIS — F17200 Nicotine dependence, unspecified, uncomplicated: Secondary | ICD-10-CM | POA: Diagnosis not present

## 2017-10-22 DIAGNOSIS — F4322 Adjustment disorder with anxiety: Secondary | ICD-10-CM | POA: Diagnosis not present

## 2017-10-22 DIAGNOSIS — Z3483 Encounter for supervision of other normal pregnancy, third trimester: Secondary | ICD-10-CM

## 2017-10-22 DIAGNOSIS — O34219 Maternal care for unspecified type scar from previous cesarean delivery: Secondary | ICD-10-CM

## 2017-10-22 DIAGNOSIS — O99212 Obesity complicating pregnancy, second trimester: Secondary | ICD-10-CM

## 2017-10-22 DIAGNOSIS — O99332 Smoking (tobacco) complicating pregnancy, second trimester: Secondary | ICD-10-CM

## 2017-10-22 DIAGNOSIS — F419 Anxiety disorder, unspecified: Secondary | ICD-10-CM

## 2017-10-22 MED ORDER — HYDROXYZINE HCL 10 MG PO TABS: 10.0000 mg | ORAL_TABLET | Freq: Three times a day (TID) | ORAL | 2 refills | Status: DC | PRN

## 2017-10-22 NOTE — Patient Instructions (Addendum)
1-800-QUIT-NOW  Living With Anxiety After being diagnosed with an anxiety disorder, you may be relieved to know why you have felt or behaved a certain way. It is natural to also feel overwhelmed about the treatment ahead and what it will mean for your life. With care and support, you can manage this condition and recover from it. How to cope with anxiety Dealing with stress Stress is your body's reaction to life changes and events, both good and bad. Stress can last just a few hours or it can be ongoing. Stress can play a major role in anxiety, so it is important to learn both how to cope with stress and how to think about it differently. Talk with your health care provider or a counselor to learn more about stress reduction. He or she may suggest some stress reduction techniques, such as:  Music therapy. This can include creating or listening to music that you enjoy and that inspires you.  Mindfulness-based meditation. This involves being aware of your normal breaths, rather than trying to control your breathing. It can be done while sitting or walking.  Centering prayer. This is a kind of meditation that involves focusing on a word, phrase, or sacred image that is meaningful to you and that brings you peace.  Deep breathing. To do this, expand your stomach and inhale slowly through your nose. Hold your breath for 3-5 seconds. Then exhale slowly, allowing your stomach muscles to relax.  Self-talk. This is a skill where you identify thought patterns that lead to anxiety reactions and correct those thoughts.  Muscle relaxation. This involves tensing muscles then relaxing them.  Choose a stress reduction technique that fits your lifestyle and personality. Stress reduction techniques take time and practice. Set aside 5-15 minutes a day to do them. Therapists can offer training in these techniques. The training may be covered by some insurance plans. Other things you can do to manage stress  include:  Keeping a stress diary. This can help you learn what triggers your stress and ways to control your response.  Thinking about how you respond to certain situations. You may not be able to control everything, but you can control your reaction.  Making time for activities that help you relax, and not feeling guilty about spending your time in this way.  Therapy combined with coping and stress-reduction skills provides the best chance for successful treatment. Medicines Medicines can help ease symptoms. Medicines for anxiety include:  Anti-anxiety drugs.  Antidepressants.  Beta-blockers.  Medicines may be used as the main treatment for anxiety disorder, along with therapy, or if other treatments are not working. Medicines should be prescribed by a health care provider. Relationships Relationships can play a big part in helping you recover. Try to spend more time connecting with trusted friends and family members. Consider going to couples counseling, taking family education classes, or going to family therapy. Therapy can help you and others better understand the condition. How to recognize changes in your condition Everyone has a different response to treatment for anxiety. Recovery from anxiety happens when symptoms decrease and stop interfering with your daily activities at home or work. This may mean that you will start to:  Have better concentration and focus.  Sleep better.  Be less irritable.  Have more energy.  Have improved memory.  It is important to recognize when your condition is getting worse. Contact your health care provider if your symptoms interfere with home or work and you do not feel like  your condition is improving. Where to find help and support: You can get help and support from these sources:  Self-help groups.  Online and [employer] organizations.  A trusted spiritual leader.  Couples counseling.  Family education classes.  Family  therapy.  Follow these instructions at home:  Eat a healthy diet that includes plenty of vegetables, fruits, whole grains, low-fat dairy products, and lean protein. Do not eat a lot of foods that are high in solid fats, added sugars, or salt.  Exercise. Most adults should do the following: ? Exercise for at least 150 minutes each week. The exercise should increase your heart rate and make you sweat (moderate-intensity exercise). ? Strengthening exercises at least twice a week.  Cut down on caffeine, tobacco, alcohol, and other potentially harmful substances.  Get the right amount and quality of sleep. Most adults need 7-9 hours of sleep each night.  Make choices that simplify your life.  Take over-the-counter and prescription medicines only as told by your health care provider.  Avoid caffeine, alcohol, and certain over-the-counter cold medicines. These may make you feel worse. Ask your pharmacist which medicines to avoid.  Keep all follow-up visits as told by your health care provider. This is important. Questions to ask your health care provider  Would I benefit from therapy?  How often should I follow up with a health care provider?  How long do I need to take medicine?  Are there any long-term side effects of my medicine?  Are there any alternatives to taking medicine? Contact a health care provider if:  You have a hard time staying focused or finishing daily tasks.  You spend many hours a day feeling worried about everyday life.  You become exhausted by worry.  You start to have headaches, feel tense, or have nausea.  You urinate more than normal.  You have diarrhea. Get help right away if:  You have a racing heart and shortness of breath.  You have thoughts of hurting yourself or others. If you ever feel like you may hurt yourself or others, or have thoughts about taking your own life, get help right away. You can go to your nearest emergency department or  call:  Your local emergency services (911 in the [place]  A suicide crisis helpline, such as the National Suicide Prevention Lifeline at 367-462-01151-(250)513-3697. This is open 24-hours a day.  Summary  Taking steps to deal with stress can help calm you.  Medicines cannot cure anxiety disorders, but they can help ease symptoms.  Family, friends, and partners can play a big part in helping you recover from an anxiety disorder. This information is not intended to replace advice given to you by your health care provider. Make sure you discuss any questions you have with your health care provider. Document Released: 04/21/2016 Document Revised: 04/21/2016 Document Reviewed: 04/21/2016 Elsevier Interactive Patient Education  2018 [employer]   BENEFITS OF BREASTFEEDING Many women wonder if they should breastfeed. Research shows that breast milk contains the perfect balance of vitamins, protein and fat that your baby needs to grow. It also contains antibodies that help your baby's immune system to fight off viruses and bacteria and can reduce the risk of sudden infant death syndrome (SIDS). In addition, the colostrum (a fluid secreted from the breast in the first few days after delivery) helps your newborn's digestive system to grow and function well. Breast milk is easier to digest than formula. Also, if your baby is born preterm, breast milk can  help to reduce both short- and long-term health problems. BENEFITS OF BREASTFEEDING FOR MOM . Breastfeeding causes a hormone to be released that helps the uterus to contract and return to its normal size more quickly. . It aids in postpartum weight loss, reduces risk of breast and ovarian cancer, heart disease and rheumatoid arthritis. . It decreases the amount of bleeding after the baby is born. benefits of breastfeeding for baby . Provides comfort and nutrition . Protects baby against - Obesity - Diabetes - Asthma - Childhood cancers - Heart disease -  Ear infections - Diarrhea - Pneumonia - Stomach problems - Serious allergies - Skin rashes . Promotes growth and development . Reduces the risk of baby having Sudden Infant Death Syndrome (SIDS) only breastmilk for the first 6 months . Protects baby against diseases/allergies . It's the perfect amount for tiny bellies . It restores baby's energy . Provides the best nutrition for baby . Giving water or formula can make baby more likely to get sick, decrease Mom's milk supply, make baby less content with breastfeeding Skin to Skin After delivery, the staff will place your baby on your chest. This helps with the following: . Regulates baby's temperature, breathing, heart rate and blood sugar . Increases Mom's milk supply . Promotes bonding . Keeps baby and Mom calm and decreases baby's crying Rooming In Your baby will stay in your room with you for the entire time you are in the hospital. This helps with the following: . Allows Mom to learn baby's feeding cues - Fluttering eyes - Sucking on tongue or hand - Rooting (opens mouth and turns head) - Nuzzling into the breast - Bringing hand to mouth . Allows breastfeeding on demand (when your baby is ready) . Helps baby to be calm and content . Ensures a good milk supply . Prevents complications with breastfeeding . Allows parents to learn to care for baby . Allows you to request assistance with breastfeeding Importance of a good latch . Increases milk transfer to baby - baby gets enough milk . Ensures you have enough milk for your baby . Decreases nipple soreness . Don't use pacifiers and bottles - these cause baby to suck differently than breastfeeding . Promotes continuation of breastfeeding Risks of Formula Supplementation with Breastfeeding Giving your infant formula in addition to your breast-milk EXCEPT when medically necessary can lead to: Marland Kitchen Decreases your milk supply  . Loss of confidence in yourself for providing baby's  nutrition  . Engorgement and possibly mastitis  . Asthma & allergies in the baby BREASTFEEDING FAQS How long should I breastfeed my baby? It is recommended that you provide your baby with breast milk only for the first 6 months and then continue for the first year and longer as desired. During the first few weeks after birth, your baby will need to feed 8-12 times every 24 hours, or every 2-3 hours. They will likely feed for 15-30 minutes. How can I help my baby begin breastfeeding? Babies are born with an instinct to breastfeed. A healthy baby can begin breastfeeding right away without specific help. At the hospital, a nurse (or lactation consultant) will help you begin the process and will give you tips on good positioning. It may be helpful to take a breastfeeding class before you deliver in order to know what to expect. How can I help my baby latch on? In order to assist your baby in latching-on, cup your breast in your hand and stroke your baby's lower lip with your  nipple to stimulate your baby's rooting reflex. Your baby will look like he or she is yawning, at which point you should bring the baby towards your breast, while aiming the nipple at the roof of his or her mouth. Remember to bring the baby towards you and not your breast towards the baby. How can I tell if my baby is latched-on? Your baby will have all of your nipple and part of the dark area around the nipple in his or her mouth and your baby's nose will be touching your breast. You should see or hear the baby swallowing. If the baby is not latched-on properly, start the process over. To remove the suction, insert a clean finger between your breast and the baby's mouth. Should I switch breasts during feeding? After feeding on one side, switch the baby to your other breast. If he or she does not continue feeding - that is OK. Your baby will not necessarily need to feed from both breasts in a single feeding. On the next feeding, start  with the other breast for efficiency and comfort. How can I tell if my baby is hungry? When your baby is hungry, they will nuzzle against your breast, make sucking noises and tongue motions and may put their hands near their mouth. Crying is a late sign of hunger, so you should not wait until this point. When they have received enough milk, they will unlatch from the breast. Is it okay to use a pacifier? Until your baby gets the hang of breastfeeding, experts recommend limiting pacifier usage. If you have questions about this, please contact your pediatrician. What can I do to ensure proper nutrition while breastfeeding? . Make sure that you support your own health and your baby's by eating a healthy, well-balanced diet . Your provider may recommend that you continue to take your prenatal vitamin . Drink plenty of fluids. It is a good rule to drink one glass of water before or after feeding . Alcohol will remain in the breast milk for as long as it will remain in the blood stream. If you choose to have a drink, it is recommended that you wait at least 2 hours before feeding . Moderate amounts of caffeine are OK . Some over-the-counter or prescription medications are not recommended during breastfeeding. Check with your provider if you have questions What types of birth control methods are safe while breastfeeding? Progestin-only methods, including a daily pill, an IUD, the implant and the injection are safe while breastfeeding. Methods that contain estrogen (such as combination birth control pills, the vaginal ring and the patch) should not be used during the first month of breastfeeding as these can decrease your milk supply.

## 2017-10-22 NOTE — Progress Notes (Signed)
    Subjective:  Susan English is a 27 y.o. G2P1001 at 5675w4d being seen today for ongoing prenatal care.  She is currently monitored for the following issues for this low-risk pregnancy and has HSIL (high grade squamous intraepithelial lesion) on Pap smear of cervix; Supervision of other normal pregnancy, antepartum; Previous cesarean delivery, antepartum condition or complication; Status post cryotherapy of skin lesion; Smoker; Obesity complicating pregnancy in second trimester; Tobacco smoking complicating pregnancy in second trimester; and Previous cesarean delivery affecting pregnancy, antepartum on their problem list.  Patient reports anxiety and she is still smoking some.  Contractions: Not present. Vag. Bleeding: None.  Movement: Present. Denies leaking of fluid.   The following portions of the patient's history were reviewed and updated as appropriate: allergies, current medications, past family history, past medical history, past social history, past surgical history and problem list. Problem list updated.  Objective:   Vitals:   10/22/17 1507  BP: 108/74  Pulse: 98  Weight: 273 lb 1.6 oz (123.9 kg)    Fetal Status: Fetal Heart Rate (bpm): 147 Fundal Height: 34 cm Movement: Present     General:  Alert, oriented and cooperative. Patient is in no acute distress.  Skin: Skin is warm and dry. No rash noted.   Cardiovascular: Normal heart rate noted  Respiratory: Normal respiratory effort, no problems with respiration noted  Abdomen: Soft, gravid, appropriate for gestational age. Pain/Pressure: Present     Pelvic:  Cervical exam deferred        Extremities: Normal range of motion.  Edema: None  Mental Status: Normal mood and affect. Normal behavior. Normal judgment and thought content.   Urinalysis:      Assessment and Plan:  Pregnancy: G2P1001 at 1975w4d  1. Supervision of other normal pregnancy, antepartum Has US scheduled 11-05-17  2. Previous cesarean delivery, antepartum  condition or complication Plans TOLAC  3. Obesity complicating pregnancy in second trimester Weight gain has slowed  4. Tobacco smoking complicating pregnancy in second trimester Advised to call 1-800-Quit-Now for smoking cessation assistance.  5. [facility] after her exam Atarax prescribed PRN TID Advised that she will need self care techniques in addition to medication to control anxiety.  Preterm labor symptoms and general obstetric precautions including but not limited to vaginal bleeding, contractions, leaking of fluid and fetal movement were reviewed in detail with the patient. Please refer to After Visit Summary for other counseling recommendations.  Return in about 2 weeks (around 11/05/2017).  Nolene BernheimERRI Lillianah Swartzentruber, RN, MSN, NP-BC Nurse Practitioner, [facility], [facility] 10/22/2017 3:24 PM

## 2017-10-22 NOTE — BH Specialist Note (Signed)
Integrated Behavioral Health Initial Visit  MRN: [id number] Name: Susan English  Number of Integrated Behavioral Health Clinician visits:: 1/6 Session Start time: 3:45  Session End time: 4:08 Total time: 20 minutes  Type of Service: Integrated Behavioral Health- Individual/Family Interpretor:No. Interpretor Name and Language: n/a   Warm Hand Off Completed.       SUBJECTIVE: Susan Evenershley C Hartline is a 27 y.o. female accompanied by n/a Patient was referred by Nolene Bernheimerri Burleson for anxiety and quit smoking Patient reports the following symptoms/concerns: Pt states her primary concerns today are anxiety and motivation to quit smoking; pt open to self-coping strategies and problem-solving today. 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 Reduce symptoms of: anxiety and stress 2. Increase knowledge and/or ability of: self-management skills and stress reduction  3. Demonstrate ability to: Increase healthy adjustment to current life circumstances and Increase motivation to adhere to plan of care  INTERVENTIONS: Interventions utilized: Motivational Interviewing, Mindfulness or Management consultantelaxation Training, Psychoeducation and/or Health Education and Link to [employer]  Standardized Assessments completed: GAD-7 and PHQ 9  ASSESSMENT: Patient currently experiencing Adjustment disorder with mixed anxious and depressed mood and Tobacco use disorder.   Patient may benefit from psychoeducation and brief therapeutic interventions regarding coping with symptoms of  anxiety  .  PLAN: 1. Follow up with behavioral health clinician on : Two weeks 2. Behavioral recommendations:  -Keep track of number of cigarettes smoked in next 24 hours, including time of day -Replace one cigarette/day with a healthy habit of your choice -Call [place]uitline for additional peer support and resources -CALM relaxation breathing exercise every morning -Read educational materials regarding coping with symptoms of anxiety  3. Referral(s): Integrated Behavioral Health Services (In Clinic) 4. "From scale of 1-10, how likely are you to follow plan?": 9  Rae LipsJamie C Taiten Brawn, LCSW  Depression screen [facility] 2[facility] 10/22/2017 07/29/2017 11/04/2016 10/09/2016 05/13/2016  Decreased Interest 2 0 1 1 1   Down, Depressed, Hopeless 1 0 1 0 0  PHQ - 2 Score 3 0 2 1 1   Altered sleeping 2 2 1 1 1   Tired, decreased energy 1 2 1 1 2   Change in appetite 1 1 1 2 1   Feeling bad or failure about yourself  0 0 0 0 0  Trouble concentrating 0 2 1 0 0  Moving slowly or fidgety/restless 3 0 0 0 0  Suicidal thoughts 0 0 0 0 0  PHQ-9 Score 10 7 6 5 5    GAD 7 : Generalized Anxiety Score 10/22/2017 07/29/2017 11/04/2016 10/09/2016  Nervous, Anxious, on Edge 3 0 1 0  Control/stop worrying 3 0 1 0  Worry too much - different things 3 0 1 0  Trouble relaxing 3 2 1  0  Restless 2 0 1 0  Easily annoyed or irritable 0 1 1 1   Afraid - awful might happen 1 0 1 0  Total GAD 7 Score 15 3 7  1

## 2017-11-05 ENCOUNTER — Ambulatory Visit (INDEPENDENT_AMBULATORY_CARE_PROVIDER_SITE_OTHER): Payer: Medicaid Other | Admitting: Nurse Practitioner

## 2017-11-05 ENCOUNTER — Ambulatory Visit (HOSPITAL_COMMUNITY)
Admission: RE | Admit: 2017-11-05 | Discharge: 2017-11-05 | Disposition: A | Discharge status: Home / Self Care | Payer: Medicaid Other | Source: Ambulatory Visit | Attending: Obstetrics and Gynecology | Admitting: Obstetrics and Gynecology

## 2017-11-05 ENCOUNTER — Other Ambulatory Visit: Payer: Self-pay | Admitting: Obstetrics and Gynecology

## 2017-11-05 ENCOUNTER — Ambulatory Visit (INDEPENDENT_AMBULATORY_CARE_PROVIDER_SITE_OTHER): Payer: Medicaid Other | Admitting: Clinical

## 2017-11-05 VITALS — BP 118/66 | HR 107 | Wt 278.6 lb

## 2017-11-05 DIAGNOSIS — Z3A32 32 weeks gestation of pregnancy: Secondary | ICD-10-CM | POA: Diagnosis not present

## 2017-11-05 DIAGNOSIS — Z362 Encounter for other antenatal screening follow-up: Secondary | ICD-10-CM | POA: Diagnosis not present

## 2017-11-05 DIAGNOSIS — Z3480 Encounter for supervision of other normal pregnancy, unspecified trimester: Secondary | ICD-10-CM

## 2017-11-05 DIAGNOSIS — F4322 Adjustment disorder with anxiety: Secondary | ICD-10-CM | POA: Diagnosis not present

## 2017-11-05 DIAGNOSIS — O3443 Maternal care for other abnormalities of cervix, third trimester: Secondary | ICD-10-CM | POA: Diagnosis not present

## 2017-11-05 DIAGNOSIS — O99213 Obesity complicating pregnancy, third trimester: Secondary | ICD-10-CM | POA: Insufficient documentation

## 2017-11-05 DIAGNOSIS — O34219 Maternal care for unspecified type scar from previous cesarean delivery: Secondary | ICD-10-CM | POA: Diagnosis not present

## 2017-11-05 DIAGNOSIS — O99333 Smoking (tobacco) complicating pregnancy, third trimester: Secondary | ICD-10-CM | POA: Diagnosis present

## 2017-11-05 DIAGNOSIS — M25561 Pain in right knee: Secondary | ICD-10-CM | POA: Insufficient documentation

## 2017-11-05 NOTE — BH Specialist Note (Signed)
Integrated Behavioral Health Follow Up Visit  MRN: [id number] Name: Susan English  Number of Integrated Behavioral Health Clinician visits: 2/6 Session Start time: 3:45 Session End time: 4:14 Total time: 30 minutes  Type of Service: Integrated Behavioral Health- Individual/Family Interpretor:No. Interpretor Name and Language: n/a  SUBJECTIVE: Susan Evenershley C Goble is a 27 y.o. female accompanied by n/a Patient was referred by Nolene Bernheimerri Burleson, NP for anxiety. Patient reports the following symptoms/concerns: Pt states she has been using self-coping strategies the past two weeks, along with taking BH medication, which has been helping reduce symptoms of anxiety; reducing her stress level is helping to cut down on tobacco use to less than one pack/day. Pt says she has some anxiety over having a VBAC, as that is her goal.  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  Reduce symptoms of: anxiety  2.  Increase knowledge and/or ability of: coping skills  3.  Demonstrate ability to: Increase healthy adjustment to current life circumstances, Increase adequate support systems for patient/family and Decrease self-medicating behaviors  INTERVENTIONS: Interventions utilized:  Mindfulness or Management consultantelaxation Training and Link to [employer] Resources Standardized Assessments completed: GAD-7 and PHQ 9  ASSESSMENT: Patient currently experiencing Adjustment disorder with anxious mood.   Patient may benefit from continued psychoeducation regarding coping with symptoms of anxiety and stop  smoking.  PLAN: 1. Follow up with behavioral health clinician on : One month 2. Behavioral recommendations:  -Continue practicing CALM daily relaxation breathing exercises every morning -Read through Postpartum Planner within one month; consider filling out to use as a guide for postpartum -Continue taking BH medication, as prescribed by medical provider 3. Referral(s): Integrated Behavioral Health Services (In Clinic) 4. "From scale of 1-10, how likely are you to follow plan?": 9  Rae LipsJamie C Sarinah Doetsch, LCSW  [facility] screen [facility] 11/05/2017 10/22/2017 07/29/2017 11/04/2016 10/09/2016  Decreased Interest 1 2 0 1 1  Down, Depressed, Hopeless 1 1 0 1 0  PHQ - 2 Score 2 3 0 2 1  Altered sleeping 0 2 2 1 1   Tired, decreased energy 2 1 2 1 1   Change in appetite 1 1 1 1 2   Feeling bad or failure about yourself  1 0 0 0 0  Trouble concentrating 0 0 2 1 0  Moving slowly or fidgety/restless 0 3 0 0 0  Suicidal thoughts 0 0 0 0 0  PHQ-9 Score 6 10 7 6 5    GAD 7 : Generalized Anxiety Score 11/05/2017 10/22/2017 07/29/2017 11/04/2016  Nervous, Anxious, on Edge 1 3 0 1  Control/stop worrying 0 3 0 1  Worry too much - different things 1 3 0 1  Trouble relaxing 1 3 2 1   Restless 0 2 0 1  Easily annoyed or irritable 0 0 1 1  Afraid - awful might happen 0 1 0 1  Total GAD 7 Score 3 15 3  7

## 2017-11-05 NOTE — Patient Instructions (Signed)

## 2017-11-05 NOTE — Progress Notes (Signed)
    Subjective:  Susan English is a 27 y.o. G2P1001 at 6935w4d being seen today for ongoing prenatal care.  She is currently monitored for the following issues for this high-risk pregnancy and has HSIL (high grade squamous intraepithelial lesion) on Pap smear of cervix; Supervision of other normal pregnancy, antepartum; Previous cesarean delivery, antepartum condition or complication; Status post cryotherapy of skin lesion; Smoker; Obesity complicating pregnancy in second trimester; Tobacco smoking complicating pregnancy in second trimester; Previous cesarean delivery affecting pregnancy, antepartum; and Acute pain of right knee on their problem list.  Patient reports Swollen right knee for one day - sat on her leg wrong last night - no fall..  Contractions: Not present. Vag. Bleeding: None.  Movement: Present. Denies leaking of fluid.  Has not tried any treatment for her knee.  It feels tight and seems swollen.  Has been limping some, but went to work.  The following portions of the patient's history were reviewed and updated as appropriate: allergies, current medications, past family history, past medical history, past social history, past surgical history and problem list. Problem list updated.  Objective:   Vitals:   11/05/17 1616  BP: 118/66  Pulse: (!) 107  Weight: 278 lb 9.6 oz (126.4 kg)    Fetal Status: Fetal Heart Rate (bpm): 142 Fundal Height: 36 cm Movement: Present     General:  Alert, oriented and cooperative. Patient is in no acute distress.  Skin: Skin is warm and dry. No rash noted.   Cardiovascular: Normal heart rate noted  Respiratory: Normal respiratory effort, no problems with respiration noted  Abdomen: Soft, gravid, appropriate for gestational age. Pain/Pressure: Present     Pelvic:  Cervical exam deferred        Extremities: Normal range of motion.  Edema: None  Mental Status: Normal mood and affect. Normal behavior. Normal judgment and thought content.  Observed  patient ambulating and limp is barely noticeable.  Able to bear weight on her right leg with very little difficulty.   Assessment and Plan:  Pregnancy: G2P1001 at 835w4d  1. Supervision of other normal pregnancy, antepartum Stable at present.  2. Acute pain of right knee Ice to knee tonight for 15 minutes.   Advised if it worsens, she may need to see an orthopedic walk n clinic.  Preterm labor symptoms and general obstetric precautions including but not limited to vaginal bleeding, contractions, leaking of fluid and fetal movement were reviewed in detail with the patient. Please refer to After Visit Summary for other counseling recommendations.  Return in about 2 weeks (around 11/19/2017).  Nolene BernheimERRI Myran Arcia, RN, MSN, NP-BC Nurse Practitioner, [facility], [facility] 11/05/2017 4:42 PM

## 2017-11-22 ENCOUNTER — Encounter: Payer: Self-pay | Admitting: Advanced Practice Midwife

## 2017-11-29 ENCOUNTER — Ambulatory Visit (INDEPENDENT_AMBULATORY_CARE_PROVIDER_SITE_OTHER): Payer: Medicaid Other | Admitting: Advanced Practice Midwife

## 2017-11-29 ENCOUNTER — Other Ambulatory Visit (HOSPITAL_COMMUNITY)
Admission: RE | Admit: 2017-11-29 | Discharge: 2017-11-29 | Disposition: A | Discharge status: Home / Self Care | Payer: Medicaid Other | Source: Ambulatory Visit | Attending: Advanced Practice Midwife | Admitting: Advanced Practice Midwife

## 2017-11-29 ENCOUNTER — Encounter: Payer: Self-pay | Admitting: Advanced Practice Midwife

## 2017-11-29 DIAGNOSIS — O23599 Infection of other part of genital tract in pregnancy, unspecified trimester: Secondary | ICD-10-CM | POA: Diagnosis not present

## 2017-11-29 DIAGNOSIS — O24419 Gestational diabetes mellitus in pregnancy, unspecified control: Secondary | ICD-10-CM | POA: Diagnosis not present

## 2017-11-29 DIAGNOSIS — Z3A00 Weeks of gestation of pregnancy not specified: Secondary | ICD-10-CM | POA: Insufficient documentation

## 2017-11-29 DIAGNOSIS — Z3480 Encounter for supervision of other normal pregnancy, unspecified trimester: Secondary | ICD-10-CM

## 2017-11-29 DIAGNOSIS — B373 Candidiasis of vulva and vagina: Secondary | ICD-10-CM | POA: Insufficient documentation

## 2017-11-29 DIAGNOSIS — Z3483 Encounter for supervision of other normal pregnancy, third trimester: Secondary | ICD-10-CM

## 2017-11-29 HISTORY — DX: Gestational diabetes mellitus in pregnancy, unspecified control: O24.419

## 2017-11-29 LAB — OB RESULTS CONSOLE GC/CHLAMYDIA: Gonorrhea: NEGATIVE

## 2017-11-29 LAB — OB RESULTS CONSOLE GBS: GBS: NEGATIVE

## 2017-11-29 NOTE — Progress Notes (Signed)
   PRENATAL VISIT NOTE  Subjective:  Susan English is a 27 y.o. G2P1001 at 55w0dbeing seen today for ongoing prenatal care.  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  Patient reports vaginal irritation.  Contractions: Not present. Vag. Bleeding: None.  Movement: Present. Denies leaking of fluid.   The following portions of the patient's history were reviewed and updated as appropriate: allergies, current medications, past family history, past medical history, past social history, past surgical history and problem list. Problem list updated.  Objective:   Vitals:   11/29/17 1803  BP: 116/73  Pulse: 96  Weight: 286 lb 11.2 oz (130 kg)    Fetal Status: Fetal Heart Rate (bpm): 137 Fundal Height: 37 cm Movement: Present     General:  Alert, oriented and cooperative. Patient is in no acute distress.  Skin: Skin is warm and dry. No rash noted.   Cardiovascular: Normal heart rate noted  Respiratory: Normal respiratory effort, no problems with respiration noted  Abdomen: Soft, gravid, appropriate for gestational age.  Pain/Pressure: Present     Pelvic: Cervical exam performed Dilation: Closed Effacement (%): Thick Station: -3  Extremities: Normal range of motion.  Edema: Trace  Mental Status: Normal mood and affect. Normal behavior. Normal judgment and thought content.   Assessment and Plan:  Pregnancy: G2P1001 at 31w0d1. Supervision of other normal pregnancy,  antepartum - Culture, beta strep (group b only) - Cervicovaginal ancillary only  2. Gestational diabetes mellitus (GDM), antepartum, gestational diabetes method of control unspecified - fasting elevated on 2 hour GTT, but patient had not been informed that she met criteria for GDM - Will schedule with diabetes educator   Preterm labor symptoms and general obstetric precautions including but not limited to vaginal bleeding, contractions, leaking of fluid and fetal movement were reviewed in detail with the patient. Please refer to After Visit Summary for other counseling recommendations.  Return in about 1 week (around 12/06/2017) for high risk .  No future appointments.  HeMarcille BuffyCNM

## 2017-11-29 NOTE — Progress Notes (Signed)
Some vaginal irritation and "itchy down there" but no vag d/c

## 2017-11-30 ENCOUNTER — Encounter: Payer: Medicaid Other | Attending: Obstetrics and Gynecology | Admitting: *Deleted

## 2017-11-30 ENCOUNTER — Ambulatory Visit: Payer: Medicaid Other | Admitting: *Deleted

## 2017-11-30 DIAGNOSIS — O24419 Gestational diabetes mellitus in pregnancy, unspecified control: Secondary | ICD-10-CM

## 2017-11-30 DIAGNOSIS — Z713 Dietary counseling and surveillance: Secondary | ICD-10-CM | POA: Insufficient documentation

## 2017-11-30 MED ORDER — ACCU-CHEK FASTCLIX LANCETS MISC: 1.0000 | Freq: Four times a day (QID) | 2 refills | Status: DC

## 2017-11-30 MED ORDER — GLUCOSE BLOOD VI STRP: ORAL_STRIP | 12 refills | Status: DC

## 2017-11-30 MED ORDER — ACCU-CHEK GUIDE W/DEVICE KIT: 1.0000 | PACK | Freq: Once | 0 refills | Status: AC

## 2017-11-30 NOTE — Addendum Note (Signed)
Addended by: Ernestina PatchesAPEL, Vernona Peake S on: 11/30/2017 11:21 AM   Modules accepted: Orders

## 2017-11-30 NOTE — Progress Notes (Signed)
  Patient was seen on 11/30/2017 for Gestational Diabetes self-management. EDD 12/27/2017. Patient states no history of GDM. Diet history obtained. Patient eats fair variety of all food groups but eats erratically due to her work schedule. 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 Beverages include water, juices, regular sodas throughout the day.  The following learning objectives were met by the patient :   States the definition of Gestational Diabetes  States why dietary management is important in controlling blood glucose  Describes the effects of carbohydrates on blood glucose levels  Demonstrates ability to create a balanced meal plan  Demonstrates carbohydrate counting   States when to check blood glucose levels  Demonstrates proper blood glucose monitoring techniques  States the effect of stress and exercise on blood glucose levels  States the importance of limiting caffeine and abstaining from alcohol and smoking  Plan:  Based on her height of 68", I suggest she aim for 4 Carb Choices per meal (60 grams) +/- 1 either way  Aim for 1-2 Carbs per snack Begin reading food labels for Total Carbohydrate of foods If OK with your MD, consider  increasing your activity level by walking, Arm Chair Exercises or other activity daily as tolerated Begin checking BG before breakfast and 2 hours after first bite of breakfast, lunch and dinner as directed by MD  Bring Log Book/Sheet to every medical appointment   Baby Scripts:  Patient was introduced to [employer]  Patient not appropriate for [employer] due to- she does not have   access to smart phone or data right now. Take medication if directed by MD  Blood glucose monitor Rx called into pharmacy: [place] with Fast Clix drums Patient instructed to test pre breakfast and 2 hours each meal as directed by MD  Patient instructed to monitor glucose levels: FBS: 60 - 95 mg/dl 2  hour: <120 mg/dl  Patient received the following handouts:  Nutrition Diabetes and Pregnancy  Carbohydrate Counting List  BG Log Sheet  Patient will be seen for follow-up as needed.

## 2017-12-01 LAB — CERVICOVAGINAL ANCILLARY ONLY
BACTERIAL VAGINITIS: NEGATIVE
CHLAMYDIA, DNA PROBE: NEGATIVE
Candida vaginitis: POSITIVE — AB
Neisseria Gonorrhea: NEGATIVE
TRICH (WINDOWPATH): NEGATIVE

## 2017-12-02 MED ORDER — TERCONAZOLE 0.4 % VA CREA: 1.0000 | TOPICAL_CREAM | Freq: Every day | VAGINAL | 0 refills | Status: DC

## 2017-12-02 NOTE — Addendum Note (Signed)
Addended by: Thressa ShellerHOGAN, Linnaea Ahn D on: 12/02/2017 01:02 AM   Modules accepted: Orders

## 2017-12-03 ENCOUNTER — Telehealth: Payer: Self-pay

## 2017-12-03 LAB — CULTURE, BETA STREP (GROUP B ONLY): STREP GP B CULTURE: NEGATIVE

## 2017-12-03 NOTE — Telephone Encounter (Addendum)
-----   Message from Susan ReichertHeather D Hogan, CNM sent at 12/02/2017  1:01 AM EDT ----- Patient with yeast. I have sent in rx to her pharmacy. Please call her.   Attempted to contact pt unable to LM due to message stating "please try your call again later" and then hang ups.  Left note in appt to notify pt.

## 2017-12-08 ENCOUNTER — Encounter: Payer: Self-pay | Admitting: Obstetrics and Gynecology

## 2017-12-08 ENCOUNTER — Ambulatory Visit (INDEPENDENT_AMBULATORY_CARE_PROVIDER_SITE_OTHER): Payer: Medicaid Other | Admitting: Obstetrics and Gynecology

## 2017-12-08 VITALS — BP 104/83 | HR 98 | Wt 291.0 lb

## 2017-12-08 DIAGNOSIS — O34219 Maternal care for unspecified type scar from previous cesarean delivery: Secondary | ICD-10-CM

## 2017-12-08 DIAGNOSIS — Z3480 Encounter for supervision of other normal pregnancy, unspecified trimester: Secondary | ICD-10-CM

## 2017-12-08 DIAGNOSIS — O99332 Smoking (tobacco) complicating pregnancy, second trimester: Secondary | ICD-10-CM

## 2017-12-08 DIAGNOSIS — O24410 Gestational diabetes mellitus in pregnancy, diet controlled: Secondary | ICD-10-CM

## 2017-12-08 NOTE — Progress Notes (Signed)
   PRENATAL VISIT NOTE  Subjective:  Susan English is a 27 y.o. G2P1001 at 5759w2d being seen today for ongoing prenatal care.  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  Patient reports no complaints.  Contractions: Not present. Vag. Bleeding: None.  Movement: Present. Denies leaking of fluid.   The following portions of the patient's history were reviewed and updated as appropriate: allergies, current medications, past family history, past medical history, past social history, past surgical history and problem list. Problem list updated.  Objective:   Vitals:   12/08/17 1451  BP: 104/83  Pulse: 98  Weight: 291 lb (132 kg)    Fetal Status: Fetal Heart Rate (bpm): 130 Fundal Height: 38 cm Movement: Present     General:  Alert, oriented and cooperative. Patient is in no acute distress.  Skin: Skin is warm and dry. No rash noted.   Cardiovascular: Normal heart rate noted  Respiratory: Normal respiratory effort, no problems with respiration noted  Abdomen: Soft, gravid, appropriate for gestational age.  Pain/Pressure: Present     Pelvic: Cervical exam deferred        Extremities: Normal range of motion.  Edema: Trace  Mental Status: Normal mood and affect. Normal behavior. Normal judgment and thought content.   Assessment and Plan:  Pregnancy: G2P1001 at 4559w2d  1. Supervision of other normal pregnancy, antepartum Patient is doing well without complaints  2.  Tobacco smoking complicating pregnancy in second trimester   3. Diet controlled gestational diabetes mellitus (GDM) in third trimester Patient did not bring CBG log as she doesn't have a meter. She has been checking CBGs with her mother's meter occasionally and reports fasting as high as 98 and pp as high as 124 Advised patient to bring log at next visit as it will determine timing of IOL 39 vs 40 weeks  4. Previous cesarean delivery affecting pregnancy, antepartum Desires TOLAC  Term labor symptoms and general obstetric precautions including but not limited to vaginal bleeding, contractions, leaking of fluid and fetal movement were reviewed in detail with the patient. Please refer to After Visit Summary for other counseling recommendations.  Return in about 1 week (around 12/15/2017) for ROB.  No future appointments.  Catalina AntiguaPeggy Xyla Leisner, MD

## 2017-12-15 ENCOUNTER — Ambulatory Visit (INDEPENDENT_AMBULATORY_CARE_PROVIDER_SITE_OTHER): Payer: Medicaid Other | Admitting: Obstetrics & Gynecology

## 2017-12-15 VITALS — BP 110/87 | HR 106 | Wt 290.0 lb

## 2017-12-15 DIAGNOSIS — O99213 Obesity complicating pregnancy, third trimester: Secondary | ICD-10-CM

## 2017-12-15 DIAGNOSIS — O34219 Maternal care for unspecified type scar from previous cesarean delivery: Secondary | ICD-10-CM

## 2017-12-15 DIAGNOSIS — O99212 Obesity complicating pregnancy, second trimester: Secondary | ICD-10-CM

## 2017-12-15 DIAGNOSIS — Z3480 Encounter for supervision of other normal pregnancy, unspecified trimester: Secondary | ICD-10-CM

## 2017-12-15 DIAGNOSIS — O99332 Smoking (tobacco) complicating pregnancy, second trimester: Secondary | ICD-10-CM

## 2017-12-15 DIAGNOSIS — R87613 High grade squamous intraepithelial lesion on cytologic smear of cervix (HGSIL): Secondary | ICD-10-CM

## 2017-12-15 DIAGNOSIS — O99333 Smoking (tobacco) complicating pregnancy, third trimester: Secondary | ICD-10-CM

## 2017-12-15 NOTE — Progress Notes (Signed)
   PRENATAL VISIT NOTE  Subjective:  Susan English is a 27 y.o. G2P1001 at 6670w2d being seen today for ongoing prenatal care.  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  Patient reports no complaints.  Contractions: Not present. Vag. Bleeding: None.  Movement: Present. Denies leaking of fluid.   The following portions of the patient's history were reviewed and updated as appropriate: allergies, current medications, past family history, past medical history, past social history, past surgical history and problem list. Problem list updated.  Objective:   Vitals:   12/15/17 1023  BP: 110/87  Pulse: (!) 106  Weight: 290 lb (131.5 kg)    Fetal Status: Fetal Heart Rate (bpm): 155   Movement: Present     General:  Alert, oriented and cooperative. Patient is in no acute distress.  Skin: Skin is warm and dry. No rash noted.   Cardiovascular: Normal heart rate noted  Respiratory: Normal respiratory effort, no problems with respiration noted  Abdomen: Soft, gravid, appropriate for gestational age.  Pain/Pressure: Present     Pelvic: Cervical exam deferred        Extremities: Normal range of motion.  Edema: Trace  Mental Status: Normal mood and affect. Normal behavior. Normal judgment and thought content.   Assessment and Plan:  Pregnancy: G2P1001 at 3470w2d  1. Supervision of other normal pregnancy, antepartum  2. Tobacco smoking complicating pregnancy in second  trimester Pt has not smoked for 3 weeks  3. Previous cesarean delivery, antepartum condition or complication Desires TOLAC  4. Obesity complicating pregnancy in second trimester  5. 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 GDM A1 Pt did not bring her glc log. She reports her fasting as all less than 91. She reports that her highest PP was 138.  I encouraged her to bring her glc log with her each visit   Term labor symptoms and general obstetric precautions including but not limited to vaginal bleeding, contractions, leaking of fluid and fetal movement were reviewed in detail with the patient. Please refer to After Visit Summary for other counseling recommendations.  Return in about 1 week (around 12/22/2017).  No future appointments.  Willodean Rosenthalarolyn Harraway-Smith, MD

## 2017-12-15 NOTE — Patient Instructions (Signed)
Trial of Labor After Cesarean Delivery A trial of labor after cesarean delivery (TOLAC) is when a woman tries to give birth vaginally after a previous cesarean delivery. TOLAC may be a safe and appropriate option for you depending on your medical history and other risk factors. When TOLAC is successful and you are able to have a vaginal delivery, this is called a vaginal birth after cesarean delivery (VBAC). Candidates for TOLAC TOLAC is possible for some women who:  Have undergone one or two prior cesarean deliveries in which the incision of the uterus was horizontal (low transverse).  Are carrying twins and have had one prior low transverse incision during a cesarean delivery.  Do not have a vertical (classical) uterine scar.  Have not had a tear in the wall of their uterus (uterine rupture).  TOLAC is also supported for women who meet appropriate criteria and:  Are under the age of 40 years.  Are tall and have a body mass index (BMI) of less than 30.  Have an unknown uterine scar.  Give birth in a facility equipped to handle an emergency cesarean delivery. This team should be able to handle possible complications such as a uterine rupture.  Have thorough counseling about the benefits and risks of TOLAC.  Have discussed future pregnancy plans with their health care provider.  Plan to have several more pregnancies.  Most successful candidates for TOLAC:  Have had a successful vaginal delivery before or after their cesarean delivery.  Experience labor that begins naturally on or before the due date (40 weeks of gestation).  Do not have a very large (macrosomic) baby.  Had a prior cesarean delivery but are not currently experiencing factors that would prompt a cesarean delivery (such as a breech position).  Had only one prior cesarean delivery.  Had a prior cesarean delivery that was performed early in labor and not after full cervical dilation. TOLAC may be most appropriate  for women who meet the above guidelines and who plan to have more pregnancies. TOLAC is not recommended for home births. Least successful candidates for TOLAC:  Have an induced labor with an unfavorable cervix. An unfavorable cervix is when the cervix is not dilating enough (among other factors).  Have never had a vaginal delivery.  Have had more than two cesarean deliveries.  Have a pregnancy at more than 40 weeks of gestation.  Are pregnant with a baby with a suspected weight greater than 4,000 grams (8 pounds) and who have no prior history of a vaginal delivery.  Have closely spaced pregnancies. Suggested benefits of TOLAC  You may have a faster recovery time.  You may have a shorter stay in the hospital.  You may have less pain and fewer problems than with a cesarean delivery. Women who have a cesarean delivery have a higher chance of needing blood or getting a fever, an infection, or a blood clot in the legs. Suggested risks of TOLAC The highest risk of complications happens to women who attempt a TOLAC and fail. A failed TOLAC results in an unplanned cesarean delivery. Risks related to TOLAC or repeat cesarean deliveries include:  Blood loss.  Infection.  Blood clot.  Injury to surrounding tissues or organs.  Having to remove the uterus (hysterectomy).  Potential problems with the placenta (such as placenta previa or placenta accreta) in future pregnancies.  Although very rare, the main concerns with TOLAC are:  Rupture of the uterine scar from a past cesarean delivery.  Needing an emergency   cesarean delivery.  Having a bad outcome for the baby (perinatal morbidity).  Where to find more information:  American Congress of Obstetricians and Gynecologists: www.acog.org  American College of Nurse-Midwives: www.midwife.org This information is not intended to replace advice given to you by your health care provider. Make sure you discuss any questions you have with  your health care provider. Document Released: 01/13/2011 Document Revised: 03/25/2016 Document Reviewed: 10/17/2012 Elsevier Interactive Patient Education  2018 [employer]  

## 2017-12-22 ENCOUNTER — Ambulatory Visit (INDEPENDENT_AMBULATORY_CARE_PROVIDER_SITE_OTHER): Payer: Medicaid Other | Admitting: Medical

## 2017-12-22 ENCOUNTER — Encounter: Payer: Self-pay | Admitting: Medical

## 2017-12-22 VITALS — BP 116/78 | HR 102 | Wt 287.0 lb

## 2017-12-22 DIAGNOSIS — O34219 Maternal care for unspecified type scar from previous cesarean delivery: Secondary | ICD-10-CM

## 2017-12-22 DIAGNOSIS — O24410 Gestational diabetes mellitus in pregnancy, diet controlled: Secondary | ICD-10-CM

## 2017-12-22 DIAGNOSIS — Z3480 Encounter for supervision of other normal pregnancy, unspecified trimester: Secondary | ICD-10-CM

## 2017-12-22 NOTE — Patient Instructions (Addendum)
Research childbirth classes and hospital preregistration at [email or web address]  Fetal Movement Counts Patient Name: ________________________________________________ Patient Due Date: ____________________ What is a fetal movement count? A fetal movement count is the number of times that you feel your baby move during a certain amount of time. This may also be called a fetal kick count. A fetal movement count is recommended for every pregnant woman. You may be asked to start counting fetal movements as early as week 28 of your pregnancy. Pay attention to when your baby is most active. You may notice your baby's sleep and wake cycles. You may also notice things that make your baby move more. You should do a fetal movement count:  When your baby is normally most active.  At the same time each day.  A good time to count movements is while you are resting, after having something to eat and drink. How do I count fetal movements? 1. Find a quiet, comfortable area. Sit, or lie down on your side. 2. Write down the date, the start time and stop time, and the number of movements that you felt between those two times. Take this information with you to your health care visits. 3. For 2 hours, count kicks, flutters, swishes, rolls, and jabs. You should feel at least 10 movements during 2 hours. 4. You may stop counting after you have felt 10 movements. 5. If you do not feel 10 movements in 2 hours, have something to eat and drink. Then, keep resting and counting for 1 hour. If you feel at least 4 movements during that hour, you may stop counting. Contact a health care provider if:  You feel fewer than 4 movements in 2 hours.  Your baby is not moving like he or she usually does. Date: ____________ Start time: ____________ Stop time: ____________ Movements: ____________ Date: ____________ Start time: ____________ Stop time: ____________ Movements: ____________ Date: ____________ Start time: ____________  Stop time: ____________ Movements: ____________ Date: ____________ Start time: ____________ Stop time: ____________ Movements: ____________ Date: ____________ Start time: ____________ Stop time: ____________ Movements: ____________ Date: ____________ Start time: ____________ Stop time: ____________ Movements: ____________ Date: ____________ Start time: ____________ Stop time: ____________ Movements: ____________ Date: ____________ Start time: ____________ Stop time: ____________ Movements: ____________ Date: ____________ Start time: ____________ Stop time: ____________ Movements: ____________ This information is not intended to replace advice given to you by your health care provider. Make sure you discuss any questions you have with your health care provider. Document Released: 05/27/2006 Document Revised: 12/25/2015 Document Reviewed: 06/06/2015 Elsevier Interactive Patient Education  2018 [employer]  Braxton Hicks Contractions Contractions of the uterus can occur throughout pregnancy, but they are not always a sign that you are in labor. You may have practice contractions called Braxton Hicks contractions. These false labor contractions are sometimes confused with true labor. What are Braxton Hicks contractions? Braxton Hicks contractions are tightening movements that occur in the muscles of the uterus before labor. Unlike true labor contractions, these contractions do not result in opening (dilation) and thinning of the cervix. Toward the end of pregnancy (32-34 weeks), Braxton Hicks contractions can happen more often and may become stronger. These contractions are sometimes difficult to tell apart from true labor because they can be very uncomfortable. You should not feel embarrassed if you go to the hospital with false labor. Sometimes, the only way to tell if you are in true labor is for your health care provider to look for changes in the cervix. The health care provider will   do a physical  exam and may monitor your contractions. If you are not in true labor, the exam should show that your cervix is not dilating and your water has not broken. If there are other health problems associated with your pregnancy, it is completely safe for you to be sent home with false labor. You may continue to have Braxton Hicks contractions until you go into true labor. How to tell the difference between true labor and false labor True labor  Contractions last 30-70 seconds.  Contractions become very regular.  Discomfort is usually felt in the top of the uterus, and it spreads to the lower abdomen and low back.  Contractions do not go away with walking.  Contractions usually become more intense and increase in frequency.  The cervix dilates and gets thinner. False labor  Contractions are usually shorter and not as strong as true labor contractions.  Contractions are usually irregular.  Contractions are often felt in the front of the lower abdomen and in the groin.  Contractions may go away when you walk around or change positions while lying down.  Contractions get weaker and are shorter-lasting as time goes on.  The cervix usually does not dilate or become thin. Follow these instructions at home:  Take over-the-counter and prescription medicines only as told by your health care provider.  Keep up with your usual exercises and follow other instructions from your health care provider.  Eat and drink lightly if you think you are going into labor.  If Braxton Hicks contractions are making you uncomfortable: ? Change your position from lying down or resting to walking, or change from walking to resting. ? Sit and rest in a tub of warm water. ? Drink enough fluid to keep your urine pale yellow. Dehydration may cause these contractions. ? Do slow and deep breathing several times an hour.  Keep all follow-up prenatal visits as told by your health care provider. This is  important. Contact a health care provider if:  You have a fever.  You have continuous pain in your abdomen. Get help right away if:  Your contractions become stronger, more regular, and closer together.  You have fluid leaking or gushing from your vagina.  You pass blood-tinged mucus (bloody show).  You have bleeding from your vagina.  You have low back pain that you never had before.  You feel your baby's head pushing down and causing pelvic pressure.  Your baby is not moving inside you as much as it used to. Summary  Contractions that occur before labor are called Braxton Hicks contractions, false labor, or practice contractions.  Braxton Hicks contractions are usually shorter, weaker, farther apart, and less regular than true labor contractions. True labor contractions usually become progressively stronger and regular and they become more frequent.  Manage discomfort from Braxton Hicks contractions by changing position, resting in a warm bath, drinking plenty of water, or practicing deep breathing. This information is not intended to replace advice given to you by your health care provider. Make sure you discuss any questions you have with your health care provider. Document Released: 09/10/2016 Document Revised: 09/10/2016 Document Reviewed: 09/10/2016 Elsevier Interactive Patient Education  2018 Elsevier Inc.    AREA PEDIATRIC/FAMILY PRACTICE PHYSICIANS  [facility] [address], [address] [place], [place]  [address] Phone - [phone]   Fax - [phone]  [facility] [address] [place], [place] [address] Phone - [phone]   Fax -   [phone]  [facility] [address] [place], [place]  [address] Phone - [phone]   Fax - [phone]  [facility] [address] [place], [place]  [address] Phone - [phone]   Fax - [phone]  [facility] [address] [place], [place]  [address] Phone - [phone]   Fax - [phone]  [facility] [address] [place], [place]  [address] Phone - [phone]   Fax - [phone]  [facility] [address] [place], [place]  [address] Phone - [phone]   Fax - [phone]  [facility] [address] [place], [place]  [address] Phone - [phone]   Fax - [phone]  [facility] [address] [place], [place]  [address] Phone - [phone]   Fax - [phone] [facility] [address] [place], [place]  [address] Phone - [phone]   Fax - [phone]  [facility] [address] [place], [place]  [address] Phone - [phone]   Fax - [phone]  [facility] [address] [place], [place]  [address] Phone - [phone]   Fax - [phone]  [facility] [address] [place], [place]  [address] Phone - [phone]   Fax - [phone]  [facility]IL[facility] GOSR[facility] [address] [place], [place]  [address] Phone - [phone]  [facility] [address] [place], [place]  [address] Phone - [phone]   Fax - [phone]  [facility] [address] [place], [place]  [address] Phone - [phone]   Fax - [phone]  [facility] [address] [place], [place]  [address] Phone - [phone]   Fax - [phone]  [facility] [address] [place], [place]  [address] Phone - [phone]   Fax - [phone]   [facility] [address] [place], [place]  [address] Phone - [phone]   Fax - [phone]  [facility] [address] [place], [place]  [address] Phone - [phone]   Fax -  [phone]  [facility] RUB[facility] [address] [place], [place]  [address] Phone - [phone]   Fax - [phone]  [facility] [address] [place], [place]  [address] Phone - [phone]   Fax - [phone]  [facility] [address] [place], [place]  [address] Phone - [phone]   Fax - [phone] [facility] [address] [place], [place]  [address] Phone - [phone]   Fax - [phone]  [facility] [address] [place], [place]  [address] Phone - [phone]   Fax - [phone]  [facility] [address] [place], [place]  [address] Phone - [phone]   Fax - [phone]  [facility] Charlene Flemming MD [address] [place] [address] Phone [phone]  Fax [phone]      

## 2017-12-22 NOTE — Progress Notes (Signed)
   PRENATAL VISIT NOTE  Subjective:  Susan English is a 27 y.o. G2P1001 at [redacted]w[redacted]d being seen today for ongoing prenatal care.  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  Patient reports no complaints.  Contractions: Not present. Vag. Bleeding: None.  Movement: Present. Denies leaking of fluid.   The following portions of the patient's history were reviewed and updated as appropriate: allergies, current medications, past family history, past medical history, past social history, past surgical history and problem list. Problem list updated.  Objective:   Vitals:   12/22/17 1507  BP: 116/78  Pulse: (!) 102  Weight: 287 lb (130.2 kg)    Fetal Status: Fetal Heart Rate (bpm): 154 Fundal Height: 42 cm Movement: Present     General:  Alert, oriented and cooperative. Patient is in no acute distress.  Skin: Skin is warm and dry. No rash noted.   Cardiovascular: Normal heart rate noted  Respiratory: Normal respiratory effort, no problems with respiration noted  Abdomen: Soft, gravid, appropriate for gestational age.  Pain/Pressure: Present     Pelvic: Cervical exam deferred        Extremities: Normal range of motion.  Edema: Trace  Mental Status: Normal mood and affect. Normal behavior. Normal judgment and thought content.   Assessment and Plan:  Pregnancy: G2P1001 at 672w2d  1. Supervision of other normal pregnancy, antepartum - IOL 12/27/17  2. Diet controlled  gestational diabetes mellitus (GDM) in third trimester - Patient logs reviewed, incomplete, but mostly good values aside from a couple PP - Patient is not following diet consistently   3. Previous cesarean delivery affecting pregnancy, antepartum - Planning TOLAC  Preterm labor symptoms and general obstetric precautions including but not limited to vaginal bleeding, contractions, leaking of fluid and fetal movement were reviewed in detail with the patient. Please refer to After Visit Summary for other counseling recommendations.  Return in about 5 weeks (around 01/26/2018) for PP visit.  Vonzella NippleJulie Khamya Topp, PA-C

## 2017-12-27 ENCOUNTER — Encounter (HOSPITAL_COMMUNITY): Payer: Self-pay | Admitting: Obstetrics

## 2017-12-27 ENCOUNTER — Inpatient Hospital Stay (HOSPITAL_COMMUNITY)
Admission: AD | Admit: 2017-12-27 | Discharge: 2017-12-31 | DRG: 807 | Disposition: A | Discharge status: Home / Self Care | Payer: Medicaid Other | Attending: Obstetrics and Gynecology | Admitting: Obstetrics and Gynecology

## 2017-12-27 DIAGNOSIS — Z91040 Latex allergy status: Secondary | ICD-10-CM | POA: Diagnosis not present

## 2017-12-27 DIAGNOSIS — O99214 Obesity complicating childbirth: Secondary | ICD-10-CM | POA: Diagnosis present

## 2017-12-27 DIAGNOSIS — O24420 Gestational diabetes mellitus in childbirth, diet controlled: Principal | ICD-10-CM | POA: Diagnosis present

## 2017-12-27 DIAGNOSIS — Z87891 Personal history of nicotine dependence: Secondary | ICD-10-CM

## 2017-12-27 DIAGNOSIS — Z3A40 40 weeks gestation of pregnancy: Secondary | ICD-10-CM | POA: Diagnosis not present

## 2017-12-27 DIAGNOSIS — O24429 Gestational diabetes mellitus in childbirth, unspecified control: Secondary | ICD-10-CM | POA: Diagnosis not present

## 2017-12-27 DIAGNOSIS — O24410 Gestational diabetes mellitus in pregnancy, diet controlled: Secondary | ICD-10-CM

## 2017-12-27 DIAGNOSIS — Z3480 Encounter for supervision of other normal pregnancy, unspecified trimester: Secondary | ICD-10-CM

## 2017-12-27 DIAGNOSIS — O34219 Maternal care for unspecified type scar from previous cesarean delivery: Secondary | ICD-10-CM | POA: Diagnosis present

## 2017-12-27 LAB — CBC
HCT: 33.2 % — ABNORMAL LOW (ref 36.0–46.0)
Hemoglobin: 11 g/dL — ABNORMAL LOW (ref 12.0–15.0)
MCH: 29.2 pg (ref 26.0–34.0)
MCHC: 33.1 g/dL (ref 30.0–36.0)
MCV: 88.1 fL (ref 78.0–100.0)
PLATELETS: 226 10*3/uL (ref 150–400)
RBC: 3.77 MIL/uL — ABNORMAL LOW (ref 3.87–5.11)
RDW: 14 % (ref 11.5–15.5)
WBC: 12.8 10*3/uL — ABNORMAL HIGH (ref 4.0–10.5)

## 2017-12-27 LAB — GLUCOSE, CAPILLARY
GLUCOSE-CAPILLARY: 83 mg/dL (ref 70–99)
GLUCOSE-CAPILLARY: 68 mg/dL — AB (ref 70–99)
Glucose-Capillary: 69 mg/dL — ABNORMAL LOW (ref 70–99)
Glucose-Capillary: 69 mg/dL — ABNORMAL LOW (ref 70–99)

## 2017-12-27 LAB — TYPE AND SCREEN
ABO/RH(D): O POS
ANTIBODY SCREEN: NEGATIVE

## 2017-12-27 LAB — ABO/RH: ABO/RH(D): O POS

## 2017-12-27 MED ORDER — OXYCODONE-ACETAMINOPHEN 5-325 MG PO TABS
1.0000 | ORAL_TABLET | ORAL | Status: DC | PRN
  Administered 2017-12-29: 1 via ORAL
  Filled 2017-12-27: qty 1

## 2017-12-27 MED ORDER — ONDANSETRON HCL 4 MG/2ML IJ SOLN: 4.0000 mg | Freq: Four times a day (QID) | INTRAMUSCULAR | Status: DC | PRN

## 2017-12-27 MED ORDER — TERBUTALINE SULFATE 1 MG/ML IJ SOLN
0.2500 mg | Freq: Once | INTRAMUSCULAR | Status: DC | PRN
  Filled 2017-12-27: qty 1

## 2017-12-27 MED ORDER — FLEET ENEMA 7-19 GM/118ML RE ENEM: 1.0000 | ENEMA | RECTAL | Status: DC | PRN

## 2017-12-27 MED ORDER — ACETAMINOPHEN 325 MG PO TABS: 650.0000 mg | ORAL_TABLET | ORAL | Status: DC | PRN

## 2017-12-27 MED ORDER — LACTATED RINGERS IV SOLN
INTRAVENOUS | Status: DC
Start: 2017-12-27 — End: 2017-12-29
  Administered 2017-12-27 – 2017-12-28 (×4): via INTRAVENOUS

## 2017-12-27 MED ORDER — OXYCODONE-ACETAMINOPHEN 5-325 MG PO TABS: 2.0000 | ORAL_TABLET | ORAL | Status: DC | PRN

## 2017-12-27 MED ORDER — OXYTOCIN BOLUS FROM INFUSION
500.0000 mL | Freq: Once | INTRAVENOUS | Status: AC
  Administered 2017-12-29: 500 mL via INTRAVENOUS

## 2017-12-27 MED ORDER — SOD CITRATE-CITRIC ACID 500-334 MG/5ML PO SOLN: 30.0000 mL | ORAL | Status: DC | PRN

## 2017-12-27 MED ORDER — LIDOCAINE HCL (PF) 1 % IJ SOLN
30.0000 mL | INTRAMUSCULAR | Status: DC | PRN
Start: 2017-12-27 — End: 2017-12-29
  Filled 2017-12-27: qty 30

## 2017-12-27 MED ORDER — LACTATED RINGERS IV SOLN: 500.0000 mL | INTRAVENOUS | Status: DC | PRN

## 2017-12-27 MED ORDER — OXYTOCIN 40 UNITS IN LACTATED RINGERS INFUSION - SIMPLE MED
2.5000 [IU]/h | INTRAVENOUS | Status: DC
  Filled 2017-12-27: qty 1000

## 2017-12-27 MED ORDER — OXYTOCIN 40 UNITS IN LACTATED RINGERS INFUSION - SIMPLE MED
1.0000 m[IU]/min | INTRAVENOUS | Status: DC
  Administered 2017-12-27: 1 m[IU]/min via INTRAVENOUS
  Administered 2017-12-28: 20 m[IU]/min via INTRAVENOUS
  Filled 2017-12-27: qty 1000

## 2017-12-27 NOTE — Anesthesia Pain Management Evaluation Note (Signed)
  [facility] Pain Management Visit Note  Patient: Susan English, 27 y.o., female  "Hello I am a member of the anesthesia team at [facility]. We have an anesthesia team available at all times to provide care throughout the hospital, including epidural management and anesthesia for C-section. I don't know your plan for the delivery whether it a natural birth, water birth, IV sedation, nitrous supplementation, doula or epidural, but we want to meet your pain goals."   1.Was your pain managed to your expectations on prior hospitalizations?   Yes   2.What is your expectation for pain management during this hospitalization?     Epidural  3.How can we help you reach that goal? unsure  Record the patient's initial score and the patient's pain goal.   Pain: 0  Pain Goal: 5 [facility] wants you to be able to say your pain was always managed very well.  Cephus ShellingBURGER,Susan English 12/27/2017

## 2017-12-27 NOTE — Progress Notes (Signed)
LABOR PROGRESS NOTE  Deirdre Evenershley C Crispo is a 27 y.o. G2P1001 at 6150w0d  admitted for IOL for A1GDM. Patient is TOLAC. Previous CS for failure to progress.   Subjective: Patient comfortable.   Objective: BP 138/84   Pulse 86   Temp 97.8 F (36.6 C) (Oral)   Resp 18   Ht 5\' 8"  (1.727 m)   Wt 128.8 kg   LMP 02/22/2017 (Approximate)   BMI 43.18 kg/m  or  Vitals:   12/27/17 1247 12/27/17 1451 12/27/17 1614 12/27/17 1615  BP: 112/64 129/85  138/84  Pulse: 87 90  86  Resp: 18 16 18    Temp: 98.1 F (36.7 C)  97.8 F (36.6 C)   TempSrc: Oral  Oral   Weight:      Height:         Dilation: 2 Effacement (%): Thick Cervical Position: Middle Station: -3 Presentation: Vertex Exam by:: dr Mars Scheaffer FHT: baseline rate 140, moderate varibility, +acel, nodecel Toco: q4 min   Labs: Lab Results  Component Value Date   WBC 12.8 (H) 12/27/2017   HGB 11.0 (L) 12/27/2017   HCT 33.2 (L) 12/27/2017   MCV 88.1 12/27/2017   PLT 226 12/27/2017    Patient Active Problem List   Diagnosis Date Noted  . GDM, class A1 12/27/2017  . Gestational diabetes 11/29/2017  . Acute pain of right knee 11/05/2017  . Previous cesarean delivery affecting pregnancy, antepartum 10/08/2017  . Obesity complicating pregnancy in second trimester   . Tobacco smoking complicating pregnancy in second trimester   . Status post cryotherapy of skin lesion 06/11/2017  . Smoker 06/11/2017  . Supervision of other normal pregnancy, antepartum 06/10/2017  . Previous cesarean delivery, antepartum condition or complication 06/10/2017  . HSIL (high grade squamous intraepithelial lesion) on Pap smear of cervix 05/13/2016    Assessment / Plan: 27 y.o. G2P1001 at 6650w0d here for IOL for A1GDM.   A1GDM: CBGs q4h. Last CBG 69.  Labor: Has been on Pitocin. Currently at 338mu/min. Cervix favorable for FB, placed at 1615. Hold Pitocin for now and titrate once FB out.  Fetal Wellbeing:  Cat I  Pain Control:  Epidural upon request   Anticipated MOD:  NSVD   Marcy Sirenatherine Angelena Sand, D.O. OB Fellow  12/27/2017, 4:19 PM

## 2017-12-27 NOTE — H&P (Addendum)
OBSTETRIC ADMISSION HISTORY AND PHYSICAL  Susan English is a 27 y.o. female G2P1001 with IUP at 3644w0d by 1st tri U/S presenting for IOL at 4444w0d due to GDM. She reports +FMs, No LOF, no VB, no blurry vision, headaches or peripheral edema, and RUQ pain.  She plans on breastfeeding and bottle feeding. She request Nexplanon for birth control. [facility]   Not feeling contractions Small blood with vaginal discharge; on Friday, had blood on tissue No headaches, vision changes, RUQ pain, lower extremity edema No smoking, alcohol, heroin, methamphetamines, cocaine   Dating: By 1st tri U/S --->  Estimated Date of Delivery: 12/27/17  Sono:    @[redacted]w[redacted]d , normal anatomy, cephalic presentation, vertex lie, 2411g, 83% EFW  Prenatal History/Complications:  Past Medical History: Past Medical History:  Diagnosis Date  . Headache(784.0)   . 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 Obesity     Past Surgical History: Past Surgical History:  Procedure Laterality Date  . CESAREAN SECTION    . ORIF ANKLE FRACTURE Right 12/22/2012   Procedure: OPEN REDUCTION INTERNAL FIXATION (ORIF) ANKLE FRACTURE;  Surgeon: Mable ParisJustin William Chandler, MD;  Location: [facility];  Service: Orthopedics;  Laterality: Right;  . sweat gland      Obstetrical History: OB History    Gravida  2   Para  1   Term  1   Preterm      AB      Living  1     SAB      TAB      Ectopic      Multiple      Live Births  1           Social History: Social History   Socioeconomic History  . Marital status: Single    Spouse name: Not on file  . Number of children: Not on file  . Years of education: Not on file  . Highest education level: Not on file  Occupational History  . Not on file  Social Needs  . Financial resource strain: Not on file  . Food insecurity:    Worry: Not on file    Inability: Not on file  . Transportation  needs:    Medical: Not on file    Non-medical: Not on file  Tobacco Use  . Smoking status: Former Smoker    Packs/day: 0.50  . Smokeless tobacco: Never Used  Substance and Sexual Activity  . Alcohol use: No    Frequency: Never    Comment: occ  . Drug use: No  . Sexual activity: Yes    Birth control/protection: None    Comment: Nexplanon expired   Lifestyle  . Physical activity:    Days per week: Not on file    Minutes per session: Not on file  . Stress: Not on file  Relationships  . Social connections:    Talks on phone: Not on file    Gets together: Not on file    Attends religious service: Not on file    Active member of club or organization: Not on file    Attends meetings of clubs or organizations: Not on file    Relationship status: Not on file  Other Topics Concern  . Not on file  Social History Narrative  . Not on file    Family History: Family History  Problem Relation Age of Onset  . Hypertension Mother   .  Diabetes Mother   . Diabetes Maternal Grandmother   . Breast cancer Paternal Grandmother     Allergies: Allergies  Allergen Reactions  . Latex Hives    Medications Prior to Admission  Medication Sig Dispense Refill Last Dose  . cyclobenzaprine (FLEXERIL) 5 MG tablet Take 1 tablet (5 mg total) by mouth every 8 (eight) hours as needed for muscle spasms. 20 tablet 0 12/26/2017 at Unknown time  . diphenhydrAMINE (BENADRYL) 25 MG tablet Take 25 mg by mouth daily.   12/25/2017  . HYDROcodone-acetaminophen (NORCO/VICODIN) 5-325 MG tablet Take 1 tablet by mouth every 4 (four) hours as needed for moderate pain.   12/26/2017 at Unknown time  . prenatal vitamin w/FE, FA (PRENATAL 1 + 1) 27-1 MG TABS tablet Take 1 tablet by mouth daily at 12 noon. 30 each 9 12/26/2017 at Unknown time  . ACCU-CHEK FASTCLIX LANCETS MISC 1 Device by Does not apply route 4 (four) times daily. 100 each 2 Taking  . glucose blood (ACCU-CHEK GUIDE) test strip Use as instructed QID 100 each  12 Taking  . hydrOXYzine (ATARAX/VISTARIL) 10 MG tablet Take 1 tablet (10 mg total) by mouth 3 (three) times daily as needed. (Patient taking differently: Take 10 mg by mouth 3 (three) times daily as needed for anxiety. ) 30 tablet 2 12/25/2017  . terconazole (TERAZOL 7) 0.4 % vaginal cream Place 1 applicator vaginally at bedtime. (Patient not taking: Reported on 12/08/2017) 45 g 0 Not Taking     Review of Systems   All systems reviewed and negative except as stated in HPI  Blood pressure 129/85, pulse 90, temperature 98.1 F (36.7 C), temperature source Oral, resp. rate 16, height 5\' 8"  (1.727 m), weight 128.8 kg, last menstrual period 02/22/2017. General appearance: alert, cooperative, appears stated age and no distress Lungs: clear to auscultation bilaterally Heart: regular rate and rhythm Abdomen: soft, non-tender; bowel sounds normal Pelvic: deferred  Extremities: Homans sign is negative, no sign of DVT Presentation: unsure Fetal monitoringBaseline: 140 bpm, Variability: Good {> 6 bpm), Accelerations: Reactive and Decelerations: Absent Dilation: Fingertip Effacement (%): Thick Station: -3 Exam by:: stone rnc   Prenatal labs: ABO, Rh: --/--/O POS, O POS Performed at [facility], [address] [place], [place] [address]  [phone](08/19 0853) Antibody: NEG (08/19 [id number]) Rubella: 3.16 (01/31 1521) RPR: Non Reactive (05/31 0832)  HBsAg: Negative (01/31 1521)  HIV: Non Reactive (05/31 0832)  GBS:   negative 1 hr Glucola 173 (N) Genetic screening  Not performed Anatomy US normal  Prenatal Transfer Tool  Maternal Diabetes: Yes:  Diabetes Type:  Diet controlled Genetic Screening: Declined Maternal Ultrasounds/Referrals: Normal Fetal Ultrasounds or other Referrals:  Referred to Materal Fetal Medicine  Maternal Substance Abuse:  No Significant Maternal Medications:  Meds include: Other: Norco/Vicadin, Flexerel, hydroxyzine  Significant Maternal Lab Results: None  Results for  orders placed or performed during the hospital encounter of 12/27/17 (from the past 24 hour(s))  Glucose, capillary   Collection Time: 12/27/17  8:49 AM  Result Value Ref Range   Glucose-Capillary 83 70 - 99 mg/dL  CBC   Collection Time: 12/27/17  8:53 AM  Result Value Ref Range   WBC 12.8 (H) 4.0 - 10.5 K/uL   RBC 3.77 (L) 3.87 - 5.11 MIL/uL   Hemoglobin 11.0 (L) 12.0 - 15.0 g/dL   HCT 08.633.2 (L) 57.836.0 - 46.946.0 %   MCV 88.1 78.0 - 100.0 fL   MCH 29.2 26.0 - 34.0 pg   MCHC 33.1 30.0 -  36.0 g/dL   RDW 16.1 09.6 - 04.5 %   Platelets 226 150 - 400 K/uL  Type and screen   Collection Time: 12/27/17  8:53 AM  Result Value Ref Range   ABO/RH(D) O POS    Antibody Screen NEG    Sample Expiration      12/30/2017 Performed at [facility], [address] [place], [place] [address]   ABO/Rh   Collection Time: 12/27/17  8:53 AM  Result Value Ref Range   ABO/RH(D)      O POS Performed at [facility], [address], [place], [place] [address]   Glucose, capillary   Collection Time: 12/27/17 12:48 PM  Result Value Ref Range   Glucose-Capillary 69 (L) 70 - 99 mg/dL    Patient Active Problem List   Diagnosis Date Noted  . GDM, class A1 12/27/2017  . Gestational diabetes 11/29/2017  . Acute pain of right knee 11/05/2017  . Previous cesarean delivery affecting pregnancy, antepartum 10/08/2017  . Obesity complicating pregnancy in second trimester   . Tobacco smoking complicating pregnancy in second trimester   . Status post cryotherapy of skin lesion 06/11/2017  . Smoker 06/11/2017  . Supervision of other normal pregnancy, antepartum 06/10/2017  . Previous cesarean delivery, antepartum condition or complication 06/10/2017  . 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 G2P1001 at [redacted]w[redacted]d here for IOL secondary to A1GDM. Patient is TOLAC.   #Labor:Inducing labor with Pitocin. TOLAC.   #Pain: No epidural at this time #FWB: Catagory I tracing, reassuring #ID:  GBS negative  #MOF: Breast and bottle #MOC: Nexplanon #Circ:  N/A (female)  Mirian Mo, MD  12/27/2017, 3:22 PM  OB FELLOW HISTORY AND PHYSICAL ATTESTATION  I have seen and examined this patient; I agree with above documentation in the resident's note.   Marcy Siren, D.O. OB Fellow  12/27/2017, 4:30 PM

## 2017-12-27 NOTE — Progress Notes (Signed)
Blood Glucose 68, pt given cranberry/apple juice to help raise sugar slightly.

## 2017-12-27 NOTE — Progress Notes (Signed)
LABOR PROGRESS NOTE  Deirdre Evenershley C Campion is a 27 y.o. G2P1001 at 7061w0d  admitted for IOL for A1GDM. Patient is TOLAC. Previous CS for failure to progress.   Subjective: Patient feeling more uncomfortable with ctx. Using nitrous oxide currently, which is helping.   Objective: BP 132/82   Pulse 84   Temp 97.8 F (36.6 C) (Oral)   Resp 18   Ht 5\' 8"  (1.727 m)   Wt 128.8 kg   LMP 02/22/2017 (Approximate)   BMI 43.18 kg/m  or  Vitals:   12/27/17 1614 12/27/17 1615 12/27/17 1703 12/27/17 1844  BP:  138/84 135/76 132/82  Pulse:  86 80 84  Resp: 18  18 18   Temp: 97.8 F (36.6 C)     TempSrc: Oral     Weight:      Height:         Dilation: 2.5(FB in place) Effacement (%): Thick Cervical Position: Middle Station: -3 Presentation: Vertex Exam by:: stone rnc FHT: baseline rate 145, moderate varibility, +acel, no decel Toco: q2-5 min  Labs: Lab Results  Component Value Date   WBC 12.8 (H) 12/27/2017   HGB 11.0 (L) 12/27/2017   HCT 33.2 (L) 12/27/2017   MCV 88.1 12/27/2017   PLT 226 12/27/2017    Patient Active Problem List   Diagnosis Date Noted  . GDM, class A1 12/27/2017  . Gestational diabetes 11/29/2017  . Acute pain of right knee 11/05/2017  . Previous cesarean delivery affecting pregnancy, antepartum 10/08/2017  . Obesity complicating pregnancy in second trimester   . Tobacco smoking complicating pregnancy in second trimester   . Status post cryotherapy of skin lesion 06/11/2017  . Smoker 06/11/2017  . Supervision of other normal pregnancy, antepartum 06/10/2017  . Previous cesarean delivery, antepartum condition or complication 06/10/2017  . HSIL (high grade squamous intraepithelial lesion) on Pap smear of cervix 05/13/2016    Assessment / Plan: 27 y.o. G2P1001 at 6661w0d here for IOL for A1GDM.   A1GDM: CBGs q4h. Last CBG 68. Given juice, repeat glucose pending.  Labor: TOLAC. Latent labor. FB remains in place. Holding pitocin, will titrate further once FB  out.  Fetal Wellbeing:  Cat I.  Pain Control:  Nitrous oxide. Epidural upon request.  Anticipated MOD:  NSVD  Marcy Sirenatherine Evoleht Hovatter, D.O. OB Fellow  12/27/2017, 7:11 PM

## 2017-12-28 ENCOUNTER — Inpatient Hospital Stay (HOSPITAL_COMMUNITY): Payer: Medicaid Other | Admitting: Anesthesiology

## 2017-12-28 ENCOUNTER — Other Ambulatory Visit: Payer: Self-pay

## 2017-12-28 ENCOUNTER — Encounter (HOSPITAL_COMMUNITY): Payer: Self-pay | Admitting: Anesthesiology

## 2017-12-28 LAB — GLUCOSE, CAPILLARY
GLUCOSE-CAPILLARY: 82 mg/dL (ref 70–99)
GLUCOSE-CAPILLARY: 70 mg/dL (ref 70–99)
GLUCOSE-CAPILLARY: 52 mg/dL — AB (ref 70–99)
GLUCOSE-CAPILLARY: 70 mg/dL (ref 70–99)
GLUCOSE-CAPILLARY: 99 mg/dL (ref 70–99)
Glucose-Capillary: 73 mg/dL (ref 70–99)
Glucose-Capillary: 65 mg/dL — ABNORMAL LOW (ref 70–99)
Glucose-Capillary: 97 mg/dL (ref 70–99)
Glucose-Capillary: 58 mg/dL — ABNORMAL LOW (ref 70–99)
Glucose-Capillary: 61 mg/dL — ABNORMAL LOW (ref 70–99)

## 2017-12-28 LAB — RPR: RPR: NONREACTIVE

## 2017-12-28 MED ORDER — PHENYLEPHRINE 40 MCG/ML (10ML) SYRINGE FOR IV PUSH (FOR BLOOD PRESSURE SUPPORT)
80.0000 ug | PREFILLED_SYRINGE | INTRAVENOUS | Status: DC | PRN
  Filled 2017-12-28: qty 10
  Filled 2017-12-28: qty 5

## 2017-12-28 MED ORDER — LACTATED RINGERS IV SOLN
500.0000 mL | Freq: Once | INTRAVENOUS | Status: AC
  Administered 2017-12-28: 500 mL via INTRAVENOUS

## 2017-12-28 MED ORDER — EPHEDRINE 5 MG/ML INJ
10.0000 mg | INTRAVENOUS | Status: DC | PRN
  Filled 2017-12-28: qty 2

## 2017-12-28 MED ORDER — OXYTOCIN 40 UNITS IN LACTATED RINGERS INFUSION - SIMPLE MED: 1.0000 m[IU]/min | INTRAVENOUS | Status: DC

## 2017-12-28 MED ORDER — DIPHENHYDRAMINE HCL 50 MG/ML IJ SOLN: 12.5000 mg | INTRAMUSCULAR | Status: DC | PRN

## 2017-12-28 MED ORDER — FENTANYL 2.5 MCG/ML BUPIVACAINE 1/10 % EPIDURAL INFUSION (WH - ANES)
14.0000 mL/h | INTRAMUSCULAR | Status: DC | PRN
  Administered 2017-12-28 – 2017-12-29 (×3): 14 mL/h via EPIDURAL
  Filled 2017-12-28 (×3): qty 100

## 2017-12-28 MED ORDER — LIDOCAINE HCL (PF) 1 % IJ SOLN
INTRAMUSCULAR | Status: DC | PRN
  Administered 2017-12-28: 7 mL via EPIDURAL
  Administered 2017-12-28: 6 mL via EPIDURAL

## 2017-12-28 MED ORDER — PHENYLEPHRINE 40 MCG/ML (10ML) SYRINGE FOR IV PUSH (FOR BLOOD PRESSURE SUPPORT)
80.0000 ug | PREFILLED_SYRINGE | INTRAVENOUS | Status: DC | PRN
  Filled 2017-12-28: qty 5

## 2017-12-28 NOTE — Progress Notes (Signed)
Susan English is a 27 y.o. G2P1001 at 5527w1d admitted for induction of labor due to Gestational diabetes (A1).  Subjective: S/p epidural.   Objective: BP 134/67   Pulse 76   Temp 98.1 F (36.7 C) (Oral)   Resp 16   Ht 5\' 8"  (1.727 m)   Wt 128.8 kg   LMP 02/22/2017 (Approximate)   SpO2 100%   BMI 43.18 kg/m  No intake/output data recorded. No intake/output data recorded.  FHT:  FHR: 140 bpm, variability: moderate,  accelerations:  Present,  decelerations:  Absent UC:   irregular, every 3-7 minutes SVE:   Dilation: 5 Effacement (%): 80 Station: -2 Exam by:: Campbell [employer]S Earl RN  Labs: Lab Results  Component Value Date   WBC 12.8 (H) 12/27/2017   HGB 11.0 (L) 12/27/2017   HCT 33.2 (L) 12/27/2017   MCV 88.1 12/27/2017   PLT 226 12/27/2017    Assessment / Plan: Induction of labor due to A1GDM,  progressing well on pitocin IUPC placed to facilitate Pitocin titration Labor: Progressing normally Preeclampsia:  N/A Fetal Wellbeing:  Category I Pain Control:  Epidural I/D:  n/a Anticipated MOD:  NSVD  Calvert CantorSamantha C Weinhold 12/28/2017, 1500

## 2017-12-28 NOTE — Anesthesia Procedure Notes (Signed)
Epidural Patient location during procedure: OB Start time: 12/28/2017 10:33 AM End time: 12/28/2017 10:37 AM  Staffing Anesthesiologist: Leilani AbleHatchett, Tameeka Luo, MD Performed: anesthesiologist   Preanesthetic Checklist Completed: patient identified, site marked, surgical consent, pre-op evaluation, timeout performed, IV checked, risks and benefits discussed and monitors and equipment checked  Epidural Patient position: sitting Prep: site prepped and draped and DuraPrep Patient monitoring: continuous pulse ox and blood pressure Approach: midline Location: L3-L4 Injection technique: LOR air  Needle:  Needle type: Tuohy  Needle gauge: 17 G Needle length: 9 cm and 9 Needle insertion depth: 7 cm Catheter type: closed end flexible Catheter size: 19 Gauge Catheter at skin depth: 12 cm Test dose: negative and Other  Assessment Sensory level: T9 Events: blood not aspirated, injection not painful, no injection resistance, negative IV test and no paresthesia  Additional Notes Reason for block:procedure for pain

## 2017-12-28 NOTE — Progress Notes (Signed)
Susan English is a 27 y.o. G2P1001 at 8118w1d, IOL for A1GDM  Subjective: S/p epidural, sleeping through contractions  Objective: BP 91/75   Pulse 82   Temp 97.9 F (36.6 C) (Oral)   Resp 16   Ht 5\' 8"  (1.727 m)   Wt 128.8 kg   LMP 02/22/2017 (Approximate)   SpO2 100%   BMI 43.18 kg/m  No intake/output data recorded. No intake/output data recorded.  FHT:  FHR: 130 bpm, variability: moderate,  accelerations:  Present,  decelerations:  Absent UC:   irregular, every 3-5 minutes SVE:   Dilation: 5 Effacement (%): 60 Station: -3 Exam by:: [employer] DonnelleyWeinhold CNM  Labs: Lab Results  Component Value Date   WBC 12.8 (H) 12/27/2017   HGB 11.0 (L) 12/27/2017   HCT 33.2 (L) 12/27/2017   MCV 88.1 12/27/2017   PLT 226 12/27/2017    Assessment / Plan: Induction of labor due to gestational diabetes,  progressing well on pitocin Fetal Wellbeing:  Category I Pain Control:  Epidural I/D:  n/a Anticipated MOD:  NSVD   AROM now (moderate, clear fluid), IUPC placed  Samantha C Weinhold,CNM 12/28/2017, 1330

## 2017-12-28 NOTE — Progress Notes (Signed)
LABOR PROGRESS NOTE  Susan English is a 27 y.o. G2P1001 at 6562w0d  admitted for IOL for A1GDM. Patient is TOLAC. Previous CS for failure to progress.  Subjective: Continues to be uncomfortable with ctx. Nitrous oxide in use.   Objective: BP 133/80   Pulse 85   Temp 97.9 F (36.6 C) (Oral)   Resp 20   Ht 5\' 8"  (1.727 m)   Wt 128.8 kg   LMP 02/22/2017 (Approximate)   BMI 43.18 kg/m  or  Vitals:   12/27/17 2342 12/28/17 0004 12/28/17 0104 12/28/17 0159  BP: 124/80 131/71 (!) 147/67 133/80  Pulse: 87 80 87 85  Resp: 18 18 18 20   Temp:   97.9 F (36.6 C)   TempSrc:   Oral   Weight:      Height:         Dilation: 4 Effacement (%): 50 Cervical Position: Posterior Station: -3 Presentation: Vertex Exam by:: Dr Aggie HackerWallace FHT: baseline rate 140, moderate varibility, +acel, no decel Toco: difficult to trace, q3-4 min   Labs: Lab Results  Component Value Date   WBC 12.8 (H) 12/27/2017   HGB 11.0 (L) 12/27/2017   HCT 33.2 (L) 12/27/2017   MCV 88.1 12/27/2017   PLT 226 12/27/2017    Patient Active Problem List   Diagnosis Date Noted  . GDM, class A1 12/27/2017  . Gestational diabetes 11/29/2017  . Acute pain of right knee 11/05/2017  . Previous cesarean delivery affecting pregnancy, antepartum 10/08/2017  . Obesity complicating pregnancy in second trimester   . Tobacco smoking complicating pregnancy in second trimester   . Status post cryotherapy of skin lesion 06/11/2017  . Smoker 06/11/2017  . Supervision of other normal pregnancy, antepartum 06/10/2017  . Previous cesarean delivery, antepartum condition or complication 06/10/2017  . HSIL (high grade squamous intraepithelial lesion) on Pap smear of cervix 05/13/2016    Assessment / Plan: 27 y.o. G2P1001 at 5497w1d here for IOL for A1GDM. Most recent CBG 73.   Labor: TOLAC. Patient s/p FB. On Pitocin, difficult to titrate due to difficultly tracing ctx with toco. Cervical exam remained unchanged. Will hold off on  AROM at present. Plan to eventually rupture and place IUPC.   Fetal Wellbeing:  Cat I  Pain Control:  Nitrous oxide. Epidural upon request.  Anticipated MOD:  NSVD   Marcy Sirenatherine Wallace, D.O. OB Fellow  12/28/2017, 2:21 AM

## 2017-12-28 NOTE — Progress Notes (Signed)
Susan English is a 27 y.o. G2P1001 at 7777w1d admitted for induction of labor due to A1GDM.  Subjective: Audibly uncomfortable during contractions. Utilizing nitrous, sitting on birthing ball.   Objective: BP (!) 137/95   Pulse 77   Temp 98.1 F (36.7 C) (Oral)   Resp 20   Ht 5\' 8"  (1.727 m)   Wt 128.8 kg   LMP 02/22/2017 (Approximate)   BMI 43.18 kg/m  No intake/output data recorded. No intake/output data recorded.  FHT:  FHR: 135 bpm, variability: moderate,  accelerations:  Present,  decelerations:  Absent UC:   irregular, every 3-7 minutes SVE:   Dilation: 4 Effacement (%): 60 Station: -3 Exam by:: Jabil [employer]S Moyer RN  Labs: Lab Results  Component Value Date   WBC 12.8 (H) 12/27/2017   HGB 11.0 (L) 12/27/2017   HCT 33.2 (L) 12/27/2017   MCV 88.1 12/27/2017   PLT 226 12/27/2017    Assessment / Plan: Induction of labor due to A1GDM,  progressing well on pitocin   Labor: No cervical change from previous exams Fetal Wellbeing:  Category I Pain Control:  Nitrous Oxide I/D:  n/a Anticipated MOD:  NSVD   Discussed lack of cervical change with patient. Reviewed impact of adequate pain management on cervical dilation. Encouraged patient to consider epidural to facilitate nap and improved coping. Patient plans to stay on ball for another 15 min then request epidural.  Susan English, CNM 12/28/2017, 10:06 AM

## 2017-12-28 NOTE — Progress Notes (Signed)
OB/GYN Faculty Practice: Labor Progress Note  Subjective: Doing well. Epidural in place. Family/friends in room. Feels comfortable but does feel pressure with contractions.   Objective: BP 117/74   Pulse (!) 104   Temp 97.8 F (36.6 C) (Oral)   Resp 16   Ht 5\' 8"  (1.727 m)   Wt 128.8 kg   LMP 02/22/2017 (Approximate)   SpO2 100%   BMI 43.18 kg/m  Gen: well-appearing, NAD  Dilation: 7 Effacement (%): 80 Cervical Position: Posterior Station: 0 Presentation: Vertex Exam by:: Dr. Earlene PlaterWallace  Assessment and Plan: 27 y.o. G2P1001 6556w1d here for IOL for A1GDM.   Labor: Induction. TOLAC (failure to progress).  -- s/p FB -- AROM clear 1645  -- IUPC in place, titrating pitocin - will titrate to adequacy now that 7cm  -- pain control: epidural in place  Fetal Well-Being: EFW 2411g (83%) at 32weeks. 8lbs by Leopold's. Cephalic by sutures.  -- Category I - continuous fetal monitoring (FSE replaced)  -- GBS (-)   A1GDM: BG controlled, last 99.   Susan DeerLaurel S. Earlene PlaterWallace, DO OB/GYN Fellow, Faculty Practice  10:17 PM

## 2017-12-28 NOTE — Progress Notes (Addendum)
Hypoglycemic Event  CBG: 58  Treatment: 15 GM carbohydrate snack  Symptoms: None  Follow-up CBG: Time:1415 CBG Result: 61  Possible Reasons for Event: Other: Pt clear liquid diet for labor  Comments/MD notified: Weinhold CNM notified of CBG and interventions at 1400. RN to recheck CBG at 1415 and continue to monitor.    Jacinto ReapSarah K Taneia Mealor

## 2017-12-28 NOTE — Progress Notes (Signed)
HYPOGLYCEMIA STANDING ORDERS FOR PREGNANT ADULTS WITH DIABETES   When hypoglycemia is suspected, obtain a STAT CBG: weakness, change in behavior/mental status, sweating, hunger, headache, shakiness, anxiety, nightmares, numbness, tingling, irritability, tachycardia, or seizures.   The RN shall initiate emergency measures immediately when either a routine or STAT CBG indicates hypoglycemia. After implementation, the nurse will order Hypoglycemia Standing Orders for Pregnant Adults with Diabetes in the electronic medical record and check all actions taken.  The physician/physician extender will sign the order document within 24 hours after implementation of emergency measures.    Mild or Moderate Hypoglycemia  CBG is less than 60 mg/dl;   A.  Patient taking PO's  Give one of the 15 gram CHO options:    1.  3 Glucose tablets  4.   4 oz (1/2 cup) regular soda*    2.  1 tube instant glucose 5.   8 oz (1 cup) skim milk    3.  4 oz (1/2 cup) juice     *NOTE: for ESRD patient: Use  cup Sprite or Ginger-ale   Recheck CBG in 15 minutes.   If CBG <60 mg/dl, repeat treatment until hypoglycemia is resolved.   If CBG >60 mg/dl and next meal is more than 1 hours away, give additional 15 grams of Carbohydrates (examples are above).   Continue to check and treat CBG every 15 minutes until hypoglycemia resolved.   B.  Patient NPO   If IV access, give  amp of D50 (equivalent to 15 grams of Carbs)   If unable to gain IV access, Give Glucagon IM     1 mg if patient weighs more than 45.5 kg     0.5 mg if patient weighs less than 45.5 kg   Recheck CBG in 15 minutes. If CBG is less than 60 mg/dl, repeat treatment.   Continue to check and treat CBG every 15 minutes until hypoglycemia is resolved.   Call MD for further orders.    C.  If patient is on GlucoStabilizer and has CBG <60 mg/dl, please follow directions provided by [employer]

## 2017-12-28 NOTE — Anesthesia Preprocedure Evaluation (Signed)
Anesthesia Evaluation  Patient identified by MRN, date of birth, ID band Patient awake    Reviewed: Allergy & Precautions, H&P , NPO status , Patient's Chart, lab work & pertinent test results  Airway Mallampati: II  TM Distance: >3 FB Neck ROM: full    Dental no notable dental hx. (+) Teeth Intact   Pulmonary former smoker,    Pulmonary exam normal breath sounds clear to auscultation       Cardiovascular negative cardio ROS Normal cardiovascular exam Rhythm:regular Rate:Normal     Neuro/Psych negative psych ROS   GI/Hepatic negative GI ROS, Neg liver ROS,   Endo/Other  diabetes, GestationalMorbid obesity  Renal/GU negative Renal ROS  negative genitourinary   Musculoskeletal   Abdominal (+) + obese,   Peds  Hematology negative hematology ROS (+)   Anesthesia Other Findings   Reproductive/Obstetrics (+) Pregnancy                             Anesthesia Physical Anesthesia Plan  ASA: III  Anesthesia Plan: Epidural   Post-op Pain Management:    Induction:   PONV Risk Score and Plan:   Airway Management Planned:   Additional Equipment:   Intra-op Plan:   Post-operative Plan:   Informed Consent: I have reviewed the patients History and Physical, chart, labs and discussed the procedure including the risks, benefits and alternatives for the proposed anesthesia with the patient or authorized representative who has indicated his/her understanding and acceptance.     Plan Discussed with:   Anesthesia Plan Comments:         Anesthesia Quick Evaluation

## 2017-12-29 ENCOUNTER — Encounter (HOSPITAL_COMMUNITY): Payer: Self-pay

## 2017-12-29 DIAGNOSIS — O24429 Gestational diabetes mellitus in childbirth, unspecified control: Secondary | ICD-10-CM

## 2017-12-29 DIAGNOSIS — Z3A40 40 weeks gestation of pregnancy: Secondary | ICD-10-CM

## 2017-12-29 LAB — GLUCOSE, CAPILLARY: Glucose-Capillary: 101 mg/dL — ABNORMAL HIGH (ref 70–99)

## 2017-12-29 MED ORDER — OXYCODONE HCL 5 MG PO TABS
5.0000 mg | ORAL_TABLET | ORAL | Status: DC | PRN
  Administered 2017-12-29 – 2017-12-30 (×4): 5 mg via ORAL
  Filled 2017-12-29 (×4): qty 1

## 2017-12-29 MED ORDER — BENZOCAINE-MENTHOL 20-0.5 % EX AERO
1.0000 "application " | INHALATION_SPRAY | CUTANEOUS | Status: DC | PRN
  Administered 2017-12-29: 1 via TOPICAL
  Filled 2017-12-29: qty 56

## 2017-12-29 MED ORDER — SIMETHICONE 80 MG PO CHEW
80.0000 mg | CHEWABLE_TABLET | ORAL | Status: DC | PRN
  Administered 2017-12-30: 80 mg via ORAL
  Filled 2017-12-29: qty 1

## 2017-12-29 MED ORDER — DIBUCAINE 1 % RE OINT
1.0000 "application " | TOPICAL_OINTMENT | RECTAL | Status: DC | PRN
  Administered 2017-12-30: 1 via RECTAL
  Filled 2017-12-29: qty 28

## 2017-12-29 MED ORDER — DIPHENHYDRAMINE HCL 25 MG PO CAPS: 25.0000 mg | ORAL_CAPSULE | Freq: Four times a day (QID) | ORAL | Status: DC | PRN

## 2017-12-29 MED ORDER — ONDANSETRON HCL 4 MG PO TABS: 4.0000 mg | ORAL_TABLET | ORAL | Status: DC | PRN

## 2017-12-29 MED ORDER — ONDANSETRON HCL 4 MG/2ML IJ SOLN: 4.0000 mg | INTRAMUSCULAR | Status: DC | PRN

## 2017-12-29 MED ORDER — SENNOSIDES-DOCUSATE SODIUM 8.6-50 MG PO TABS
2.0000 | ORAL_TABLET | ORAL | Status: DC
  Administered 2017-12-29 – 2017-12-30 (×2): 2 via ORAL
  Filled 2017-12-29 (×2): qty 2

## 2017-12-29 MED ORDER — WITCH HAZEL-GLYCERIN EX PADS: 1.0000 "application " | MEDICATED_PAD | CUTANEOUS | Status: DC | PRN

## 2017-12-29 MED ORDER — COCONUT OIL OIL
1.0000 "application " | TOPICAL_OIL | Status: DC | PRN
  Administered 2017-12-30: 1 via TOPICAL
  Filled 2017-12-29: qty 120

## 2017-12-29 MED ORDER — IBUPROFEN 600 MG PO TABS
600.0000 mg | ORAL_TABLET | Freq: Four times a day (QID) | ORAL | Status: DC
  Administered 2017-12-29 – 2017-12-30 (×5): 600 mg via ORAL
  Filled 2017-12-29 (×5): qty 1

## 2017-12-29 MED ORDER — ACETAMINOPHEN 325 MG PO TABS
650.0000 mg | ORAL_TABLET | ORAL | Status: DC | PRN
  Administered 2017-12-29 – 2017-12-30 (×6): 650 mg via ORAL
  Filled 2017-12-29 (×6): qty 2

## 2017-12-29 MED ORDER — ZOLPIDEM TARTRATE 5 MG PO TABS: 5.0000 mg | ORAL_TABLET | Freq: Every evening | ORAL | Status: DC | PRN

## 2017-12-29 MED ORDER — PRENATAL MULTIVITAMIN CH
1.0000 | ORAL_TABLET | Freq: Every day | ORAL | Status: DC
  Administered 2017-12-29 – 2017-12-30 (×2): 1 via ORAL
  Filled 2017-12-29 (×2): qty 1

## 2017-12-29 NOTE — Lactation Note (Signed)
This note was copied from a baby'English chart. Lactation Consultation Note  Patient Name: Susan English WGNFA'OToday'English Date: 12/29/2017 Reason for consult: Initial assessment;Term Breastfeeding consultation services and support information given and reviewed.  Mom states she breastfed her first 8 years ago for 11 days but then developed a breast infection.  Mom has baby latched well to right breast in football hold.  Baby actively feeding with swallows noted.  Instructed on gentle infant stimulation and breast massage during feeding.  Encouraged to feed with cues and call out for assist prn.  Maternal Data Does the patient have breastfeeding experience prior to this delivery?: Yes  Feeding Feeding Type: Breast Fed  LATCH Score Latch: Grasps breast easily, tongue down, lips flanged, rhythmical sucking.  Audible Swallowing: A few with stimulation  Type of Nipple: Everted at rest and after stimulation  Comfort (Breast/Nipple): Soft / non-tender  Hold (Positioning): No assistance needed to correctly position infant at breast.  LATCH Score: 9  Interventions    Lactation Tools Discussed/Used     Consult Status Consult Status: Follow-up Date: 12/30/17 Follow-up type: In-patient    Huston FoleyMOULDEN, Susan English 12/29/2017, 2:48 PM

## 2017-12-29 NOTE — Anesthesia Postprocedure Evaluation (Signed)
Anesthesia Post Note  Patient: Deirdre Evenershley C Liby  Procedure(s) Performed: AN AD HOC LABOR EPIDURAL     Patient location during evaluation: Mother Baby Anesthesia Type: Epidural Level of consciousness: awake and alert Pain management: pain level controlled Vital Signs Assessment: post-procedure vital signs reviewed and stable Respiratory status: spontaneous breathing, nonlabored ventilation and respiratory function stable Cardiovascular status: stable Postop Assessment: no headache, no backache, epidural receding, no apparent nausea or vomiting, patient able to bend at knees, adequate PO intake and able to ambulate Anesthetic complications: no    Last Vitals:  Vitals:   12/29/17 0446 12/29/17 0551  BP: 112/81 127/71  Pulse: (!) 102 92  Resp:    Temp: 36.7 C 36.9 C  SpO2:      Last Pain:  Vitals:   12/29/17 0551  TempSrc: Oral  PainSc:    Pain Goal: Patients Stated Pain Goal: 5 (12/29/17 0030)               Laban EmperorMalinova,Glyndon Tursi Hristova

## 2017-12-29 NOTE — Progress Notes (Signed)
OB/GYN Faculty Practice: Labor Progress Note  Subjective: Called to room around 0030 when patient was feeling more pressure. Had recently been checked by nurse. Repositioned and now feeling less pressure.   Objective: BP (!) 126/58   Pulse 96   Temp 98.7 F (37.1 C) (Oral)   Resp 16   Ht 5\' 8"  (1.727 m)   Wt 128.8 kg   LMP 02/22/2017 (Approximate)   SpO2 100%   BMI 43.18 kg/m  Gen: well-appearing, NAD  Dilation: 8 Effacement (%): 80 Cervical Position: Posterior Station: 0 Presentation: Vertex Exam by:: Paris Lore Sullivan RN  Assessment and Plan: 27 y.o. G2P1001 3221w1d here for IOL for A1GDM.   Labor: Induction. TOLAC (failure to progress).  -- s/p FB -- AROM clear 1645  -- IUPC in place, titrating pitocin - now on 30 pitocin, will not increase as TOLAC -- pain control: epidural in place  Fetal Well-Being: EFW 2411g (83%) at 32weeks. 8lbs by Leopold's. Cephalic by sutures.  -- Category I - continuous fetal monitoring (FSE in place)   -- GBS (-)   A1GDM: BG controlled, last 101  Donni Oglesby S. Earlene PlaterWallace, DO OB/GYN Fellow, Faculty Practice  1:44 AM

## 2017-12-30 LAB — GLUCOSE, CAPILLARY: Glucose-Capillary: 102 mg/dL — ABNORMAL HIGH (ref 70–99)

## 2017-12-30 MED ORDER — KETOROLAC TROMETHAMINE 10 MG PO TABS
10.0000 mg | ORAL_TABLET | Freq: Four times a day (QID) | ORAL | Status: AC
  Administered 2017-12-30 (×3): 10 mg via ORAL
  Filled 2017-12-30 (×4): qty 1

## 2017-12-30 NOTE — Progress Notes (Addendum)
Post Partum Day 1 Subjective: c/o of 6/10 pain despite q6prn oxy5, also c/o of difficulty stopping urine midstream and increased urgency.  She thinks she is bleeding severely  Objective: Blood pressure 130/84, pulse 90, temperature 98.1 F (36.7 C), temperature source Oral, resp. rate 18, height 5\' 8"  (1.727 m), weight 128.8 kg, last menstrual period 02/22/2017, SpO2 100 %, unknown if currently breastfeeding.  Physical Exam:  General: cooperative, appears stated age, fatigued, no distress and morbidly obese Lochia: appropriate Uterine Fundus: firm Incision: n/a  DVT Evaluation: No evidence of DVT seen on physical exam.  Recent Labs    12/27/17 0853  HGB 11.0*  HCT 33.2*    Assessment/Plan: Plan for discharge tomorrow   Per nursing has been somewhat sedated s/p oxy doses.  Do not intend to add more opiods. Nurse to ambulate patient today We disussed urinary symptoms and she will follow symptoms and if they persist she can talk to pcp   LOS: 3 days   Marthenia RollingScott Bland 12/30/2017, 6:04 AM   I confirm that I have verified the information documented in the resident's note and that I have also personally performed the physical exam and all medical decision making activities.  Clayton BiblesSamantha Weinhold, [place] 12/30/17  5:48 PM

## 2017-12-30 NOTE — Lactation Note (Addendum)
This note was copied from a baby's chart. Lactation Consultation Note  Patient Name: Susan English OZHYQ'MToday's Date: 12/30/2017 Reason for consult: Follow-up assessment   P2, Baby 33 hours old and mother requesting lactation assistance. Mother tearful upon entering room.  Reassured mother. Mother states her nipples are sore.  No cracks or abrasions visible.  She stopped breastfeeding with her first child due to soreness. Reviewed hand expression with drops expressed.  Provided a manual pump. Mom has my # to call for assist w/next feeding. Baby sleeping.  Suggest mother take a nap and call when baby cues.   Mother called for latch assistance. Mother hand expressed drops and prepumped before latching. Assisted w/ latching baby in football hold with depth and used compression to keep baby active. Taught mother how to [place] and relatch for more depth if needed. Mother to apply ebm or coconut oil after breastfeeding.     Maternal Data    Feeding Feeding Type: Breast Fed Length of feed: 0 min  LATCH Score                   Interventions    Lactation Tools Discussed/Used     Consult Status Consult Status: Follow-up Date: 12/30/17 Follow-up type: In-patient    [name], [name] [facility] 12/30/2017, 12:19 PM

## 2017-12-30 NOTE — Progress Notes (Signed)
CSW received consult due to score of 8 and a response of 1 on question 10 on the Edinburgh Depression Screen.    When CSW arrived, MOB was in bed bonding with infant as evidence by engaging in skin to skin. MOB became tearful shortly after CSW entered and expressed feeling of frustration regarding breastfeeding. MOB shared that MOB did not have a good experience with breastfeeding her other child and she does not want to have the same experience the same thing with infant.  CSW agreed to speak with bedside nurse about assisting MOB.   CSW reviewed MOB's results of the [place] and MOB communicated that MOB misread questions 10. CSW assessed for safety and MOB denied SI, HI, and DV.  MOB reported having a good support team and feeling prepared to parent.   CSW provided education regarding Baby Blues vs PMADs and provided MOB with resources for mental health follow up.  CSW encouraged MOB to evaluate her mental health throughout the postpartum period with the use of the New Mom Checklist developed by Postpartum Progress as well as the [place] Postnatal Depression Scale and notify a medical professional if symptoms arise.  CSW offered MOB resources for outpatient counseling and MOB was receptive. CSW provided MOB with a mental health resource sheet.   There are no barriers to discharge.   Blaine HamperAngel Boyd-Gilyard, MSW, LCSW Clinical Social Work [phone]

## 2017-12-31 MED ORDER — ACETAMINOPHEN 325 MG PO TABS: 650.0000 mg | ORAL_TABLET | ORAL | 0 refills | Status: AC | PRN

## 2017-12-31 NOTE — Lactation Note (Signed)
This note was copied from a baby's chart. Lactation Consultation Note  Patient Name: Susan English AYTKZ'SToday's Date: 12/31/2017 Reason for consult: Follow-up assessment;Infant weight loss   P1.  Baby 54 hours old.  10.4% weight loss.  2 voids/3 stools in the last 24 hours. Assisted mother w/ latching baby deep on breast and compressing breast during feeding. Observed feeding for 20 min.  Mother needed help with depth, unlatching and keeping baby active and longer feeds. Set up DEBP. Recommend mother post pump 4-6 times per day for 10-20 min with DEBP on initiation setting. Give baby back volume pumped at the next feeding. Reviewed cleaning and milk storage.  Mother pumped drops.  Supplemented per MD with 22 ml of gerber formula via curved tip syringe. Encouraged breastfeeding on both breasts before offering formula. Mom encouraged to feed baby 8-12 times/24 hours and with feeding cues.  Reviewed engorgement care and monitoring voids/stools. Mother has manual pump and personal DEBP at home.     Maternal Data Has patient been taught Hand Expression?: Yes  Feeding Feeding Type: Breast Fed  LATCH Score Latch: Grasps breast easily, tongue down, lips flanged, rhythmical sucking.  Audible Swallowing: A few with stimulation  Type of Nipple: Everted at rest and after stimulation  Comfort (Breast/Nipple): Filling, red/small blisters or bruises, mild/mod discomfort  Hold (Positioning): Assistance needed to correctly position infant at breast and maintain latch.  LATCH Score: 7  Interventions Interventions: Breast feeding basics reviewed;Assisted with latch;Skin to skin;Hand express;Breast compression;Adjust position;Expressed milk;DEBP;Coconut oil  Lactation Tools Discussed/Used Pump Review: Setup, frequency, and cleaning;Milk Storage Initiated by:: Dahlia Byesuth Berkelhammer RN IBCLC Date initiated:: 12/31/17   Consult Status Consult Status: Follow-up Date: 12/31/17 Follow-up type:  In-patient    [name], [facility] 12/31/2017, 9:13 AM

## 2017-12-31 NOTE — Discharge Summary (Addendum)
OB Discharge Summary     Patient Name: Susan English DOB: Apr 17, 1991 MRN: [id number]  Date of admission: 12/27/2017 Delivering MD: Sherene Sires   Date of discharge: 12/31/2017  Admitting diagnosis: INDUCTION Intrauterine pregnancy: [redacted]w[redacted]d    Secondary diagnosis:  Active Problems:   GDM, class A1   Vaginal delivery  Additional problems:      Discharge diagnosis: Term Pregnancy Delivered                                                                                                Post partum procedures:,  Augmentation: Pitocin  [facility]  Induction of Labor With Vaginal Delivery   27y.o. yo G2P2002 at 461w2das admitted to the hospital 12/27/2017 for induction of labor.  Indication for induction: A1 DM.  Patient had an uncomplicated labor course as follows: Membrane Rupture Time/Date: 1:45 PM ,12/28/2017   Intrapartum Procedures: Episiotomy: None [1]                                         Lacerations:  1st degree [2]  Patient had delivery of a Viable infant.  Information for the patient's newborn:  MoRemmi, Armenteros0[[id number]]Delivery Method: VBAC, Spontaneous(Filed from Delivery Summary)   12/29/2017  Details of delivery can be found in separate delivery note.  Patient had a routine postpartum course. Patient is discharged home 12/31/17.  Physical exam  Vitals:   12/30/17 0540 12/30/17 1538 12/30/17 2135 12/31/17 0500  BP: 130/84 (!) 128/96 134/88 132/79  Pulse: 90 80 85 76  Resp: '18 18 18   '$ Temp: 98.1 F (36.7 C) 98.2 F (36.8 C) 98 F (36.7 C) 98.1 F (36.7 C)  TempSrc: Oral Oral Oral Oral  SpO2: 100%     Weight:      Height:       General: alert, cooperative and no distress Lochia: appropriate Uterine Fundus: firm Incision: N/A DVT Evaluation: No evidence of DVT seen on physical exam. Labs: Lab Results  Component Value Date   WBC 12.8 (H) 12/27/2017   HGB 11.0 (L) 12/27/2017   HCT 33.2 (L) 12/27/2017   MCV 88.1  12/27/2017   PLT 226 12/27/2017   CMP Latest Ref Rng & Units 01/08/2017  Glucose 65 - 99 mg/dL 96  BUN 6 - 20 mg/dL 13  Creatinine 0.44 - 1.00 mg/dL 0.75  Sodium 135 - 145 mmol/L 141  Potassium 3.5 - 5.1 mmol/L 3.8  Chloride 101 - 111 mmol/L 107  CO2 22 - 32 mmol/L 27  Calcium 8.9 - 10.3 mg/dL 9.1  Total Protein 6.5 - 8.1 g/dL 7.5  Total Bilirubin 0.3 - 1.2 mg/dL 0.5  Alkaline Phos 38 - 126 U/L 73  AST 15 - 41 U/L 15  ALT 14 - 54 U/L 13(L)    Discharge instruction: per After Visit Summary and "Baby and Me Booklet".  After visit meds:  Allergies as of 12/31/2017      Reactions  Latex Hives      Medication List    STOP taking these medications   terconazole 0.4 % vaginal cream Commonly known as:  TERAZOL 7     TAKE these medications   ACCU-CHEK FASTCLIX LANCETS Misc 1 Device by Does not apply route 4 (four) times daily.   acetaminophen 325 MG tablet Commonly known as:  TYLENOL Take 2 tablets (650 mg total) by mouth every 4 (four) hours as needed for up to 15 days (for pain scale < 4).   cyclobenzaprine 5 MG tablet Commonly known as:  FLEXERIL Take 1 tablet (5 mg total) by mouth every 8 (eight) hours as needed for muscle spasms.   diphenhydrAMINE 25 MG tablet Commonly known as:  BENADRYL Take 25 mg by mouth daily.   glucose blood test strip Use as instructed QID   HYDROcodone-acetaminophen 5-325 MG tablet Commonly known as:  NORCO/VICODIN Take 1 tablet by mouth every 4 (four) hours as needed for moderate pain.   hydrOXYzine 10 MG tablet Commonly known as:  ATARAX/VISTARIL Take 1 tablet (10 mg total) by mouth 3 (three) times daily as needed. What changed:  reasons to take this   prenatal vitamin w/FE, FA 27-1 MG Tabs tablet Take 1 tablet by mouth daily at 12 noon.       Diet: routine diet  Activity: Advance as tolerated. Pelvic rest for 6 weeks.   Outpatient follow up:Marland Kitchen Follow up Appt: Future Appointments  Date Time Provider [place]   02/08/2018  8:20 AM [facility]  02/08/2018  8:55 AM Tre[place] Mall, CNM [facility]   Follow up Visit:No follow-ups on file.  Postpartum contraception: Nexplanon (outpatient)  Newborn Data: Live born female  Birth Weight: 8 lb 12.9 oz (3994 g) APGAR: 59, 9  Newborn Delivery   Birth date/time:  12/29/2017 02:38:00 Delivery type:  VBAC, Spontaneous     Baby Feeding: Breast Disposition:home with mother   12/31/2017 Sherene Sires, DO   I have spoken with and examined this patient and agree with resident/PA-S/Med-S/SNM's note and plan of care. VSS, HRR&R, Resp unlabored, Legs neg.  Nigel Berthold, CNM 01/04/2018 2:53 PM

## 2018-02-08 ENCOUNTER — Other Ambulatory Visit: Payer: Medicaid Other

## 2018-02-08 ENCOUNTER — Other Ambulatory Visit: Payer: Self-pay | Admitting: General Practice

## 2018-02-08 ENCOUNTER — Encounter: Payer: Self-pay | Admitting: Advanced Practice Midwife

## 2018-02-08 ENCOUNTER — Ambulatory Visit (INDEPENDENT_AMBULATORY_CARE_PROVIDER_SITE_OTHER): Payer: Medicaid Other | Admitting: Advanced Practice Midwife

## 2018-02-08 DIAGNOSIS — Z3046 Encounter for surveillance of implantable subdermal contraceptive: Secondary | ICD-10-CM

## 2018-02-08 DIAGNOSIS — Z1389 Encounter for screening for other disorder: Secondary | ICD-10-CM

## 2018-02-08 DIAGNOSIS — O24410 Gestational diabetes mellitus in pregnancy, diet controlled: Secondary | ICD-10-CM

## 2018-02-08 LAB — POCT PREGNANCY, URINE: PREG TEST UR: NEGATIVE

## 2018-02-08 MED ORDER — ETONOGESTREL 68 MG ~~LOC~~ IMPL
68.0000 mg | DRUG_IMPLANT | Freq: Once | SUBCUTANEOUS | Status: AC
  Administered 2018-02-08: 68 mg via SUBCUTANEOUS

## 2018-02-08 NOTE — Progress Notes (Signed)
Subjective:     Susan English is a 27 y.o. female who presents for a postpartum visit. She is 6 weeks postpartum following a spontaneous vaginal delivery. I have fully reviewed the prenatal and intrapartum course. The delivery was at 40.2 gestational weeks. Outcome: spontaneous vaginal delivery. Anesthesia: epidural. Postpartum course has been unremarkable. Baby's course has been Unremarkable. Baby is feeding by both breast and bottle - Enfamil AR. Bleeding staining only. Bowel function is normal. Bladder function is normal. Patient is not sexually active. Contraception method is none at this time, but desires Nexplanon today. Postpartum depression screening: negative.  The following portions of the patient's history were reviewed and updated as appropriate: allergies, current medications, past family history, past medical history, past social history, past surgical history and problem list.  Review of Systems Pertinent items are noted in HPI.   Objective:    LMP 02/22/2017 (Approximate)   General:  alert, cooperative and no distress   Breasts:  negative  Lungs: clear to auscultation bilaterally  Heart:  regular rate and rhythm, S1, S2 normal, no murmur, click, rub or gallop  Abdomen: soft, non-tender; bowel sounds normal; no masses,  no organomegaly   Vulva:  not evaluated  Vagina: not evaluated  Cervix:  not examined   Corpus: not examined  Adnexa:  not evaluated  Rectal Exam: Not performed.         GYNECOLOGY OFFICE PROCEDURE NOTE  Susan English is a 27 y.o. [phone] here for  Nexplanon insertion.  Last pap smear was on 07/29/17 and was abnormal, due for re-pap in 6 months.  No other gynecologic concerns.  Nexplanon Insertion Procedure Patient identified, informed consent performed, consent signed.   Patient does understand that irregular bleeding is a very common side effect of this medication. She was advised to have backup contraception for one week after placement. Pregnancy test  in clinic today was negative.  Appropriate time out taken.  Patient's left arm was prepped and draped in the usual sterile fashion. The ruler used to measure and mark insertion area.  Patient was prepped with alcohol swab and then injected with 3 ml of 1% lidocaine.  She was prepped with betadine, Nexplanon removed from packaging,  Device confirmed in needle, then inserted full length of needle and withdrawn per handbook instructions. Nexplanon was able to palpated in the patient's arm; patient palpated the insert herself. There was minimal blood loss.  Patient insertion site covered with guaze and a pressure bandage to reduce any bruising.  The patient tolerated the procedure well and was given post procedure instructions.     Assessment:     Routine postpartum exam. Pap smear not done at today's visit.  Due for repeat pap in 6 months.   Plan:    1. Contraception: Nexplanon 2. Due for repeat pap in 6 months. 3. Follow up in: 6 months or as needed.

## 2018-02-08 NOTE — Patient Instructions (Signed)
Primary care follow up  [facility] (will see you even if you do not have sickle cell): [phone] [facility]: [phone] [facility]: [phone] [facility] [phone]   

## 2018-02-09 LAB — GLUCOSE TOLERANCE, 2 HOURS
GLUCOSE, 2 HOUR: 68 mg/dL (ref 65–139)
Glucose, GTT - Fasting: 105 mg/dL — ABNORMAL HIGH (ref 65–99)

## 2019-11-24 IMAGING — CR DG FINGER RING 2+V*L*
3 series · 3 of 3 positions shown · non-contrast
Comparison: None.

CLINICAL DATA: Fall with proximal phalangeal pain.

EXAM:
LEFT RING FINGER 2+V

[finger ap]
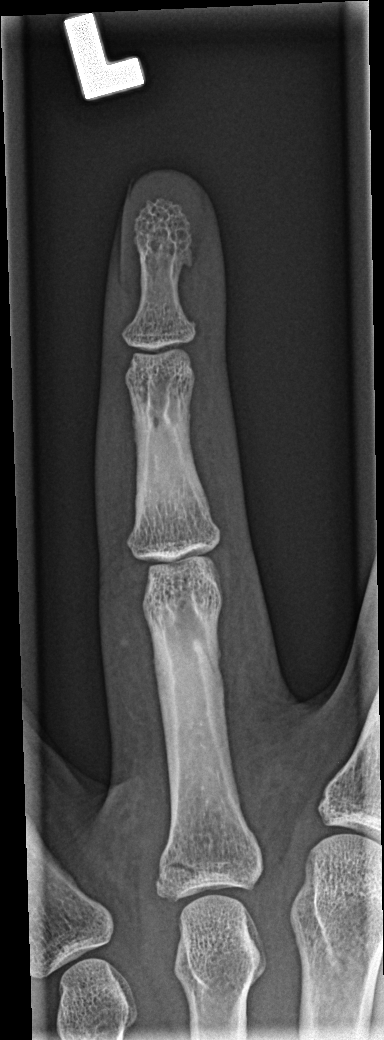

[finger obl]
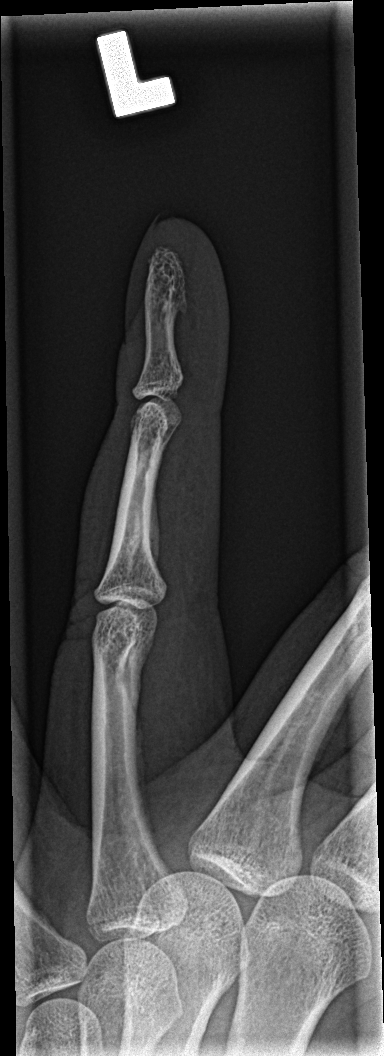

[finger lat]
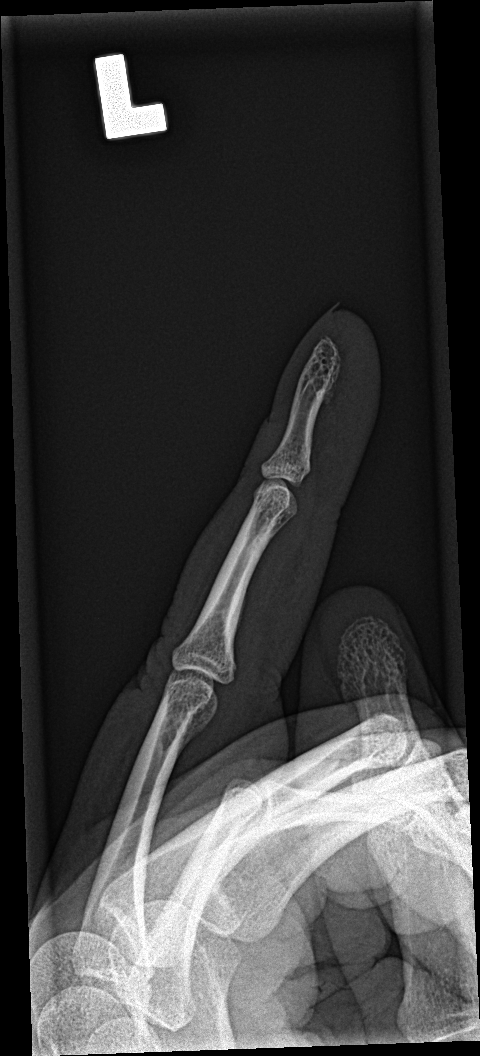

[3 of 3 positions shown; findings below may reference images not displayed]

FINDINGS: There is a minimally displaced intra-articular fracture of the base
of the proximal phalanx of left ring finger. No dislocation.
IMPRESSION: Minimally displaced, intra-articular fracture of the base of the
left ring finger proximal phalanx.

## 2020-07-18 ENCOUNTER — Other Ambulatory Visit: Payer: Self-pay

## 2020-07-18 ENCOUNTER — Encounter (HOSPITAL_COMMUNITY): Payer: Self-pay | Admitting: Emergency Medicine

## 2020-07-18 ENCOUNTER — Ambulatory Visit (HOSPITAL_COMMUNITY)
Admission: EM | Admit: 2020-07-18 | Discharge: 2020-07-18 | Disposition: A | Discharge status: Home / Self Care | Payer: Medicaid Other

## 2020-07-18 DIAGNOSIS — Z711 Person with feared health complaint in whom no diagnosis is made: Secondary | ICD-10-CM | POA: Diagnosis not present

## 2020-07-18 HISTORY — DX: Essential (primary) hypertension: I10

## 2020-07-18 NOTE — ED Triage Notes (Signed)
Patient was seen at a drug treatment center yesterday and was instructed to get seen for blood pressure readings , 138/91 was the lowest reading.  No headache recently.  Has had headaches in the past.  No chest pain

## 2020-07-18 NOTE — ED Provider Notes (Signed)
[facility]  ____________________________________________  Time seen: Approximately 9:25 AM  I have reviewed the triage vital signs and the nursing notes.   HISTORY  Chief Complaint Hypertension   Historian Patient    HPI Susan English is a 30 y.o. female presents to the urgent care with concern for possible hypertension.  Patient states that she was diagnosed with hypertension by her primary care provider and was started on hydrochlorothiazide and clonidine.  She states that her blood pressure has been around 140/90 at home.  She denies chest pain, chest tightness, shortness of breath, nausea, vomiting or abdominal pain.  Patient states that she does still get occasional headaches but none recently.  Patient states that she feels well taking her current blood pressure medications.   Past Medical History:  Diagnosis Date  . Headache(784.0)   . 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 Hypertension   . Obesity      Immunizations up to date:  Yes.     Past Medical History:  Diagnosis Date  . Headache(784.0)   . 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 Hypertension   . Obesity     Patient Active Problem List   Diagnosis Date Noted  . Successful VBAC 12/29/2017  . GDM, class A1 12/27/2017  . Gestational diabetes 11/29/2017  . Acute pain of right knee 11/05/2017  . Obesity complicating pregnancy in second trimester   . Tobacco smoking complicating pregnancy in second trimester   . Status post cryotherapy of skin lesion 06/11/2017  . Smoker 06/11/2017  . Supervision of other normal pregnancy, antepartum 06/10/2017  . Previous cesarean delivery, antepartum condition or complication 06/10/2017  . 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 CESAREAN SECTION    . ORIF ANKLE FRACTURE Right  12/22/2012   Procedure: OPEN REDUCTION INTERNAL FIXATION (ORIF) ANKLE FRACTURE;  Surgeon: Mable Paris, MD;  Location: [facility];  Service: Orthopedics;  Laterality: Right;  . sweat gland      Prior to Admission medications   Medication Sig Start Date End Date Taking? Authorizing Provider  hydrochlorothiazide (HYDRODIURIL) 25 MG tablet Take 50 mg by mouth every morning. 07/10/20  Yes [provider]  hydrOXYzine (VISTARIL) 50 MG capsule Take by mouth. 07/10/20  Yes [provider]  SUBOXONE 8-2 MG FILM Place under the tongue 4 (four) times daily. 07/10/20  Yes [provider]  traZODone (DESYREL) 50 MG tablet  07/18/20  Yes [provider]  ACCU-CHEK FASTCLIX LANCETS MISC 1 Device by Does not apply route 4 (four) times daily. Patient not taking: Reported on 02/08/2018 11/30/17   Thressa Sheller D, CNM  cloNIDine (CATAPRES) 0.1 MG tablet Take 0.1 mg by mouth at bedtime. 07/14/20   [provider]  cyclobenzaprine (FLEXERIL) 5 MG tablet Take 1 tablet (5 mg total) by mouth every 8 (eight) hours as needed for muscle spasms. Patient not taking: Reported on 02/08/2018 10/14/17   Aviva Signs, CNM  diphenhydrAMINE (BENADRYL) 25 MG tablet Take 25 mg by mouth daily.    [provider]  glucose blood (ACCU-CHEK GUIDE) test strip Use as instructed QID Patient not taking: Reported on 02/08/2018 11/30/17   Thressa Sheller D, CNM  HYDROcodone-acetaminophen (NORCO/VICODIN) 5-325 MG tablet Take 1 tablet by mouth every 4 (four) hours as needed for moderate pain.  [provider]  hydrOXYzine (ATARAX/VISTARIL) 10 MG tablet Take 1 tablet (10 mg total) by mouth 3 (three) times daily as needed. Patient not taking: Reported on 02/08/2018 10/22/17   Currie Paris, NP  prenatal vitamin w/FE, FA (PRENATAL 1 + 1) 27-1 MG TABS tablet Take 1 tablet by mouth daily at 12 noon. Patient not taking: Reported on 02/08/2018 05/27/17   Marylene Land, CNM    Allergies Latex  Family History  Problem Relation Age of Onset  . Hypertension Mother   . Diabetes Mother   . Diabetes Maternal Grandmother   . Breast cancer Paternal Grandmother     Social History Social History   Tobacco Use  . Smoking status: Former Smoker    Packs/day: 0.50  . Smokeless tobacco: Never Used  Substance Use Topics  . Alcohol use: No    Comment: occ  . Drug use: No     Review of Systems  Constitutional: No fever/chills Eyes:  No discharge ENT: No upper respiratory complaints. Respiratory: no cough. No SOB/ use of accessory muscles to breath Gastrointestinal:   No nausea, no vomiting.  No diarrhea.  No constipation. Musculoskeletal: Negative for musculoskeletal pain. Neuro: Patient has headache.  Skin: Negative for rash, abrasions, lacerations, ecchymosis.   ____________________________________________   PHYSICAL EXAM:  VITAL SIGNS: ED Triage Vitals  Enc Vitals Group     BP 07/18/20 0911 (!) 142/86     Pulse Rate 07/18/20 0911 78     Resp 07/18/20 0911 20     Temp 07/18/20 0911 99.1 F (37.3 C)     Temp Source 07/18/20 0911 Oral     SpO2 07/18/20 0911 99 %     Weight --      Height --      Head Circumference --      Peak Flow --      Pain Score 07/18/20 0908 0     Pain Loc --      Pain Edu? --      Excl. in GC? --      Constitutional: Alert and oriented. Well appearing and in no acute distress. Eyes: Conjunctivae are normal. PERRL. EOMI. Head: Atraumatic. ENT:      Nose: No congestion/rhinnorhea.      Mouth/Throat: Mucous membranes are moist.  Neck: No stridor.  FROM.  Cardiovascular: Normal rate, regular rhythm. Normal S1 and S2.  Good peripheral circulation. Respiratory: Normal respiratory effort without tachypnea or retractions. Lungs CTAB. Good air entry to the bases with no decreased or absent breath sounds Gastrointestinal: Bowel sounds x 4 quadrants. Soft and nontender to palpation. No guarding  or rigidity. No distention. Musculoskeletal: Full range of motion to all extremities. No obvious deformities noted Neurologic:  Normal for age. No gross focal neurologic deficits are appreciated.  Skin:  Skin is warm, dry and intact. No rash noted. Psychiatric: Mood and affect are normal for age. Speech and behavior are normal.   ____________________________________________   LABS (all labs ordered are listed, but only abnormal results are displayed)  Labs Reviewed - No data to display ____________________________________________  EKG   ____________________________________________  RADIOLOGY   No results found.  ____________________________________________    PROCEDURES  Procedure(s) performed:     Procedures     Medications - No data to display   ____________________________________________   INITIAL IMPRESSION / ASSESSMENT AND PLAN / ED COURSE  Pertinent labs & imaging results that were available during my care of the patient were reviewed by me  and considered in my medical decision making (see chart for details).      Assessment and plan Hypertension 30 year old female presents to the urgent care with concern for possible hypertension.  Patient's blood pressure was 142/86.  Recommended that she continue taking her prescribed clonidine and hydrochlorothiazide without medication changes.  Recommended continuing to monitor blood pressure at home.  Return precautions were given to return with new or worsening symptoms.  All patient questions were answered.       ____________________________________________  FINAL CLINICAL IMPRESSION(S) / ED DIAGNOSES  Final diagnoses:  Feared complaint without diagnosis      NEW MEDICATIONS STARTED DURING THIS VISIT:  ED Discharge Orders    None          This chart was dictated using voice recognition software/Dragon. Despite best efforts to proofread, errors can occur which can change the meaning.  Any change was purely unintentional.     Orvil Feil, PA-C 07/18/20 0930

## 2020-07-18 NOTE — Discharge Instructions (Addendum)
Susan English's blood pressure is slightly elevated during this urgent care encounter but not high enough to adjust her blood pressure medication. I would continue taking clonidine and hydrochlorothiazide as prescribed. Patient feels well and will follow up with her primary care provider if her blood pressure changes.

## 2020-08-04 ENCOUNTER — Ambulatory Visit (HOSPITAL_COMMUNITY)
Admission: EM | Admit: 2020-08-04 | Discharge: 2020-08-04 | Discharge status: Against Medical Advice | Payer: Medicaid Other

## 2020-08-04 NOTE — ED Notes (Signed)
Per PCA, pt arrived with a pt that was being seen and after being told only patients waiting can be in waiting area, stated she wanted to check in then also; partial way through registration process said, "Never mind" and walked out of [facility].

## 2020-08-08 ENCOUNTER — Emergency Department (HOSPITAL_COMMUNITY)
Admission: EM | Admit: 2020-08-08 | Discharge: 2020-08-11 | Disposition: A | Discharge status: Home / Self Care | Payer: Medicaid Other | Attending: Emergency Medicine | Admitting: Emergency Medicine

## 2020-08-08 DIAGNOSIS — R4789 Other speech disturbances: Secondary | ICD-10-CM | POA: Diagnosis not present

## 2020-08-08 DIAGNOSIS — F309 Manic episode, unspecified: Secondary | ICD-10-CM | POA: Insufficient documentation

## 2020-08-08 DIAGNOSIS — Z87891 Personal history of nicotine dependence: Secondary | ICD-10-CM | POA: Insufficient documentation

## 2020-08-08 DIAGNOSIS — Z20822 Contact with and (suspected) exposure to covid-19: Secondary | ICD-10-CM | POA: Insufficient documentation

## 2020-08-08 DIAGNOSIS — F3010 Manic episode without psychotic symptoms, unspecified: Secondary | ICD-10-CM

## 2020-08-08 DIAGNOSIS — I10 Essential (primary) hypertension: Secondary | ICD-10-CM | POA: Insufficient documentation

## 2020-08-08 DIAGNOSIS — Z046 Encounter for general psychiatric examination, requested by authority: Secondary | ICD-10-CM | POA: Diagnosis present

## 2020-08-08 DIAGNOSIS — F23 Brief psychotic disorder: Secondary | ICD-10-CM | POA: Insufficient documentation

## 2020-08-08 DIAGNOSIS — Z79899 Other long term (current) drug therapy: Secondary | ICD-10-CM | POA: Insufficient documentation

## 2020-08-08 DIAGNOSIS — Z91040 Latex allergy status: Secondary | ICD-10-CM | POA: Insufficient documentation

## 2020-08-08 LAB — COMPREHENSIVE METABOLIC PANEL
ALT: 17 U/L (ref 0–44)
AST: 21 U/L (ref 15–41)
Albumin: 3.8 g/dL (ref 3.5–5.0)
Alkaline Phosphatase: 87 U/L (ref 38–126)
Anion gap: 11 (ref 5–15)
BUN: 11 mg/dL (ref 6–20)
CO2: 25 mmol/L (ref 22–32)
Calcium: 9.2 mg/dL (ref 8.9–10.3)
Chloride: 97 mmol/L — ABNORMAL LOW (ref 98–111)
Creatinine, Ser: 0.74 mg/dL (ref 0.44–1.00)
GFR, Estimated: >60 mL/min (ref >60)
Glucose, Bld: 128 mg/dL — ABNORMAL HIGH (ref 70–99)
Potassium: 3.3 mmol/L — ABNORMAL LOW (ref 3.5–5.1)
Sodium: 133 mmol/L — ABNORMAL LOW (ref 135–145)
Total Bilirubin: 1 mg/dL (ref 0.3–1.2)
Total Protein: 7.8 g/dL (ref 6.5–8.1)

## 2020-08-08 LAB — ACETAMINOPHEN LEVEL: Acetaminophen (Tylenol), Serum: <10 ug/mL — ABNORMAL LOW (ref 10–30)

## 2020-08-08 LAB — CBC
HCT: 39.4 % (ref 36.0–46.0)
Hemoglobin: 13 g/dL (ref 12.0–15.0)
MCH: 28.6 pg (ref 26.0–34.0)
MCHC: 33 g/dL (ref 30.0–36.0)
MCV: 86.8 fL (ref 80.0–100.0)
Platelets: 335 10*3/uL (ref 150–400)
RBC: 4.54 MIL/uL (ref 3.87–5.11)
RDW: 13.1 % (ref 11.5–15.5)
WBC: 13.9 10*3/uL — ABNORMAL HIGH (ref 4.0–10.5)
nRBC: 0 % (ref 0.0–0.2)

## 2020-08-08 LAB — I-STAT BETA HCG BLOOD, ED (MC, WL, AP ONLY): I-stat hCG, quantitative: <5 m[IU]/mL (ref <5)

## 2020-08-08 LAB — RESP PANEL BY RT-PCR (FLU A&B, COVID) ARPGX2
Influenza A by PCR: NEGATIVE
Influenza B by PCR: NEGATIVE
SARS Coronavirus 2 by RT PCR: NEGATIVE

## 2020-08-08 LAB — RAPID URINE DRUG SCREEN, HOSP PERFORMED
Amphetamines: NOT DETECTED
Barbiturates: NOT DETECTED
Benzodiazepines: NOT DETECTED
Cocaine: NOT DETECTED
Opiates: NOT DETECTED
Tetrahydrocannabinol: POSITIVE — AB

## 2020-08-08 LAB — SALICYLATE LEVEL: Salicylate Lvl: <7 mg/dL — ABNORMAL LOW (ref 7.0–30.0)

## 2020-08-08 LAB — ETHANOL: Alcohol, Ethyl (B): <10 mg/dL (ref <10)

## 2020-08-08 MED ORDER — LORAZEPAM 2 MG/ML IJ SOLN
2.0000 mg | Freq: Once | INTRAMUSCULAR | Status: AC
  Administered 2020-08-08: 2 mg via INTRAMUSCULAR
  Filled 2020-08-08: qty 1

## 2020-08-08 MED ORDER — BUPRENORPHINE HCL 8 MG SL SUBL: 8.0000 mg | SUBLINGUAL_TABLET | Freq: Four times a day (QID) | SUBLINGUAL | Status: DC

## 2020-08-08 MED ORDER — HYDROCODONE-ACETAMINOPHEN 5-325 MG PO TABS: 1.0000 | ORAL_TABLET | ORAL | Status: DC | PRN

## 2020-08-08 MED ORDER — HYDROXYZINE PAMOATE 50 MG PO CAPS
100.0000 mg | ORAL_CAPSULE | Freq: Three times a day (TID) | ORAL | Status: DC
  Filled 2020-08-08: qty 2

## 2020-08-08 MED ORDER — BUPRENORPHINE HCL-NALOXONE HCL 2-0.5 MG SL SUBL
2.0000 | SUBLINGUAL_TABLET | Freq: Three times a day (TID) | SUBLINGUAL | Status: DC
  Administered 2020-08-08 – 2020-08-11 (×8): 2 via SUBLINGUAL
  Filled 2020-08-08 (×11): qty 2

## 2020-08-08 MED ORDER — ZIPRASIDONE MESYLATE 20 MG IM SOLR
20.0000 mg | Freq: Once | INTRAMUSCULAR | Status: AC
  Administered 2020-08-08: 20 mg via INTRAMUSCULAR
  Filled 2020-08-08: qty 20

## 2020-08-08 MED ORDER — CLONIDINE HCL 0.2 MG PO TABS
0.1000 mg | ORAL_TABLET | Freq: Every day | ORAL | Status: DC
  Administered 2020-08-08 – 2020-08-11 (×4): 0.1 mg via ORAL
  Filled 2020-08-08 (×4): qty 1

## 2020-08-08 MED ORDER — STERILE WATER FOR INJECTION IJ SOLN
INTRAMUSCULAR | Status: AC
  Filled 2020-08-08: qty 10

## 2020-08-08 MED ORDER — HYDROCHLOROTHIAZIDE 25 MG PO TABS
50.0000 mg | ORAL_TABLET | Freq: Every morning | ORAL | Status: DC
  Administered 2020-08-08 – 2020-08-11 (×4): 50 mg via ORAL
  Filled 2020-08-08 (×4): qty 2

## 2020-08-08 MED ORDER — LORAZEPAM 2 MG/ML IJ SOLN
2.0000 mg | Freq: Once | INTRAMUSCULAR | Status: DC
  Filled 2020-08-08: qty 1

## 2020-08-08 MED ORDER — ZIPRASIDONE MESYLATE 20 MG IM SOLR
INTRAMUSCULAR | Status: AC
  Filled 2020-08-08: qty 20

## 2020-08-08 MED ORDER — STERILE WATER FOR INJECTION IJ SOLN
INTRAMUSCULAR | Status: AC
  Administered 2020-08-08: 1 mL
  Filled 2020-08-08: qty 10

## 2020-08-08 MED ORDER — HYDROXYZINE HCL 50 MG PO TABS
100.0000 mg | ORAL_TABLET | Freq: Three times a day (TID) | ORAL | Status: DC
  Administered 2020-08-08 – 2020-08-11 (×10): 100 mg via ORAL
  Filled 2020-08-08 (×10): qty 2

## 2020-08-08 MED ORDER — TRAZODONE HCL 50 MG PO TABS
50.0000 mg | ORAL_TABLET | Freq: Every day | ORAL | Status: DC
  Administered 2020-08-08 – 2020-08-11 (×4): 50 mg via ORAL
  Filled 2020-08-08 (×4): qty 1

## 2020-08-08 NOTE — ED Notes (Signed)
Pt pacing the halls informed she must stay in her room. Pt complaint at this time.

## 2020-08-08 NOTE — ED Notes (Signed)
Pt wanded by security. Pt is free of any items.

## 2020-08-08 NOTE — ED Notes (Signed)
Pt shouting someone at [employer] came and told her who she was and that she was a woman. Pt pacing out in hallway talking to security, pt reports she wants to go home. Pt reports people following her.
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
## 2020-08-08 NOTE — ED Notes (Signed)
Pt stepping out of her room and shouting she wants to watch tv.

## 2020-08-08 NOTE — ED Notes (Signed)
Pt crying out stating she wants her mom.

## 2020-08-08 NOTE — ED Notes (Signed)
TTS in progress 

## 2020-08-08 NOTE — ED Notes (Signed)
Pt pacing around, pt refusing to change into burgundy scrubs.

## 2020-08-08 NOTE — ED Provider Notes (Signed)
[facility] EMERGENCY DEPARTMENT Provider Note   CSN: [id number] Arrival date & time: 08/08/20  0137     History Chief Complaint  Patient presents with  . Psychiatric Evaluation    Susan English is a 30 y.o. female.  The history is provided by the patient and medical records.   30 y.o. F with hx of HTN, obesity, headaches, presenting to the ED for psychiatric evaluation.  Patient states she needs help because of "life".  She admits to "lots of things" bothering her.  Patient then begins speaking about her son Susan English and his shoes, the number 3 and quantities of it, telling me that staff smells of lysol, etc.  She is rambling and not making much sense at present.  Patient initially somewhat redirectable at present.  Past Medical History:  Diagnosis Date  . Headache(784.0)   . 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 Hypertension   . Obesity     Patient Active Problem List   Diagnosis Date Noted  . Successful VBAC 12/29/2017  . GDM, class A1 12/27/2017  . Gestational diabetes 11/29/2017  . Acute pain of right knee 11/05/2017  . Obesity complicating pregnancy in second trimester   . Tobacco smoking complicating pregnancy in second trimester   . Status post cryotherapy of skin lesion 06/11/2017  . Smoker 06/11/2017  . Supervision of other normal pregnancy, antepartum 06/10/2017  . Previous cesarean delivery, antepartum condition or complication 06/10/2017  . 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 CESAREAN SECTION    . ORIF ANKLE FRACTURE Right 12/22/2012   Procedure: OPEN REDUCTION INTERNAL FIXATION (ORIF) ANKLE FRACTURE;  Surgeon: Mable Paris, MD;  Location:  SURGERY CENTER;  Service: Orthopedics;  Laterality: Right;  . sweat gland       OB History    Gravida  2   Para  2   Term  2   Preterm       AB      Living  2     SAB      IAB      Ectopic      Multiple  0   Live Births  2           Family History  Problem Relation Age of Onset  . Hypertension Mother   . Diabetes Mother   . Diabetes Maternal Grandmother   . Breast cancer Paternal Grandmother     Social History   Tobacco Use  . Smoking status: Former Smoker    Packs/day: 0.50  . Smokeless tobacco: Never Used  Substance Use Topics  . Alcohol use: No    Comment: occ  . Drug use: No    Home Medications Prior to Admission medications   Medication Sig Start Date End Date Taking? Authorizing Provider  ACCU-CHEK FASTCLIX LANCETS MISC 1 Device by Does not apply route 4 (four) times daily. Patient not taking: Reported on 02/08/2018 11/30/17   Thressa Sheller D, CNM  cloNIDine (CATAPRES) 0.1 MG tablet Take 0.1 mg by mouth at bedtime. 07/14/20   [provider]  cyclobenzaprine (FLEXERIL) 5 MG tablet Take 1 tablet (5 mg total) by mouth every 8 (eight) hours as needed for muscle spasms. Patient not taking: Reported on 02/08/2018 10/14/17   Aviva Signs, CNM  diphenhydrAMINE (BENADRYL) 25 MG tablet Take 25 mg by mouth  daily.    [provider]  glucose blood (ACCU-CHEK GUIDE) test strip Use as instructed QID Patient not taking: Reported on 02/08/2018 11/30/17   Armando Reichert, CNM  hydrochlorothiazide (HYDRODIURIL) 25 MG tablet Take 50 mg by mouth every morning. 07/10/20   [provider]  HYDROcodone-acetaminophen (NORCO/VICODIN) 5-325 MG tablet Take 1 tablet by mouth every 4 (four) hours as needed for moderate pain.    [provider]  hydrOXYzine (ATARAX/VISTARIL) 10 MG tablet Take 1 tablet (10 mg total) by mouth 3 (three) times daily as needed. Patient not taking: Reported on 02/08/2018 10/22/17   Currie Paris, NP  hydrOXYzine (VISTARIL) 50 MG capsule Take by mouth. 07/10/20   [provider]  prenatal vitamin w/FE, FA (PRENATAL 1 + 1) 27-1 MG TABS tablet Take 1  tablet by mouth daily at 12 noon. Patient not taking: Reported on 02/08/2018 05/27/17   Marylene Land, CNM  SUBOXONE 8-2 MG FILM Place under the tongue 4 (four) times daily. 07/10/20   [provider]  traZODone (DESYREL) 50 MG tablet  07/18/20   [provider]    Allergies    Latex  Review of Systems   Review of Systems  Unable to perform ROS: Psychiatric disorder    Physical Exam Updated Vital Signs BP (!) 168/106 (BP Location: Right Arm)   Pulse (!) 107   Temp 98.3 F (36.8 C) (Oral)   Resp (!) 22   LMP 07/18/2020   SpO2 100%   Physical Exam Vitals and nursing note reviewed.  Constitutional:      Appearance: She is well-developed.     Comments: Disheveled appearing  HENT:     Head: Normocephalic and atraumatic.  Eyes:     Conjunctiva/sclera: Conjunctivae normal.     Pupils: Pupils are equal, round, and reactive to light.  Cardiovascular:     Rate and Rhythm: Normal rate and regular rhythm.     Heart sounds: Normal heart sounds.  Pulmonary:     Effort: Pulmonary effort is normal.     Breath sounds: Normal breath sounds.  Abdominal:     General: Bowel sounds are normal.     Palpations: Abdomen is soft.  Musculoskeletal:        General: Normal range of motion.     Cervical back: Normal range of motion.  Skin:    General: Skin is warm and dry.  Neurological:     Mental Status: She is alert.     Comments: Awake, alert, moving extremities well, no focal deficits  Psychiatric:     Comments: Tangential speech, rapid, pressured, requiring redirection multiple times, some religious and racial talk observed, poor insight into current state     ED Results / Procedures / Treatments   Labs (all labs ordered are listed, but only abnormal results are displayed) Labs Reviewed  CBC - Abnormal; Notable for the following components:      Result Value   WBC 13.9 (*)    All other components within normal limits  COMPREHENSIVE METABOLIC PANEL   ETHANOL  SALICYLATE LEVEL  ACETAMINOPHEN LEVEL  RAPID URINE DRUG SCREEN, HOSP PERFORMED  I-STAT BETA HCG BLOOD, ED ([facility], [facility], [facility] ONLY)    EKG None  Radiology No results found.  Procedures Procedures   CRITICAL CARE Performed by: Garlon Hatchet   Total critical care time: 45 minutes  Critical care time was exclusive of separately billable procedures and treating other patients.  Critical care was necessary to treat  or prevent imminent or life-threatening deterioration.  Critical care was time spent personally by me on the following activities: development of treatment plan with patient and/or surrogate as well as nursing, discussions with consultants, evaluation of patient's response to treatment, examination of patient, obtaining history from patient or surrogate, ordering and performing treatments and interventions, ordering and review of laboratory studies, ordering and review of radiographic studies, pulse oximetry and re-evaluation of patient's condition.   Medications Ordered in ED Medications  sterile water (preservative free) injection (has no administration in time range)  ziprasidone (GEODON) injection 20 mg (20 mg Intramuscular Given 08/08/20 0240)    ED Course  I have reviewed the triage vital signs and the nursing notes.  Pertinent labs & imaging results that were available during my care of the patient were reviewed by me and considered in my medical decision making (see chart for details).    MDM Rules/Calculators/A&P  30 y.o. F here requesting psychiatric evaluation.  She has tangential speech, somewhat difficulty to redirect.  Some of what she says is nonsensical, other hyper religious/racial statements.  She appears manic currently.  As far as I can tell, no prior history of psychiatric disease.  She is not able to give me any significant reliable history.  She is becoming increasingly more agitated since being placed into treatment room, speaking often  about her son Susan English.  Screening labs pending.  Will plan for TTS consult.    2:30 AM Patient's behavior is escalating, getting progressively more loud, now cursing at staff and other patients in the ED.  She is not redirectable at this point and is becoming very disruptive.  Will give IM geodon.  3:13 AM Spoke with patient's mother, Nakyra Bourn-- states she has been behaving bizarrely over the past few.  Started on Friday, worse over the weekend and horrible since yesterday.  States she has been erratic, very emotional, yelling, screaming, etc.  States she took off from the house around 0130 AM and has not answered her phone since that time.  Mother does report she had substance abuse issues with percocet and [facility] [place], [place].  States she was on suboxone, medications for anxiety/depression (unsure specifics).  States she was supposed to be doing therapy there as well but does not think it is helping.  Grandmother is caring for patient's son AJ at this time.  Mother made aware that she is here and is currently safe, she is agreeable to IVC if necessary for her own safety.  After geodon, patient much more calm and asked to go to bed.  NT has given her fresh sheets and blankets, she is currently in bed resting.  Labs reviewed-- grossly reassuring.  UDS + for THC.  covid screen negative.  Medically cleared.  Awaiting TTS evaluation.  Final Clinical Impression(s) / ED Diagnoses Final diagnoses:  Manic behavior [facility])    Rx / DC Orders ED Discharge Orders    None       Garlon Hatchet, PA-C 08/08/20 8937    Geoffery Lyons, MD 08/08/20 0700

## 2020-08-08 NOTE — ED Notes (Signed)
Pt making second phone call of the day. Informed pt that this is her second and last phone call she can make today.

## 2020-08-08 NOTE — Progress Notes (Signed)
Patient meets inpatient criteria per [name], NP.    Patient was referred to the following facilities:   Destination  Service Provider Address Phone Fax  [facility]  [address] [place] [address] [phone]  [facility]  [address], [place] [address] [phone]  [facility]  [address], [place] [address] [phone]  [facility]  [address] [place] [address] [phone]  [facility]  [address], [place] [address] [phone]  [facility]  [address] [place] [address] [phone]  [facility]  [address] [place], [place] [address] [phone]  [facility]  [address] [place], [place] [address] [phone]  [facility]  [phone] [place] [address] [phone]  [facility]  [address], [place] [address] [phone]  [facility]  [address] [place] [address] [phone]  [facility]  [address] [place] [address] [phone]  [facility]  [address] [place] [address] [phone]  [facility]  [address], [place] [address] [phone]  [facility]  [address] [place] [address] [phone]  [facility]  [address] [place] [address] [phone]     CSW will continue to monitor for disposition.  Penni Homans, MSW, LCSW 08/08/2020 2:20 PM

## 2020-08-08 NOTE — ED Notes (Signed)
Patient fell asleep during TTS assessment.

## 2020-08-08 NOTE — ED Triage Notes (Addendum)
Pt present to ED with disorganize thoughts, Pt states she needs to be seen by an MD  Due to" life" Pt is unable to describe any symptoms or reason she is here. Pt is ramberling on about variety of subjects  Pt denies any HI or SI thoughts. Pt reports she took marijuana today. This RN ask about her lip and what happen and pt went off topic and was not able to tell this RN what happen

## 2020-08-08 NOTE — ED Notes (Signed)
Pt reporting being scared. Pt still rambling.

## 2020-08-08 NOTE — ED Notes (Addendum)
Pt with increasing in agitation. Difficulty following instructions. Pacing outside of the room after being instructed to stay at or near room. Pt also beginning to yell expressing flight of ideas with pressured speech. Pt ranting about wanting to see family members. Several verbal attempts made without success.   Security at bedside. IM Geodon given to left deltoid without hands on with pt. Verbal order for medications received by PA Fayrene Helper.   Sitter and security remain at bedside. Care handoff given to [facility].

## 2020-08-08 NOTE — ED Notes (Signed)
Patient belongs are in locker#4 in purple zone

## 2020-08-08 NOTE — BH Assessment (Signed)
TTS attempted to complete consult @ 6:33 am, patient given medication and was too sleepy to complete assessment. Patient will be seen when more alert.

## 2020-08-08 NOTE — ED Notes (Signed)
Pt shouting out at staff. Pt asked to put burgundy scrubs on. Pt reports why should she put on scrubs when she has clothes on. Pt rambling about her family, and crying about her brother. Pt uncooperative at this time. Pt has a flight of ideas.

## 2020-08-08 NOTE — ED Notes (Signed)
Patient currently speaking with staff.  Patient conversation is delusional and disorganized.  Patient states, "I don't understand car doors.  That's why I believe in the power of medicine.  I just need someone to hear me."

## 2020-08-09 MED ORDER — ZIPRASIDONE MESYLATE 20 MG IM SOLR
20.0000 mg | Freq: Once | INTRAMUSCULAR | Status: AC
  Administered 2020-08-09: 20 mg via INTRAMUSCULAR
  Filled 2020-08-09: qty 20

## 2020-08-09 MED ORDER — LORAZEPAM 2 MG/ML IJ SOLN
2.0000 mg | Freq: Once | INTRAMUSCULAR | Status: AC
  Administered 2020-08-09: 2 mg via INTRAMUSCULAR
  Filled 2020-08-09: qty 1

## 2020-08-09 MED ORDER — STERILE WATER FOR INJECTION IJ SOLN
INTRAMUSCULAR | Status: AC
  Administered 2020-08-09: 1.2 mL
  Filled 2020-08-09: qty 10

## 2020-08-09 MED ORDER — ACETAMINOPHEN 500 MG PO TABS
1000.0000 mg | ORAL_TABLET | Freq: Four times a day (QID) | ORAL | Status: DC | PRN
  Administered 2020-08-10: 1000 mg via ORAL
  Filled 2020-08-09 (×2): qty 2

## 2020-08-09 MED ORDER — DIPHENHYDRAMINE HCL 50 MG/ML IJ SOLN
25.0000 mg | Freq: Once | INTRAMUSCULAR | Status: AC
  Administered 2020-08-09: 25 mg via INTRAMUSCULAR
  Filled 2020-08-09: qty 1

## 2020-08-09 NOTE — ED Notes (Signed)
Pt states that she needs to have a BM and is acting better. Will try to release restraints.

## 2020-08-09 NOTE — ED Notes (Signed)
Pt becoming more defiant when dealing with staff. Refusing to go back in room when asked. Pt attempting to go into other patients rooms. Security to the bedside. ED PA made aware of pt behavior.

## 2020-08-09 NOTE — ED Notes (Signed)
Pt requesting to speak with the chaplin.

## 2020-08-09 NOTE — ED Provider Notes (Signed)
Patient pacing, anxious/agitated behavior. Staff providing reassurance. Remains anxious.  Ativan im for symptom improvement.      Cathren Laine, MD 08/09/20 0130

## 2020-08-09 NOTE — ED Notes (Signed)
Patient continues to express fear that she has a disease. Patient asked "Are you going to have to cut my boob off because I have cancer" reassured patient that to my knowledge she does not have cancer and our intent is not to cut off any of her body parts. Patient then asked again why is she here. Writer asked patient if she remembers going to urgent care. Patient was unsure, but remembers coming to the emergency room. Reminded patient that when she arrived she was asking to see a doctor for help with her life and that she is here for her mental health crisis. Patient expressed confusion over rules and fear of being held down or hurt. Patient expressed fear that staff would administer a medication that would stop her heart or brain. Reassured patient that staff had no intent to harm her and that we would not restrain her as long as she behaved in a manner that was not unsafe towards staff or herself and that any medication that we give her is to help her, not harm. Patient continued to have more concerns over rules and why she is here and behavior expected of her. Writer had a lengthy conversation with patient about safe behavior, rules that we follow in purple and how we are helping her with her mental health crisis.   Patient was anxious and fearful, but was speaking clearly, at a normal pace and was not verbally or physically aggressive towards staff.    RN aware.

## 2020-08-09 NOTE — ED Notes (Addendum)
Pt is calm, cooperative, and receptive to care. Night time medications were taken without difficulty. Pt is in bed, but has not slept. Discussion with patient regarding next steps in care was had and pt is amenable to outpatient psychiatric placement for further care.

## 2020-08-09 NOTE — BH Assessment (Addendum)
Per Alan Ripper, patient accepted to [facility] for admission pending IVC,  08/10/2020 (after 9am).   The accepting provider is Dr. Darlys Gales. Upon arrival patient will present to the [facility]. Nurse to nurse report [phone].   [facility] nurse Fraser Din, RN), provided disposition updates.   Nursing states that EDP (Dr. Jeraldine Loots) will not complete the IVC, which is contingent upon patient's acceptance to the facility.    [facility] has rescinded patient's offer for admission 08/10/2020. The staff at [facility] share concerns that patient is "psychotic" and "may not sign herself into the facility upon arrival". Patient's nurse ([place], RN) and psych provider [name], NP), provided updates.

## 2020-08-09 NOTE — ED Notes (Signed)
Patient left her room and was in the hallway. When asked to go back to her room, patient ignored Clinical research associate, as well as the Charity fundraiser. Patient visually fixated on another patient who was in another room, calmly lying in his bed. Security was called because patient would not follow commands. Patient after being told several times went back into room.

## 2020-08-09 NOTE — ED Notes (Signed)
Patient continues to stand in doorway of room, despite being asked/told numerous times by staff that she cannot stand in the doorway.

## 2020-08-09 NOTE — ED Notes (Addendum)
Pt is becoming more agitated and trying to assault the staff. MD made aware. Restraints possibly to be used.

## 2020-08-09 NOTE — ED Notes (Signed)
Patient stated she needed to use the bathroom but wanted a wipe to clean the seat off first, as another patient had just used the restroom. Writer wiped seat off, and told patient that she could go ahead. Patient walked into the bathroom and immediately ran out tearful, stating that the bathroom used to be her safe place and wasn't anymore. Writer asked patient to explain. Patient stated that she "tried to get her to understand" when asked who her is, patient replied "My mom, my mommy, my momma. She just doesn't understand. She prayed for my relationship with my ex to be over. And now it is and I have found my soulmate and she's not understanding." Writer consoled patient and reminded her right now her focus is Alexiya, and keeping Haruko calm. Patient also expressed as well that she "came here to get help and no one is helping me"    RN aware. Will continue to monitor

## 2020-08-09 NOTE — ED Notes (Signed)
Chaplin at the bedside.

## 2020-08-09 NOTE — ED Notes (Addendum)
Pt was instructed by staff to stop raising the bed up. Pt knows how to raise the bed upwards, in addition to unlocking and locking the bed. Pt advised it is a safety risk to tamper with these things. RN aware.

## 2020-08-09 NOTE — ED Notes (Signed)
Attempted to call [facility] for TTS without answer.

## 2020-08-09 NOTE — ED Notes (Signed)
Patient repeatedly asking if she has a disease and if she is going to die. Patient appears distressed and anxious. Writer reassured patient that she is here for a mental health crisis and that she does not have a disease that writer is aware of. Patient calmed down. RN aware, will continue to monitor.

## 2020-08-09 NOTE — ED Notes (Addendum)
Pt continues to come out of room,yell and not follow commands. Will not listen and will not return to room. Told pt that if she continues to act this way, she will be placed back in restraints.

## 2020-08-09 NOTE — ED Notes (Signed)
Patient states "I wish y'all would be nicer to me" Writer asked patient what she meant, patient states that she has never been treated like this before at the hospital with all the rules. Advised patient that we have rules and structure for patients in a mental health crisis, as this helps them understand the boundaries and what to expect from [place]. Also reassured patient that staff is not singling her out with rules and other patients must also follow them. Patient then stated "But y'all kill people" writer reassured patient that we do not kill people here, we help them. Patient then asked when she is going to see her doctor.    Patient then came to doorway to "Check out the situation in the hallway" as the RN and Tech were busy with another patient. Asked patient to go sit back down, as she does not need to worry about what happens in the hall. Patient then states that no one has told her what is medically wrong and why she is here.   RN aware. Will continue to monitor.

## 2020-08-09 NOTE — ED Notes (Signed)
Patient walked into the hallway asking why [facility] was closing up down here. When informed that EVS was doing her daily cleaning, and just making sure that the unit was clean for [place]. She then asked "Well who is going to do the godly duties?" and sat in a chair in the hallway. She then asked if she could call her daughter using the TTS cart. Writer explained that the TTS cart is for patients use in talking to counselors only and not personal calls. Patient was asked to go back to room.

## 2020-08-09 NOTE — ED Notes (Signed)
Pt once again asked what happens after she leaves here and what is she doing here "Am I sick? Do I have a disease?" Writer explained again that patient is having a mental health crisis. Patient was educated that right now we are waiting for inpatient placement. Pt asked why she needs inpatient placement. Pt was informed that inpatient placement is so that she can be educated on coping skills in a safe and structured environment, as well as have access to therapy, group therapy and medical treatment. Patient appeared less anxious after conversation.   RN aware.

## 2020-08-09 NOTE — ED Notes (Signed)
Pt on phone with family  

## 2020-08-09 NOTE — ED Notes (Signed)
Patient asked writer "why do y'all have colors everywhere?" When asked what she means, patient said "like orange, blue and purple everywhere" but could not elaborate more on where or what she meant. Patient currently talking to herself in the room while sitting on the bed. "I was just trying to do the right thing, and be all I could be for my kids. I was just talking to god. I just wanted to do the right thing"   RN aware, will continue to monitor.

## 2020-08-09 NOTE — Progress Notes (Signed)
No current beds available at [facility].  Physician recommended to refer pt out.   Patient meets inpatient criteria per [name], NP.    Patient was referred to the following facilities:   Service Provider Request Status Selected Services Address Phone Fax Patient Preferred  [facility]  Pending - Request Sent N/A [address], [place] [address] [phone] --  [facility]  Pending - Request Sent N/A [address], [place] [address] [phone] --  [facility]  Pending - Request Sent N/A [address] [place] [address] [phone] --  [facility]  Pending - Request Sent N/A [address] [place] [address] [phone] --  [facility]  Pending - Request Sent N/A [address], [place] [address] [phone] --  [facility]  Pending - Request Sent N/A [address], [place] [address] [phone] --  [facility]  Pending - Request Sent N/A [address] [place], [place] [address] [phone] --  [facility]  Pending - Request Sent N/A [address] [place] [address] [phone] --  [facility]  Pending - Request Sent N/A [id number] [place] [address] [phone] --  [facility]  Pending - Request Sent N/A [address], [place] [address] [phone] --  [facility]  Pending - Request Sent N/A [address] [place] [address] [phone] --  [facility]  Pending - Request Sent N/A [address] [place] [address] [phone] --  [facility]  Pending - Request Sent N/A [address], [place] [address] [phone] --  [facility]  Pending - Request Sent N/A [address], [place] [address] [phone] --  [facility]  Pending - Request Sent N/A [address] [place] [address] [phone] --  [facility]  Pending - Request Sent N/A [address] [place] [address] [phone] --     CSW will continue to monitor for disposition.  Penni Homans, MSW, LCSW 08/09/2020 2:44 PM

## 2020-08-09 NOTE — Progress Notes (Signed)
Responded to Unit page to support statff and patient.  Visited with patient who appears to be overwhelmed by  others demands upon her. She indicated that she is trying to find a way to better care for self.  She mention that her mother is demanding and sometimes its just too much.  Patient was sound in conversation and aware that she is experience some unusual behavior and is a little out of character. She said she wanted to talk with doctor more.   Provided emotional and spiritual support.  Chaplain available as needed.  Venida Jarvis, [place], [facility], Pager [phone]

## 2020-08-09 NOTE — ED Notes (Signed)
Susan English, in [place], is pt Management consultant. Pt states that she sees her twice a week.

## 2020-08-09 NOTE — ED Notes (Signed)
Pt unlocked bed and moved bed across room to the wall. Asked pt to move the bed back but pt would not follow commands.

## 2020-08-09 NOTE — ED Notes (Signed)
Patient on the phone with family.

## 2020-08-09 NOTE — ED Notes (Signed)
Security called to bedside. Pt to be placed in restraints.

## 2020-08-10 ENCOUNTER — Emergency Department (HOSPITAL_COMMUNITY): Payer: Medicaid Other

## 2020-08-10 MED ORDER — GADOBUTROL 1 MMOL/ML IV SOLN
10.0000 mL | Freq: Once | INTRAVENOUS | Status: AC | PRN
  Administered 2020-08-10: 10 mL via INTRAVENOUS

## 2020-08-10 MED ORDER — LORAZEPAM 1 MG PO TABS
1.0000 mg | ORAL_TABLET | Freq: Once | ORAL | Status: AC
  Administered 2020-08-10: 1 mg via ORAL
  Filled 2020-08-10 (×2): qty 1

## 2020-08-10 MED ORDER — ZIPRASIDONE MESYLATE 20 MG IM SOLR
20.0000 mg | Freq: Once | INTRAMUSCULAR | Status: AC
  Administered 2020-08-10: 20 mg via INTRAMUSCULAR
  Filled 2020-08-10: qty 20

## 2020-08-10 MED ORDER — STERILE WATER FOR INJECTION IJ SOLN
INTRAMUSCULAR | Status: AC
  Filled 2020-08-10: qty 10

## 2020-08-10 MED ORDER — ZIPRASIDONE HCL 20 MG PO CAPS
40.0000 mg | ORAL_CAPSULE | Freq: Two times a day (BID) | ORAL | Status: DC
  Administered 2020-08-11: 40 mg via ORAL
  Filled 2020-08-10: qty 2

## 2020-08-10 MED ORDER — LORAZEPAM 2 MG/ML IJ SOLN
1.0000 mg | Freq: Once | INTRAMUSCULAR | Status: DC | PRN
  Filled 2020-08-10: qty 1

## 2020-08-10 MED ORDER — LORAZEPAM 2 MG/ML IJ SOLN
1.0000 mg | Freq: Once | INTRAMUSCULAR | Status: AC
  Administered 2020-08-10: 1 mg via INTRAVENOUS

## 2020-08-10 NOTE — Progress Notes (Signed)
CSW contacted [facility] admissions and spoke with [place], Charity fundraiser. It was reported that the patient is not in there system to be accepted. Chandreika, RN advised CSW to resend referral for review.  Crissie Reese, MSW, LCSW-A, LCAS-A Phone: [phone] Disposition/TOC

## 2020-08-10 NOTE — ED Notes (Signed)
Pt came out of room and requested to take ativan that she initial spit out and didn't want.

## 2020-08-10 NOTE — ED Notes (Signed)
Pt awake and allows staff to place an IV for upcoming MRI this morning.

## 2020-08-10 NOTE — ED Provider Notes (Signed)
Care assumed from [facility], PA-c at shift change with MRI pending.   In brief, this patient is a 30 y.o. F who is here for psychiatric evaluation.  Patient was initially brought in for erratic behavior.  She has been evaluated by psych and recommended for inpatient treatment.  Mom was concerned because patient has no prior history of psychiatric disorder.  She had discussed with overnight PA that patient had been having some headaches and they were concerned about there being another cause to her symptoms.  Neuro was consulted and recommended CT head as well as MRI with and without contrast.  Please see note from previous providers for full history/physical exam.   Physical Exam  BP 136/64 (BP Location: Right Arm)   Pulse 92   Temp 98.5 F (36.9 C) (Oral)   Resp 18   LMP 07/18/2020   SpO2 100%   Physical Exam  ED Course/Procedures   Clinical Course as of 08/10/20 1610  Sat Aug 10, 2020  0602 Spoke with Dr. Amada Jupiter, neurology.  Given no history of psychiatric disorders, patient warrants MRI brain with and without.  Neurology service request callback after MRI has been complete. [MM]    Clinical Course User Index [MM] McDonald, Mia A, PA-C    Procedures  No results found for this or any previous visit (from the past 24 hour(s)).  MDM    PLAN: Patient pending MRI.  MDM:  MRI with and without showed no acute abnormalities.  I discussed with Dr. Viviann Spare (Neuro) who reviewed the image and agreed that there are no acute findings noted on MRI that would explain patient's symptoms.  9:01 AM: Updated mom on MRI findings and continued search for placement for patient.  Portions of this note were generated with Scientist, clinical (histocompatibility and immunogenetics). Dictation errors may occur despite best attempts at proofreading.    1. Manic behavior (HCC)         Rosana Hoes 08/10/20 9604    [name], MD 08/10/20 2124

## 2020-08-10 NOTE — ED Notes (Addendum)
Pt asking to shower. Pt informed that she can shower in morning. Pt asked if she could wash up. Provided a wash rag and soap.

## 2020-08-10 NOTE — Progress Notes (Deleted)
Per Ophelia Shoulder, NP, patient meets criteria for inpatient treatment. There are no available or appropriate beds at [facility] today. CSW faxed referrals to the following facilities for review:  [facility]  [facility]  TTS will continue to seek bed placement.  Crissie Reese, MSW, LCSW-A, LCAS-A Phone: [phone] Disposition/TOC

## 2020-08-10 NOTE — Progress Notes (Signed)
Per Ophelia Shoulder, NP, patient continues to meet criteria for inpatient treatment. There are no available or appropriate beds at [facility] today. CSW re-faxed referrals to the following facilities for review:  [facility]  [facility]  TTS will continue to seek bed placement.  Crissie Reese, MSW, LCSW-A, LCAS-A Phone: [phone] Disposition/TOC

## 2020-08-10 NOTE — ED Notes (Signed)
Pt offered ativan to help her relax and get some sleep. Pt scared that medication will make her sleep forever, paranoid that the medications isnt good for her.  Pt placed pill in mouth then spit it out into cup.   Pt says she is scared and thinks that the staff think that she did something wrong

## 2020-08-10 NOTE — ED Provider Notes (Signed)
Emergency Medicine Observation Re-evaluation Note  Susan English is a 30 y.o. female, seen on rounds today.  Pt initially presented to the ED for complaints of Psychiatric Evaluation Currently, the patient is awaiting admission for inpatient treatment. Pt w/o medical complaint this AM. Took a shower and is sitting on side of bed.   Physical Exam  BP 136/64 (BP Location: Right Arm)   Pulse 92   Temp 98.5 F (36.9 C) (Oral)   Resp 18   LMP 07/18/2020   SpO2 100%  Physical Exam General: alert, NAD Cardiac: regular rate Lungs: no respiratory distress Psych: anxious, tearful, cooperative  ED Course / MDM  EKG:   I have reviewed the labs performed to date as well as medications administered while in observation.  Recent changes in the last 24 hours include no acute changes .  Plan  Current plan is for inpatient treatment. Patient is under full IVC at this time.   Susan English 08/10/20 6837    [name], MD 08/10/20 2124

## 2020-08-10 NOTE — ED Notes (Signed)
Pt called and spoke with mother on phone.

## 2020-08-10 NOTE — ED Provider Notes (Signed)
RN has spoken with the patient's mother.  RN was updating family and during the course of the conversation the patient's mother stated that the patient does not have a past medical history of psychosis or mental health problems.  Her mother is concerned that over the last month the patient has been complaining of severe headaches.  Patient's mother stated to RN that " she would lock herself in the room and no one could even talk to her it would make her want to scream in pain."  Family expresses concern that a CT or other imaging of the patient's brain has not been performed.  On my evaluation, the patient states that she has a history of tension headaches.  She does report that she has been having posterior headaches that have been more severe for several weeks.  She also reports that she has been having tingling in her bilateral hands and feet.  She otherwise denies numbness or weakness.  No visual changes, dizziness, lightheadedness, nausea, vomiting, diarrhea, slurred speech, facial droop.  Patient does report that she was taking Suboxone for opioid use disorder.  She endorses marijuana use, but denies any other illicit or recreational substance use.  She denies SI, HI, or auditory visual hallucinations at this time.  05:55- Spoke with the patient's mother, Rosey Bath.  Reports that the patient and her 2 children have been living with her since September after the patient's significant other with whom she was in a 13-year relationship left.  Patient has been able to maintain a stable job and that time and provide for her children.  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  She was started on Suboxone.  During her treatment, she was also started on an antidialytic and antidepressant medication as well as blood pressure medication.  Prior to going to the rehab facility, the patient has never been diagnosed with anxiety, depression, or any other  mental health disorders.  For the last month, the patient has been complaining of severe posterior headaches.  However, on Friday 3/25, the patient's mother noted an abrupt change in the patient where she developed erratic behavior and began making hyper religious comments.  Patient's mother reports that she also was not sleeping 1 night, but then slept soundly the next.  She states that 1 minute the patient would tell her family to stop talking to her and then later proclaim "why is no one talking to me."  She was still able to go to work and complete her job for the first few days of her symptoms, but began missing work a day or 2 prior to coming to the emergency department.  Patient continued to take her home Suboxone prior to coming to the ER.   Physical Exam  BP 136/64 (BP Location: Right Arm)   Pulse 92   Temp 98.5 F (36.9 C) (Oral)   Resp 18   LMP 07/18/2020   SpO2 100%   Physical Exam Vitals and nursing note reviewed.  Constitutional:      General: She is not in acute distress. HENT:     Head: Normocephalic.  Eyes:     Extraocular Movements: Extraocular movements intact.     Conjunctiva/sclera: Conjunctivae normal.     Pupils: Pupils are equal, round, and reactive to light.  Cardiovascular:     Rate and Rhythm: Normal rate and regular rhythm.     Pulses: Normal pulses.     Heart sounds:  Normal heart sounds. No murmur heard. No friction rub. No gallop.   Pulmonary:     Effort: Pulmonary effort is normal. No respiratory distress.     Breath sounds: No stridor. No wheezing, rhonchi or rales.  Chest:     Chest wall: No tenderness.  Abdominal:     General: There is no distension.     Palpations: Abdomen is soft. There is no mass.     Tenderness: There is no abdominal tenderness. There is no right CVA tenderness, left CVA tenderness, guarding or rebound.     Hernia: No hernia is present.  Musculoskeletal:     Cervical back: Neck supple.     Right lower leg: No edema.      Left lower leg: No edema.  Skin:    General: Skin is warm.     Capillary Refill: Capillary refill takes less than 2 seconds.     Findings: No rash.  Neurological:     Mental Status: She is alert.     Comments: GCS 15.  Patient initially states that you are is 2021, but then corrects herself to 2022.  She is otherwise oriented.  Answers questions appropriately.  Cranial nerves II through XII are grossly intact.  5-5 strength against resistance of the bilateral upper and lower extremities.  Negative Romberg.  No pronator drift.  Finger-to-nose is intact bilaterally.  Psychiatric:        Attention and Perception: She is inattentive. She does not perceive auditory or visual hallucinations.        Mood and Affect: Affect is labile and inappropriate.        Speech: Speech is rapid and pressured.        Behavior: Behavior normal. Behavior is cooperative.        Thought Content: Thought content is paranoid. Thought content does not include homicidal or suicidal ideation. Thought content does not include suicidal plan.        Judgment: Judgment is impulsive and inappropriate.     ED Course/Procedures   Clinical Course as of 08/10/20 0641  Sat Aug 10, 2020  0602 Spoke with Dr. Amada Jupiter, neurology.  Given no history of psychiatric disorders, patient warrants MRI brain with and without.  Neurology service request callback after MRI has been complete. [MM]    Clinical Course User Index [MM] Leul Narramore A, PA-C    Procedures  MDM  30 year old female who has been boarding in the ER while awaiting an inpatient bed with behavioral health.  Patient was seen and evaluated at the request of RN after the patient's mother called for an update and expressed concern that the patient had no psychiatric history and prior to onset of erratic behavior had been having severe headaches for the last month.  Vital signs stable.  On my evaluation, no neurologic deficits.  CT head was ordered and independently  interpreted by me.  No acute findings.  I discussed the patient with Dr. Amada Jupiter, neurology given her age, symptoms, and lack of previous behavioral health prior to this ER visit.  He recommends adding on MR brain with and without.  He recommends calling neurology after MRI results are complete to review the imaging.  The patient's mother is requesting a call back after MRI results are complete.  Patient care transferred to PA  [place] at the end of my shift to follow-up on MRI results and reconsult neurology. Patient presentation, ED course, and plan of care discussed with review of all pertinent labs and  imaging. Please see his/her note for further details regarding further ED course and disposition.        Barkley Boards, PA-C 08/10/20 0923    Glynn Octave, MD 08/10/20 [phone]

## 2020-08-10 NOTE — ED Notes (Signed)
Pt transported to CT by wheelchair.

## 2020-08-10 NOTE — ED Notes (Signed)
Pts mother updated on current condition. Of note during this conversation mother reported that the patient does not have a past medical history psychosis or mental health problems such as the ones she is presenting with at this this. Mother reported that over the last month the patient has been complaining of severe headaches, mother reported "she would have to lock herself in her room, and no one could even talk to her or it would make her want to scream in pain." Mother reports she began demonstrating mental health disturbances and wanted to see if had any scans of her head done.   Information reported to [employer].

## 2020-08-10 NOTE — Progress Notes (Signed)
   08/10/20 1704  Clinical Encounter Type  Visited With Patient  Visit Type Initial  Referral From Nurse  Consult/Referral To Chaplain  Spiritual Encounters  Spiritual Needs Prayer;Emotional  Stress Factors  Patient Stress Factors Health changes;Lack of knowledge   Chaplain responded to page from [employer]. Pt expressed concern over why she was in the ED. Chaplain engaged active listening and provided emotional support. Pt's thought patterns were both clear and not clear at times.  Chaplain prayed with Pt. Chaplain alerted Pt's nurse to Pt's headache per Pt's request. Chaplain remains available.   This note was prepared by Chaplain Resident, Tacy Learn, MDiv. Chaplain remains available as needed through the on-call pager: [phone].

## 2020-08-10 NOTE — ED Notes (Signed)
Pt provided with hygiene items and pt had shower.

## 2020-08-10 NOTE — ED Provider Notes (Signed)
I was asked to IVC this patient.  The patient was also in need of antipsychotics.  She was very disorganized and was a little bit agitated.  She was wandering around the behavioral health unit.  I saw the patient, and she was not overtly destructive, but she was very disorganized and had a very flat affect.  She has been seen by behavioral health, and inpatient treatment with IVC has been recommended due to her erratic behavior and concern for mania and psychosis.   Koleen Distance, MD 08/10/20 [phone]

## 2020-08-10 NOTE — ED Notes (Signed)
I updated pt's mother and sister on pt's condition.

## 2020-08-10 NOTE — ED Notes (Signed)
Pt engaging sitter and nurse in conversation that continues to cover same topics with long pauses between questions and responses with intermittent nonsensical questions. Pt asked "I want a dog, but all dogs do is get euthanized, and the last time I was with my favorite dog it growled at me. Can you help me understand?" Pt asked staff for drink, requesting that it be opened and poured in front of her. Will continue to orient and deescalate pt as needed.

## 2020-08-10 NOTE — ED Notes (Signed)
Pt is asleep with even and non-labored respirations

## 2020-08-10 NOTE — ED Notes (Addendum)
Pt still alert, has not slept. Over the course of the night, pt has repeatedly asked to make phone calls and to go over plan of care, though these conversations have been had with her before. Still calm and receptive to redirection and conversation, but does not seem to be able to sustain understanding and insight into condition, environment, or plan of care. Affect is flat. Does not seem to be responding to external stimuli, but possibly internal, and appears to not trust staff.

## 2020-08-11 ENCOUNTER — Inpatient Hospital Stay (HOSPITAL_COMMUNITY)
Admission: AD | Admit: 2020-08-11 | Discharge: 2020-08-24 | DRG: 885 | Disposition: A | Discharge status: Home / Self Care | Payer: Medicaid Other | Source: Intra-hospital | Attending: Psychiatry | Admitting: Psychiatry

## 2020-08-11 DIAGNOSIS — F1210 Cannabis abuse, uncomplicated: Secondary | ICD-10-CM | POA: Diagnosis present

## 2020-08-11 DIAGNOSIS — R251 Tremor, unspecified: Secondary | ICD-10-CM | POA: Diagnosis not present

## 2020-08-11 DIAGNOSIS — F1120 Opioid dependence, uncomplicated: Secondary | ICD-10-CM | POA: Diagnosis present

## 2020-08-11 DIAGNOSIS — E871 Hypo-osmolality and hyponatremia: Secondary | ICD-10-CM | POA: Diagnosis present

## 2020-08-11 DIAGNOSIS — T43505A Adverse effect of unspecified antipsychotics and neuroleptics, initial encounter: Secondary | ICD-10-CM | POA: Diagnosis not present

## 2020-08-11 DIAGNOSIS — F419 Anxiety disorder, unspecified: Secondary | ICD-10-CM | POA: Diagnosis present

## 2020-08-11 DIAGNOSIS — F17210 Nicotine dependence, cigarettes, uncomplicated: Secondary | ICD-10-CM | POA: Diagnosis present

## 2020-08-11 DIAGNOSIS — N643 Galactorrhea not associated with childbirth: Secondary | ICD-10-CM | POA: Diagnosis not present

## 2020-08-11 DIAGNOSIS — Z833 Family history of diabetes mellitus: Secondary | ICD-10-CM | POA: Diagnosis not present

## 2020-08-11 DIAGNOSIS — F19950 Other psychoactive substance use, unspecified with psychoactive substance-induced psychotic disorder with delusions: Secondary | ICD-10-CM

## 2020-08-11 DIAGNOSIS — Z8249 Family history of ischemic heart disease and other diseases of the circulatory system: Secondary | ICD-10-CM

## 2020-08-11 DIAGNOSIS — I10 Essential (primary) hypertension: Secondary | ICD-10-CM | POA: Diagnosis present

## 2020-08-11 DIAGNOSIS — Z79899 Other long term (current) drug therapy: Secondary | ICD-10-CM

## 2020-08-11 DIAGNOSIS — E876 Hypokalemia: Secondary | ICD-10-CM | POA: Diagnosis present

## 2020-08-11 DIAGNOSIS — F22 Delusional disorders: Secondary | ICD-10-CM | POA: Diagnosis present

## 2020-08-11 DIAGNOSIS — F1190 Opioid use, unspecified, uncomplicated: Secondary | ICD-10-CM | POA: Diagnosis present

## 2020-08-11 DIAGNOSIS — G4700 Insomnia, unspecified: Secondary | ICD-10-CM | POA: Diagnosis present

## 2020-08-11 DIAGNOSIS — F29 Unspecified psychosis not due to a substance or known physiological condition: Secondary | ICD-10-CM | POA: Diagnosis present

## 2020-08-11 DIAGNOSIS — Y92239 Unspecified place in hospital as the place of occurrence of the external cause: Secondary | ICD-10-CM | POA: Diagnosis not present

## 2020-08-11 DIAGNOSIS — Z803 Family history of malignant neoplasm of breast: Secondary | ICD-10-CM | POA: Diagnosis not present

## 2020-08-11 DIAGNOSIS — F329 Major depressive disorder, single episode, unspecified: Secondary | ICD-10-CM | POA: Diagnosis present

## 2020-08-11 DIAGNOSIS — R000 Tachycardia, unspecified: Secondary | ICD-10-CM | POA: Diagnosis not present

## 2020-08-11 DIAGNOSIS — Z91410 Personal history of adult physical and sexual abuse: Secondary | ICD-10-CM

## 2020-08-11 DIAGNOSIS — F23 Brief psychotic disorder: Secondary | ICD-10-CM | POA: Diagnosis not present

## 2020-08-11 DIAGNOSIS — F4310 Post-traumatic stress disorder, unspecified: Secondary | ICD-10-CM | POA: Diagnosis present

## 2020-08-11 DIAGNOSIS — F1111 Opioid abuse, in remission: Secondary | ICD-10-CM | POA: Diagnosis present

## 2020-08-11 MED ORDER — CLONIDINE HCL 0.2 MG PO TABS
0.1000 mg | ORAL_TABLET | Freq: Once | ORAL | Status: AC
  Administered 2020-08-11: 0.1 mg via ORAL
  Filled 2020-08-11: qty 1

## 2020-08-11 MED ORDER — HYDROXYZINE HCL 50 MG PO TABS: 50.0000 mg | ORAL_TABLET | Freq: Three times a day (TID) | ORAL | Status: DC | PRN

## 2020-08-11 MED ORDER — DIPHENHYDRAMINE HCL 25 MG PO CAPS
50.0000 mg | ORAL_CAPSULE | Freq: Once | ORAL | Status: AC
  Administered 2020-08-11: 50 mg via ORAL
  Filled 2020-08-11: qty 2

## 2020-08-11 MED ORDER — OLANZAPINE 5 MG PO TABS
5.0000 mg | ORAL_TABLET | Freq: Every day | ORAL | Status: DC
  Administered 2020-08-11: 5 mg via ORAL
  Filled 2020-08-11 (×2): qty 1

## 2020-08-11 MED ORDER — OLANZAPINE 5 MG PO TABS: 5.0000 mg | ORAL_TABLET | Freq: Every day | ORAL | Status: DC

## 2020-08-11 NOTE — ED Notes (Addendum)
[employer] here to transport pt. Vitals to be obtained. Paperwork, valuables from security, and belongings given to [employer] transport

## 2020-08-11 NOTE — ED Notes (Signed)
Reported to this RN by nursing sitter, that pt had not been sleeping much. This RN has encouraged pt to rest, without success, pt continues to stand in the doorframe and pace in her room. This RN reviewed chart and noted per RN hourly rounding, does not appear pt got sleep last night. This RN made [facility] NP aware of pt not sleeping. Nursing sitter present. Will continue to monitor.

## 2020-08-11 NOTE — BHH Counselor (Signed)
Re-assessment:   TTS re-assessed patient. Patient presented confused and paranoid. When asked how are you feeling patient stated "I don't know, can you tell me how I am feeling." When asked if she knew where she was patient stated, "I been told where I am, [facility]." Patient could not recall why she's in the hospital. Patient denied wanting to herself but stated she feels like someone wants to her hurt. Patient could not expressed why she felt that way stating, "I have been out of it the past couple of days."    Disposition:  Ophelia Shoulder, NP, continue inpatient patient.

## 2020-08-11 NOTE — ED Notes (Signed)
This RN contacted [employer] non emergency to transport pt to [facility]

## 2020-08-11 NOTE — ED Notes (Signed)
Pt lunch tray delivered.

## 2020-08-11 NOTE — BH Assessment (Signed)
Susan English, [facility] at [facility], states Pt has been accepted to room 407-1 after 2200. Pt is accepted to the service of Dr. Jola Babinski. Number for RN report is [phone]. Notified Pieter Partridge, MD and Chana Bode, RN of acceptance.   Pamalee Leyden, [facility], [facility] Triage Specialist [phone]

## 2020-08-11 NOTE — ED Notes (Signed)
Pt mother called requesting to speak to pt and requesting an update on POC. Pt spoke with her mother and mother updated on POC.

## 2020-08-11 NOTE — ED Notes (Signed)
Pt compliant with morning medication regimen with much encouragement. Presents with blank affect.  Pt does not forward much with this RN, appears to be paranoid.Susan English  Pt very slow/delyaed to answer questions. When pt does answer questions only with one word answers.

## 2020-08-11 NOTE — ED Notes (Signed)
This RN gave report to Engineer, manufacturing systems at [facility]

## 2020-08-11 NOTE — ED Notes (Signed)
[employer] transport here to take pt to [facility]. Vitals have been obtained and in chart. Pt IVC paperwork, valuables, and belongings given to [employer].

## 2020-08-11 NOTE — ED Notes (Signed)
Tele psych machine to bedside  

## 2020-08-11 NOTE — ED Notes (Addendum)
This RN to pt room to assist NT in obtaining EKG, This RN at room door. Pt behavior bizarre, "Why are you trying to be like me." This RN asked for clarification; Pt then points to this RN badge, "Your not Susan English, My name is Susan English. " attempted to explain to pt that we share the same name, "Your not Susan English, your Susan English."  Pt refusing to allow nursing staff to obtain EKG at this time. Will notify ordering provider.  Will continue to monitor.

## 2020-08-11 NOTE — ED Notes (Signed)
Pt changed into clean scrub shirt

## 2020-08-12 ENCOUNTER — Encounter (HOSPITAL_COMMUNITY): Payer: Self-pay | Admitting: Psychiatry

## 2020-08-12 ENCOUNTER — Other Ambulatory Visit: Payer: Self-pay

## 2020-08-12 DIAGNOSIS — F1111 Opioid abuse, in remission: Secondary | ICD-10-CM | POA: Insufficient documentation

## 2020-08-12 DIAGNOSIS — F23 Brief psychotic disorder: Secondary | ICD-10-CM

## 2020-08-12 LAB — GLUCOSE, CAPILLARY: Glucose-Capillary: 104 mg/dL — ABNORMAL HIGH (ref 70–99)

## 2020-08-12 MED ORDER — ZIPRASIDONE MESYLATE 20 MG IM SOLR
20.0000 mg | INTRAMUSCULAR | Status: AC | PRN
  Administered 2020-08-13: 20 mg via INTRAMUSCULAR
  Filled 2020-08-12 (×2): qty 20

## 2020-08-12 MED ORDER — LORAZEPAM 1 MG PO TABS
1.0000 mg | ORAL_TABLET | ORAL | Status: DC | PRN
  Filled 2020-08-12: qty 1

## 2020-08-12 MED ORDER — OLANZAPINE 5 MG PO TBDP
5.0000 mg | ORAL_TABLET | Freq: Two times a day (BID) | ORAL | Status: DC
  Administered 2020-08-12: 5 mg via ORAL
  Filled 2020-08-12 (×5): qty 1

## 2020-08-12 MED ORDER — NICOTINE 21 MG/24HR TD PT24
21.0000 mg | MEDICATED_PATCH | Freq: Every day | TRANSDERMAL | Status: DC
  Administered 2020-08-12 – 2020-08-13 (×2): 21 mg via TRANSDERMAL
  Filled 2020-08-12 (×8): qty 1

## 2020-08-12 MED ORDER — LORAZEPAM 1 MG PO TABS
1.0000 mg | ORAL_TABLET | ORAL | Status: AC | PRN
  Administered 2020-08-12: 1 mg via ORAL
  Filled 2020-08-12: qty 1

## 2020-08-12 MED ORDER — HYDROXYZINE HCL 25 MG PO TABS
25.0000 mg | ORAL_TABLET | Freq: Three times a day (TID) | ORAL | Status: DC | PRN
  Administered 2020-08-12 – 2020-08-23 (×13): 25 mg via ORAL
  Filled 2020-08-12 (×13): qty 1

## 2020-08-12 MED ORDER — BUPRENORPHINE HCL-NALOXONE HCL 2-0.5 MG SL SUBL
2.0000 | SUBLINGUAL_TABLET | Freq: Three times a day (TID) | SUBLINGUAL | Status: DC
  Administered 2020-08-12 – 2020-08-18 (×19): 2 via SUBLINGUAL
  Filled 2020-08-12 (×21): qty 2

## 2020-08-12 MED ORDER — ACETAMINOPHEN 325 MG PO TABS
650.0000 mg | ORAL_TABLET | Freq: Four times a day (QID) | ORAL | Status: DC | PRN
  Administered 2020-08-18 – 2020-08-21 (×4): 650 mg via ORAL
  Filled 2020-08-12 (×4): qty 2

## 2020-08-12 MED ORDER — ZIPRASIDONE HCL 20 MG PO CAPS
20.0000 mg | ORAL_CAPSULE | ORAL | Status: DC | PRN
  Filled 2020-08-12: qty 1

## 2020-08-12 MED ORDER — LORAZEPAM 2 MG/ML IJ SOLN
1.0000 mg | INTRAMUSCULAR | Status: DC | PRN
  Administered 2020-08-12: 1 mg via INTRAMUSCULAR
  Filled 2020-08-12: qty 1

## 2020-08-12 MED ORDER — OLANZAPINE 5 MG PO TBDP: 5.0000 mg | ORAL_TABLET | Freq: Three times a day (TID) | ORAL | Status: DC | PRN

## 2020-08-12 MED ORDER — HYDROCHLOROTHIAZIDE 50 MG PO TABS
50.0000 mg | ORAL_TABLET | Freq: Every morning | ORAL | Status: DC
  Administered 2020-08-12 – 2020-08-13 (×2): 50 mg via ORAL
  Filled 2020-08-12 (×3): qty 1

## 2020-08-12 MED ORDER — POTASSIUM CHLORIDE CRYS ER 20 MEQ PO TBCR
40.0000 meq | EXTENDED_RELEASE_TABLET | Freq: Once | ORAL | Status: AC
  Administered 2020-08-12: 40 meq via ORAL
  Filled 2020-08-12 (×2): qty 2

## 2020-08-12 MED ORDER — ZIPRASIDONE MESYLATE 20 MG IM SOLR
20.0000 mg | INTRAMUSCULAR | Status: DC | PRN
  Administered 2020-08-12: 20 mg via INTRAMUSCULAR
  Filled 2020-08-12: qty 20

## 2020-08-12 MED ORDER — ALUM & MAG HYDROXIDE-SIMETH 200-200-20 MG/5ML PO SUSP: 30.0000 mL | ORAL | Status: DC | PRN

## 2020-08-12 MED ORDER — MAGNESIUM HYDROXIDE 400 MG/5ML PO SUSP
30.0000 mL | Freq: Every day | ORAL | Status: DC | PRN
  Administered 2020-08-24: 30 mL via ORAL
  Filled 2020-08-12: qty 30

## 2020-08-12 MED ORDER — OLANZAPINE 5 MG PO TBDP
5.0000 mg | ORAL_TABLET | Freq: Three times a day (TID) | ORAL | Status: DC | PRN
  Administered 2020-08-12 – 2020-08-13 (×2): 5 mg via ORAL
  Filled 2020-08-12: qty 1

## 2020-08-12 MED ORDER — TRAZODONE HCL 50 MG PO TABS
50.0000 mg | ORAL_TABLET | Freq: Every evening | ORAL | Status: DC | PRN
  Administered 2020-08-12 – 2020-08-23 (×10): 50 mg via ORAL
  Filled 2020-08-12 (×12): qty 1

## 2020-08-12 NOTE — Progress Notes (Signed)
Recreation Therapy Notes  Date: 4.4.22 Time: 1000 Location: 500 Hall Dayroom  Group Topic: Anxiety  Goal Area(s) Addresses:  Patient will identify triggers to anxiety.  Patient will identify physical symptoms to anxiety. Patient will identify coping skills to use when feeling anxious.   Intervention: Worksheet, Pencils  Activity: Introduction to Anxiety.  Patients were to identify the things that trigger their anxiety.  Patients would also identify symptoms (physical and mental) they experience when anxious and the coping skills they use to deal with it.  Education: Communication, Discharge Planning  Education Outcome: Acknowledges understanding/In group clarification offered/Needs additional education.   Clinical Observations/Feedback: Pt did not attend group session.    Xander Jutras, LRT/CTRS         [place] Skalicky A 08/12/2020 11:36 AM 
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
## 2020-08-12 NOTE — Progress Notes (Signed)
Recreation Therapy Notes  INPATIENT RECREATION THERAPY ASSESSMENT  Patient Details Name: Susan English MRN: [id number] DOB: 02/27/1991 Today's Date: 08/12/2020       Information Obtained From: Patient  Able to Participate in Assessment/Interview: Yes  Patient Presentation: Withdrawn (Flat)  Reason for Admission (Per Patient): Med Non-Compliance,Other (Comments) (Anxiety, Time Management per chart)  Patient Stressors: Other (Comment) (Life in general)  Coping Skills:   Isolation,Write,TV,Music,Meditate,Deep Presenter, broadcasting  Leisure Interests (2+):  Individual - Other (Comment) (Chill)  Frequency of Recreation/Participation: Other (Comment) (Not often)  Awareness of Community Resources:  Yes  Community Resources:  Restaurants,Mall  Current Use: Yes  If no, Barriers?:    Expressed Interest in [employer] Information: No  County of Residence:  [place]  Patient Main Form of Transportation: Car  Patient Strengths:  Friendly; Adaptability  Patient Identified Areas of Improvement:  Did not answer  Patient Goal for Hospitalization:  Did not answer  Current SI (including self-harm):   ("I don't know")  Current HI:   (Did not answer)  Current AVH:  (Did not answer)  Staff Intervention Plan: Group Attendance,Collaborate with Interdisciplinary Treatment Team  Consent to Intern Participation: N/A     Caroll Rancher, LRT/CTRS  Lillia Abed, [name] A 08/12/2020, 1:15 PM

## 2020-08-12 NOTE — Progress Notes (Signed)
Progress note    08/12/20 0821  Psych Admission Type (Psych Patients Only)  Admission Status Involuntary  Psychosocial Assessment  Patient Complaints Anxiety;Isolation;Worrying  Eye Contact Intense;Watchful  Facial Expression Anxious;Pensive  Affect Anxious;Preoccupied  Speech Logical/coherent;Elective mutism  Interaction Cautious;Forwards little;Guarded;Minimal  Motor Activity Slow  Appearance/Hygiene Unremarkable  Behavior Characteristics Cooperative;Anxious;Guarded  Mood Anxious;Suspicious;Preoccupied;Pleasant  Thought Process  Coherency Blocking  Content Paranoia  Delusions Paranoid  Perception WDL  Hallucination None reported or observed  Judgment Poor  Confusion Mild  Danger to Self  Current suicidal ideation? Denies  Danger to Others  Danger to Others None reported or observed

## 2020-08-12 NOTE — Plan of Care (Signed)

## 2020-08-12 NOTE — Progress Notes (Incomplete)
Patient Pacing up and down the hall way very aggitated. Asking for her mother. C/O I am afraid I cannot stay in my room. Dissociating, staring with wide eyes spacing out.  Provider notified and started on agitation protocol Prn ativan PO and Geodon PO offered to Patient with a lot of encouragement, show of support Patient still non compliant . Patient escorted to her room Ativan 1 mg IM and Geodon 20 mg IM given at 0530.

## 2020-08-12 NOTE — Progress Notes (Signed)
Patient admitted involuntarily for her erratic and bizarre behaviors. She reports that she is just overwhelmed with life. She has 2 children, 32 years old son and 25 year old daughter. She was off medication for 3 days she reported. She last used CBD and THC on 3/31. Medical hx of HTN. She reports that she has a rod and 6 screw in her right ankle and left knee swells. She denies si/hi/ auditory and visual hallucinations. Skin assessment completed, meal given and oriented to unit.
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
## 2020-08-12 NOTE — Tx Team (Signed)
Initial Treatment Plan 08/12/2020 12:47 AM Deirdre Evener PTW:[id number]    PATIENT STRESSORS: Medication change or noncompliance Substance abuse Other: Patient reports feeling overwhelmed with life in general   PATIENT STRENGTHS: Ability for insight Active sense of humor Average or above average intelligence Capable of independent living Motivation for treatment/growth Supportive family/friends Work skills   PATIENT IDENTIFIED PROBLEMS: Anxiety  Non compliant with medications  THC and CBD oil user      " Time Management"           DISCHARGE CRITERIA:  Improved stabilization in mood, thinking, and/or behavior  PRELIMINARY DISCHARGE PLAN: Return to previous living arrangement  PATIENT/FAMILY INVOLVEMENT: This treatment plan has been presented to and reviewed with the patient, Susan English, and/or family member.  The patient and family have been given the opportunity to ask questions and make suggestions.  Floyce Stakes, RN 08/12/2020, 12:47 AM

## 2020-08-12 NOTE — Tx Team (Signed)
Interdisciplinary Treatment and Diagnostic Plan Update  08/12/2020 Time of Session: 9:45am Susan English MRN: [id number]  Principal Diagnosis: Brief psychotic disorder [facility])  Secondary Diagnoses: Principal Problem:   Brief psychotic disorder ([place]) Active Problems:   History of opioid abuse ([place])   Current Medications:  Current Facility-Administered Medications  Medication Dose Route Frequency Provider Last Rate Last Admin  . acetaminophen (TYLENOL) tablet 650 mg  650 mg Oral Q6H PRN Prescilla Sours, PA-C      . alum & mag hydroxide-simeth (MAALOX/MYLANTA) 200-200-20 MG/5ML suspension 30 mL  30 mL Oral Q4H PRN Margorie John W, PA-C      . buprenorphine-naloxone (SUBOXONE) 2-0.5 mg per SL tablet 2 tablet  2 tablet Sublingual TID WC & HS Ethelene Hal, NP   2 tablet at 08/12/20 1306  . hydrochlorothiazide (HYDRODIURIL) tablet 50 mg  50 mg Oral q morning Prescilla Sours, PA-C   50 mg at 08/12/20 4098  . hydrOXYzine (ATARAX/VISTARIL) tablet 25 mg  25 mg Oral TID PRN Prescilla Sours, PA-C   25 mg at 08/12/20 0056  . OLANZapine zydis (ZYPREXA) disintegrating tablet 5 mg  5 mg Oral Q8H PRN Ethelene Hal, NP       And  . LORazepam (ATIVAN) tablet 1 mg  1 mg Oral PRN Ethelene Hal, NP       And  . ziprasidone (GEODON) injection 20 mg  20 mg Intramuscular PRN Ethelene Hal, NP      . magnesium hydroxide (MILK OF MAGNESIA) suspension 30 mL  30 mL Oral Daily PRN Margorie John W, PA-C      . nicotine (NICODERM CQ - dosed in mg/24 hours) patch 21 mg  21 mg Transdermal Daily Sharma Covert, MD   21 mg at 08/12/20 0820  . OLANZapine zydis (ZYPREXA) disintegrating tablet 5 mg  5 mg Oral BID Ethelene Hal, NP   5 mg at 08/12/20 1306  . traZODone (DESYREL) tablet 50 mg  50 mg Oral QHS PRN Prescilla Sours, PA-C       PTA Medications: Medications Prior to Admission  Medication Sig Dispense Refill Last Dose  . cloNIDine (CATAPRES) 0.1 MG tablet Take 0.2 mg by mouth at  bedtime.   Past Week at Unknown time  . hydrochlorothiazide (HYDRODIURIL) 25 MG tablet Take 50 mg by mouth every morning.   Past Week at Unknown time  . traZODone (DESYREL) 50 MG tablet Take 50-100 mg by mouth at bedtime as needed for sleep.   Past Week at Unknown time  . ACCU-CHEK FASTCLIX LANCETS MISC 1 Device by Does not apply route 4 (four) times daily. (Patient not taking: No sig reported) 100 each 2 Unknown at Unknown time  . cyclobenzaprine (FLEXERIL) 5 MG tablet Take 1 tablet (5 mg total) by mouth every 8 (eight) hours as needed for muscle spasms. (Patient not taking: No sig reported) 20 tablet 0 Unknown at Unknown time  . diphenhydrAMINE (BENADRYL) 25 MG tablet Take 25 mg by mouth as needed for allergies.   Unknown at Unknown time  . glucose blood (ACCU-CHEK GUIDE) test strip Use as instructed QID (Patient not taking: No sig reported) 100 each 12 Unknown at Unknown time  . hydrOXYzine (ATARAX/VISTARIL) 10 MG tablet Take 1 tablet (10 mg total) by mouth 3 (three) times daily as needed. (Patient not taking: No sig reported) 30 tablet 2 Unknown at Unknown time  . hydrOXYzine (VISTARIL) 50 MG capsule Take 50 mg by mouth 2 (two)  times daily as needed for anxiety.   Unknown at Unknown time  . prenatal vitamin w/FE, FA (PRENATAL 1 + 1) 27-1 MG TABS tablet Take 1 tablet by mouth daily at 12 noon. (Patient not taking: No sig reported) 30 each 9 Unknown at Unknown time  . SUBOXONE 8-2 MG FILM Place 1.5 Film under the tongue 4 (four) times daily.   Unknown at Unknown time    Patient Stressors: Medication change or noncompliance Substance abuse Other: Patient reports feeling overwhelmed with life in general  Patient Strengths: Ability for insight Active sense of humor Average or above average intelligence Capable of independent living Motivation for treatment/growth Supportive family/friends Work skills  Treatment Modalities: Medication Management, Group therapy, Case management,  1 to 1  session with clinician, Psychoeducation, Recreational therapy.   Physician Treatment Plan for Primary Diagnosis: Brief psychotic disorder ([place]) Long Term Goal(s): Improvement in symptoms so as ready for discharge Improvement in symptoms so as ready for discharge   Short Term Goals: Compliance with prescribed medications will improve Ability to identify changes in lifestyle to reduce recurrence of condition will improve Ability to verbalize feelings will improve Ability to identify and develop effective coping behaviors will improve Compliance with prescribed medications will improve Ability to identify triggers associated with substance abuse/mental health issues will improve  Medication Management: Evaluate patient's response, side effects, and tolerance of medication regimen.  Therapeutic Interventions: 1 to 1 sessions, Unit Group sessions and Medication administration.  Evaluation of Outcomes: Not Met  Physician Treatment Plan for Secondary Diagnosis: Principal Problem:   Brief psychotic disorder ([place]) Active Problems:   History of opioid abuse ([place])  Long Term Goal(s): Improvement in symptoms so as ready for discharge Improvement in symptoms so as ready for discharge   Short Term Goals: Compliance with prescribed medications will improve Ability to identify changes in lifestyle to reduce recurrence of condition will improve Ability to verbalize feelings will improve Ability to identify and develop effective coping behaviors will improve Compliance with prescribed medications will improve Ability to identify triggers associated with substance abuse/mental health issues will improve     Medication Management: Evaluate patient's response, side effects, and tolerance of medication regimen.  Therapeutic Interventions: 1 to 1 sessions, Unit Group sessions and Medication administration.  Evaluation of Outcomes: Not Met   RN Treatment Plan for Primary Diagnosis: Brief psychotic  disorder ([place]) Long Term Goal(s): Knowledge of disease and therapeutic regimen to maintain health will improve  Short Term Goals: Ability to participate in decision making will improve, Ability to verbalize feelings will improve, Ability to identify and develop effective coping behaviors will improve and Compliance with prescribed medications will improve  Medication Management: RN will administer medications as ordered by provider, will assess and evaluate patient's response and provide education to patient for prescribed medication. RN will report any adverse and/or side effects to prescribing provider.  Therapeutic Interventions: 1 on 1 counseling sessions, Psychoeducation, Medication administration, Evaluate responses to treatment, Monitor vital signs and CBGs as ordered, Perform/monitor CIWA, COWS, AIMS and Fall Risk screenings as ordered, Perform wound care treatments as ordered.  Evaluation of Outcomes: Not Met   LCSW Treatment Plan for Primary Diagnosis: Brief psychotic disorder [facility]) Long Term Goal(s): Safe transition to appropriate next level of care at discharge, Engage patient in therapeutic group addressing interpersonal concerns.  Short Term Goals: Engage patient in aftercare planning with referrals and resources, Increase ability to appropriately verbalize feelings, Increase emotional regulation, Facilitate acceptance of mental health diagnosis and concerns, Identify  triggers associated with mental health/substance abuse issues and Increase skills for wellness and recovery  Therapeutic Interventions: Assess for all discharge needs, 1 to 1 time with Social worker, Explore available resources and support systems, Assess for adequacy in community support network, Educate family and significant other(s) on suicide prevention, Complete Psychosocial Assessment, Interpersonal group therapy.  Evaluation of Outcomes: Not Met   Progress in Treatment: Attending groups: No. Participating  in groups: No. Taking medication as prescribed: Yes. Toleration medication: Yes. Family/Significant other contact made: No, will contact:  Romelle Starcher, mother Patient understands diagnosis: No. Discussing patient identified problems/goals with staff: No. Medical problems stabilized or resolved: Yes. Denies suicidal/homicidal ideation: Yes. Issues/concerns per patient self-inventory: No.   New problem(s) identified: No, Describe:  none reported  New Short Term/Long Term Goal(s): medication management for mood stabilization; elimination of SI thoughts; development of comprehensive mental wellness/sobriety plan   Patient Goals:  "To go home"  Discharge Plan or Barriers: Patient recently admitted. CSW will continue to follow and assess for appropriate referrals and possible discharge planning.    Reason for Continuation of Hospitalization: Depression Mania Medication stabilization Other; describe disorganized thoughts   Estimated Length of Stay: 3-5 days  Attendees: Patient: Susan English 08/12/2020   Physician: Ethelene Browns, MD 08/12/2020   Nursing:  08/12/2020   RN Care Manager: 08/12/2020   Social Worker: Riki Altes, LCSW 08/12/2020   Recreational Therapist:  08/12/2020   Other:  08/12/2020   Other:  08/12/2020   Other: 08/12/2020       Scribe for Treatment Team: Zachery Conch, LCSW 08/12/2020 2:28 PM
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
## 2020-08-12 NOTE — [facility] Group Notes (Signed)
LCSW Group Therapy Notes  Type of Therapy and Topic: Group Therapy: Healthy Vs. Unhealthy Coping Strategies  Date and Time:   Participation Level: [facility] PARTICIPATION LEVEL: Did Not Attend  Description of Group:  In this group, patients will be encouraged to explore their healthy and unhealthy coping strategics. Coping strategies are actions that we take to deal with stress, problems, or uncomfortable emotions in our daily lives. Each patient will be challenged to read some scenarios and discuss the unhealthy and healthy coping strategies within those scenarios. Also, each patient will be challenged to describe current healthy and unhealthy strategies that they use in their own lives and discuss the outcomes and barriers to those strategies. This group will be process-oriented, with patients participating in exploration of their own experiences as well as giving and receiving support and challenge from other group members.  Therapeutic Goals: 1. Patient will identify personal healthy and unhealthy coping strategies. 2. Patient will identify healthy and unhealthy coping strategies, in others, through scenarios.  3. Patient will identify expected outcomes of healthy and unhealthy coping strategies. 4. Patient will identify barriers to using healthy coping strategies.   Summary of Patient Progress: Did not attend   Therapeutic Modalities: Cognitive Behavioral Therapy Solution Focused Therapy Motivational Interviewing    Jacobi Ryant MSW, LCSW Clincal Social Worker  [facility]  

## 2020-08-12 NOTE — Progress Notes (Signed)
   08/11/20 2340  COVID-19 Daily Checkoff  Have you had a fever (temp > 37.80C/100F)  in the past 24 hours?  No  If you have had runny nose, nasal congestion, sneezing in the past 24 hours, has it worsened? No  COVID-19 EXPOSURE  Have you traveled outside the state in the past 14 days? No  Have you been in contact with someone with a confirmed diagnosis of COVID-19 or PUI in the past 14 days without wearing appropriate PPE? No  Have you been living in the same home as a person with confirmed diagnosis of COVID-19 or a PUI (household contact)? No  Have you been diagnosed with COVID-19? No

## 2020-08-12 NOTE — [facility] Suicide Risk Assessment (Signed)
[facility] INPATIENT:  Family/Significant Other Suicide Prevention Education  Suicide Prevention Education: Education Completed; mother, Susan English [phone]), has been identified by the patient as the family member/significant other with whom the patient will be residing, and identified as the person(s) who will aid the patient in the event of a mental health crisis (suicidal ideations/suicide attempt).  With written consent from the patient, the family member/significant other has been provided the following suicide prevention education, prior to the and/or following the discharge of the patient.  The suicide prevention education provided includes the following:  Suicide risk factors  Suicide prevention and interventions  National Suicide Hotline telephone number  [facility] assessment telephone number  [facility] Emergency Assistance 911  [facility] and/or Residential Mobile Crisis Unit telephone number   Request made of family/significant other to:  Remove weapons (e.g., guns, rifles, knives), all items previously/currently identified as safety concern.    Remove drugs/medications (over-the-counter, prescriptions, illicit drugs), all items previously/currently identified as a safety concern.   The family member/significant other verbalizes understanding of the suicide prevention education information provided.  The family member/significant other agrees to remove the items of safety concern listed above.  CSW spoke with this patients mother who reported that this patient recently became delusional believing that she was sent by god to save the world. Has not been sleeping, has been pacing, walking around, confused and paranoid.   Her mother states that she had been using other peoples percocet. She is currently being prescribed Suboxone, Hydroxyzine, Trazodone and an anti-depressant her mother could not recall the name of. She  Stopped taking her antidepressant  and sleep medications about 2 days prior to coming to the hospital.  Mother stated that this patient is able to return home when she discharges from the hospital.    Susan English MSW, LCSW Clincal Social Worker  [facility]

## 2020-08-13 MED ORDER — HYDROCHLOROTHIAZIDE 25 MG PO TABS
25.0000 mg | ORAL_TABLET | Freq: Every morning | ORAL | Status: DC
  Administered 2020-08-15 – 2020-08-21 (×7): 25 mg via ORAL
  Filled 2020-08-13 (×12): qty 1

## 2020-08-13 MED ORDER — OLANZAPINE 10 MG PO TBDP
10.0000 mg | ORAL_TABLET | Freq: Two times a day (BID) | ORAL | Status: DC
  Administered 2020-08-13: 10 mg via ORAL
  Filled 2020-08-13 (×4): qty 1

## 2020-08-13 MED ORDER — POTASSIUM CHLORIDE CRYS ER 20 MEQ PO TBCR
20.0000 meq | EXTENDED_RELEASE_TABLET | Freq: Two times a day (BID) | ORAL | Status: AC
  Administered 2020-08-13: 20 meq via ORAL
  Filled 2020-08-13 (×2): qty 1

## 2020-08-13 MED ORDER — OLANZAPINE 5 MG PO TBDP
5.0000 mg | ORAL_TABLET | ORAL | Status: AC
  Administered 2020-08-13: 5 mg via ORAL

## 2020-08-13 NOTE — Progress Notes (Signed)
Patient tried to eloped when staff went off the unit and off the 500 hall. She had to be escorted back onto the unit. She requires frequent redirection to stay on task.

## 2020-08-13 NOTE — Progress Notes (Signed)
[facility] MD Progress Note  08/13/2020 12:57 PM Susan English  MRN:  [id number] Subjective: Patient is a 30 year old female who originally presented to the [facility] emergency department on 08/04/2020 with disorganization, rambling speech and tangential thoughts.  Objective: Patient is seen and examined.  Patient is a 30 year old female with the above-stated past psychiatric history who was admitted here on 08/12/2020 having been at the [facility] emergency department for several days.  She apparently has no previous psychiatric history outside of opioid dependence and is on Suboxone on a daily basis.  She was noted to be tangential, have thought blocking, and paranoid.  She was started on Zyprexa 5 mg p.o. twice daily yesterday.  On examination today she still is significantly paranoid.  She is pacing the hallways.  She is staring at the door attempting to elope.  She has thought blocking.  She is able to speak occasionally, but will stare at you inappropriately.  She remains paranoid, but also hyper religious.  She is able to talk about her control religiously about the current circumstances.  She did not sleep well last night.  She got about 2 hours of sleep.  Her drug screen on admission was positive for marijuana.  She denied any suicidal or homicidal ideation, but she has so much thought blocking it is difficult to tell.  Review of her admission laboratories revealed a mildly low sodium at 133, a mildly low potassium at 3.3.  Blood sugar was 128.  Liver function enzymes were normal.  CBC had a mildly elevated white blood cell count at 13.9.  Otherwise negative.  Acetaminophen was less than 10, salicylate less than 7.  Beta-hCG was negative.  Respiratory panel including influenza A, B and coronavirus were all negative.  Blood alcohol was less than 10.  Drug screen was positive for marijuana.  She had a CT scan of the head that was obtained that was essentially negative.  RI with  contrast obtained on 08/10/2020 that was again essentially negative.  Her blood pressure in the chart was 103/80.  She was mildly tachycardic with a rate of 115.  Pulse oximetry on room air was 100%.  Principal Problem: Brief psychotic disorder (HCC) Diagnosis: Principal Problem:   Brief psychotic disorder (HCC) Active Problems:   History of opioid abuse (HCC)  Total Time spent with patient: 20 minutes  Past Psychiatric History: See admission H&P  Past Medical History:  Past Medical History:  Diagnosis Date  . Headache(784.0)   . 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 Hypertension   . Obesity     Past Surgical History:  Procedure Laterality Date  . CESAREAN SECTION    . ORIF ANKLE FRACTURE Right 12/22/2012   Procedure: OPEN REDUCTION INTERNAL FIXATION (ORIF) ANKLE FRACTURE;  Surgeon: Mable Paris, MD;  Location: [facility];  Service: Orthopedics;  Laterality: Right;  . sweat gland     Family History:  Family History  Problem Relation Age of Onset  . Hypertension Mother   . Diabetes Mother   . Diabetes Maternal Grandmother   . Breast cancer Paternal Grandmother    Family Psychiatric  History: See admission H&P Social History:  Social History   Substance and Sexual Activity  Alcohol Use No   Comment: occ     Social History   Substance and Sexual Activity  Drug Use Yes  . Types: Marijuana, Other-see comments   Comment: pt reports  CBD use also    Social History   Socioeconomic History  . Marital status: Single    Spouse name: n/a  . Number of children: 2  . Years of education: Not on file  . Highest education level: Associate degree: occupational, Scientist, product/process development, or vocational program  Occupational History  . Not on file  Tobacco Use  . Smoking status: Current Every Day Smoker    Packs/day: 0.50    Types: Cigarettes  . Smokeless tobacco: Never Used  Vaping Use  . Vaping Use: Never used  Substance  and Sexual Activity  . Alcohol use: No    Comment: occ  . Drug use: Yes    Types: Marijuana, Other-see comments    Comment: pt reports CBD use also  . Sexual activity: Yes    Birth control/protection: Implant  Other Topics Concern  . Not on file  Social History Narrative  . Not on file   Social Determinants of Health   Financial Resource Strain: Not on file  Food Insecurity: Not on file  Transportation Needs: Not on file  Physical Activity: Not on file  Stress: Not on file  Social Connections: Not on file   Additional Social History:                         Sleep: Poor  Appetite:  Fair  Current Medications: Current Facility-Administered Medications  Medication Dose Route Frequency Provider Last Rate Last Admin  . acetaminophen (TYLENOL) tablet 650 mg  650 mg Oral Q6H PRN Jaclyn Shaggy, PA-C      . alum & mag hydroxide-simeth (MAALOX/MYLANTA) 200-200-20 MG/5ML suspension 30 mL  30 mL Oral Q4H PRN Melbourne Abts W, PA-C      . buprenorphine-naloxone (SUBOXONE) 2-0.5 mg per SL tablet 2 tablet  2 tablet Sublingual TID WC & HS Laveda Abbe, NP   2 tablet at 08/13/20 1243  . hydrochlorothiazide (HYDRODIURIL) tablet 50 mg  50 mg Oral q morning Jaclyn Shaggy, PA-C   50 mg at 08/13/20 7510  . hydrOXYzine (ATARAX/VISTARIL) tablet 25 mg  25 mg Oral TID PRN Jaclyn Shaggy, PA-C   25 mg at 08/13/20 1130  . magnesium hydroxide (MILK OF MAGNESIA) suspension 30 mL  30 mL Oral Daily PRN Melbourne Abts W, PA-C      . nicotine (NICODERM CQ - dosed in mg/24 hours) patch 21 mg  21 mg Transdermal Daily Antonieta Pert, MD   21 mg at 08/13/20 1059  . OLANZapine zydis (ZYPREXA) disintegrating tablet 10 mg  10 mg Oral BID Antonieta Pert, MD      . OLANZapine zydis [employer]) disintegrating tablet 5 mg  5 mg Oral Q8H PRN Laveda Abbe, NP   5 mg at 08/13/20 2585   And  . ziprasidone (GEODON) injection 20 mg  20 mg Intramuscular PRN Laveda Abbe, NP      .  traZODone (DESYREL) tablet 50 mg  50 mg Oral QHS PRN Jaclyn Shaggy, PA-C   50 mg at 08/12/20 2046    Lab Results:  Results for orders placed or performed during the hospital encounter of 08/11/20 (from the past 48 hour(s))  Glucose, capillary     Status: Abnormal   Collection Time: 08/12/20 12:00 AM  Result Value Ref Range   Glucose-Capillary 104 (H) 70 - 99 mg/dL    Comment: Glucose reference range applies only to samples taken after fasting for at least 8 hours.  Blood Alcohol level:  Lab Results  Component Value Date   ETH <10 08/08/2020    Metabolic Disorder Labs: No results found for: HGBA1C, MPG No results found for: PROLACTIN No results found for: CHOL, TRIG, HDL, CHOLHDL, VLDL, LDLCALC  Physical Findings: AIMS: Facial and Oral Movements Muscles of Facial Expression: None, normal Lips and Perioral Area: None, normal Jaw: None, normal Tongue: None, normal,Extremity Movements Upper (arms, wrists, hands, fingers): None, normal Lower (legs, knees, ankles, toes): None, normal, Trunk Movements Neck, shoulders, hips: None, normal, Overall Severity Severity of abnormal movements (highest score from questions above): None, normal Incapacitation due to abnormal movements: None, normal Patient's awareness of abnormal movements (rate only patient's report): No Awareness, Dental Status Current problems with teeth and/or dentures?: No Does patient usually wear dentures?: No  CIWA:    COWS:     Musculoskeletal: Strength & Muscle Tone: within normal limits Gait & Station: normal Patient leans: N/A  Psychiatric Specialty Exam:  Presentation  General Appearance: Fairly Groomed  Eye Contact:Fleeting  Speech:Blocked  Speech Volume:Decreased  Handedness:Right   Mood and Affect  Mood:Dysphoric; Irritable  Affect:Flat   Thought Process  Thought Processes:Disorganized  Descriptions of Associations:Loose  Orientation:Partial  Thought Content:Delusions;  Illogical; Paranoid Ideation  History of Schizophrenia/Schizoaffective disorder:No  Duration of Psychotic Symptoms:Less than six months  Hallucinations:Hallucinations: Auditory Description of Auditory Hallucinations: voices talking about me  Ideas of Reference:Delusions; Paranoia; Other (comment)  Suicidal Thoughts:Suicidal Thoughts: No  Homicidal Thoughts:Homicidal Thoughts: No   Sensorium  Memory:Immediate Poor; Recent Poor; Remote Poor  Judgment:Impaired  Insight:Lacking   Executive Functions  Concentration:Poor  Attention Span:Poor  Recall:Poor  Fund of Knowledge:Poor  Language:Poor   Psychomotor Activity  Psychomotor Activity:Psychomotor Activity: Increased   Assets  Assets:Desire for Improvement; Resilience   Sleep  Sleep:Sleep: Poor Number of Hours of Sleep: 2    Physical Exam: Physical Exam Vitals and nursing note reviewed.  HENT:     Head: Normocephalic and atraumatic.  Pulmonary:     Effort: Pulmonary effort is normal.  Neurological:     General: No focal deficit present.     Mental Status: She is alert.    ROS Blood pressure 103/80, pulse (!) 115, temperature 99 F (37.2 C), temperature source Oral, resp. rate 18, height 5\' 8"  (1.727 m), weight 124.7 kg, last menstrual period 07/18/2020, SpO2 100 %, unknown if currently breastfeeding. Body mass index is 41.81 kg/m.   Treatment Plan Summary: Daily contact with patient to assess and evaluate symptoms and progress in treatment, Medication management and Plan : Patient is seen and examined.  Patient is a 30 year old female with the above-stated past psychiatric history who is seen in follow-up.   Diagnosis: 1.  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  Opiate dependence on maintenance therapy 3.  Hypertension 4.  Obesity 5.  History of pregnancy-induced diabetes 6.  Hyponatremia most likely secondary to diuretic treatment. 7.  Hypokalemia  secondary to diuretic treatment. 8.  Hyperchloremia most likely secondary to diuretic treatment.  Pertinent findings on examination today: 1.  Patient is significantly paranoid and hyper religious. 2.  She appears to be responding to internal stimuli. 3.  Poor sleep.  Plan: 1.  Increase Zyprexa to 10 mg p.o. twice daily for psychosis and mood stability. 2.  Continue Suboxone 4 mg / 1 mg sublingual 3 times daily for opiate dependence 3.  Repeat CBC. 4.  Continue hydrochlorothiazide but decreased dosage to 25 mg p.o. daily for hypertension. 5.  Repeat chemistries when possible.  6.  We will give KCl 20 mEq p.o. x2 for hypokalemia. 7.  Continue olanzapine agitation protocol as needed. 8.  Continue trazodone 50 mg p.o. nightly as needed insomnia. 9.  Disposition planning-in progress. Antonieta PertGreg Lawson Edrian Melucci, MD 08/13/2020, 12:57 PM

## 2020-08-13 NOTE — Progress Notes (Signed)
Pt appears confused at times, pt needs much redirection to take medications and to follow directions. Pt given PRN Trazodone and Vistaril with HS medications    08/13/20 2200  Psych Admission Type (Psych Patients Only)  Admission Status Involuntary  Psychosocial Assessment  Patient Complaints Suspiciousness;Disorientation  Eye Contact Intense;Watchful  Facial Expression Anxious;Pensive  Affect Anxious;Preoccupied  Speech Logical/coherent;Elective mutism  Interaction Cautious;Forwards little;Guarded;Minimal  Motor Activity Slow  Appearance/Hygiene Unremarkable  Behavior Characteristics Wandering risk;Anxious  Mood Suspicious;Preoccupied  Thought Process  Coherency Blocking  Content Paranoia  Delusions Paranoid  Perception WDL  Hallucination None reported or observed  Judgment Poor  Confusion Mild  Danger to Self  Current suicidal ideation? Denies  Danger to Others  Danger to Others None reported or observed

## 2020-08-13 NOTE — Progress Notes (Signed)
Pt coming out her room trying to go into other patients rooms, pt redirected multiple times, pt agitated. Pt given PRN Geodon per [facility]

## 2020-08-13 NOTE — Progress Notes (Signed)
Dar Note: Patient presents with paranoid behaviors and anxious mood.  She is observed pacing the hallway while demanding to leave.  Had to be redirected away from the doorway several times.  Argumentative with staff when redirected.  Accusing staff and peers of talking about her.  Medication given as prescribed.  Took medications only after several attempts with encouragement.  Refused to attend group when invited.  stated she doesn't want to be in group with "demons."  Patient is safe on the unit.

## 2020-08-13 NOTE — Progress Notes (Signed)
Recreation Therapy Notes  Date: 4.5.22 Time: 1000 Location: 500 Hall Dayroom   Group Topic: Self-esteem  Goal Area(s) Addresses:  Patient will identify and write positive trait about themself. Patient will successfully identify things and people who are influential to them. Patient will acknowledge the benefit of healthy self-esteem. Patient will endorse understanding of ways to increase self-esteem.   Behavioral Response: Engaged, Appropriate, Attentive   Intervention: Personalized Plate- printed license plate template, markers, colored pencils   Activity: LRT explained to patients why self esteem is important and each person has some unique things about them that makes them unique.  Patients were then given a blank license plate template.  Patients were to highlight the things that they are proud of, what they have accomplished or things they want to accomplish.    Education: LRT educated patients on the importance of healthy self-esteem and ways to build self-esteem. LRT addressed discharge planning reviewing positive coping skills and healthy support systems.  Education Outcome: Acknowledges education/In group clarification offered   Clinical Observations/Feedback: Pt was seemed flat at times but there were instances where pt would give a slight smile.  Pt put on her plate "I am Boov and I can groove".  Pt stated she learned to dance by being herself.   Caroll Rancher, LRT/CTRS   Lillia Abed, Jannetta Massey A 08/13/2020 11:12 AM

## 2020-08-13 NOTE — Progress Notes (Signed)
Patient required frequent redirection , she remains paranoid and she required constant encouragement to take medications.

## 2020-08-13 NOTE — Progress Notes (Signed)
Pt up in the hallway pacing, hard to redirect. Pt paranoid and agitated.

## 2020-08-13 NOTE — [facility] Counselor (Signed)
CSW spoke to this patients mother and provided her with an update on patients care and progress.   Pt's mother plans to bring clothes for this patient later this date.    [name] MSW, LCSW Clincal Social Worker  [facility]

## 2020-08-14 DIAGNOSIS — F19950 Other psychoactive substance use, unspecified with psychoactive substance-induced psychotic disorder with delusions: Secondary | ICD-10-CM

## 2020-08-14 MED ORDER — HALOPERIDOL 5 MG PO TABS
10.0000 mg | ORAL_TABLET | Freq: Every day | ORAL | Status: DC
  Administered 2020-08-15: 10 mg via ORAL
  Filled 2020-08-14: qty 2

## 2020-08-14 MED ORDER — LORAZEPAM 2 MG/ML IJ SOLN
INTRAMUSCULAR | Status: AC
  Filled 2020-08-14: qty 1

## 2020-08-14 MED ORDER — LORAZEPAM 1 MG PO TABS: 1.0000 mg | ORAL_TABLET | Freq: Four times a day (QID) | ORAL | Status: DC | PRN

## 2020-08-14 MED ORDER — HALOPERIDOL 5 MG PO TABS
15.0000 mg | ORAL_TABLET | Freq: Every day | ORAL | Status: DC
  Filled 2020-08-14: qty 3

## 2020-08-14 MED ORDER — LORAZEPAM 2 MG/ML IJ SOLN
2.0000 mg | Freq: Once | INTRAMUSCULAR | Status: AC
  Administered 2020-08-14: 2 mg via INTRAMUSCULAR

## 2020-08-14 MED ORDER — HALOPERIDOL LACTATE 5 MG/ML IJ SOLN
15.0000 mg | Freq: Every day | INTRAMUSCULAR | Status: DC
  Filled 2020-08-14: qty 3

## 2020-08-14 MED ORDER — OLANZAPINE 10 MG PO TBDP: 10.0000 mg | ORAL_TABLET | Freq: Three times a day (TID) | ORAL | Status: DC | PRN

## 2020-08-14 MED ORDER — HALOPERIDOL LACTATE 5 MG/ML IJ SOLN
10.0000 mg | Freq: Two times a day (BID) | INTRAMUSCULAR | Status: DC
  Administered 2020-08-14: 10 mg via INTRAMUSCULAR
  Filled 2020-08-14 (×5): qty 2

## 2020-08-14 MED ORDER — HALOPERIDOL LACTATE 5 MG/ML IJ SOLN
10.0000 mg | Freq: Every day | INTRAMUSCULAR | Status: DC
  Filled 2020-08-14: qty 2

## 2020-08-14 MED ORDER — HALOPERIDOL LACTATE 5 MG/ML IJ SOLN
10.0000 mg | Freq: Once | INTRAMUSCULAR | Status: AC
  Administered 2020-08-14: 10 mg via INTRAMUSCULAR
  Filled 2020-08-14: qty 2

## 2020-08-14 MED ORDER — HALOPERIDOL 5 MG PO TABS
10.0000 mg | ORAL_TABLET | Freq: Two times a day (BID) | ORAL | Status: DC
  Filled 2020-08-14 (×6): qty 2

## 2020-08-14 MED ORDER — ZIPRASIDONE MESYLATE 20 MG IM SOLR
20.0000 mg | Freq: Four times a day (QID) | INTRAMUSCULAR | Status: DC | PRN
  Filled 2020-08-14: qty 20

## 2020-08-14 NOTE — Progress Notes (Signed)
Patient paranoid all morning and afternoon. This morning she refused to take her morning medications after Clinical research associate (nurse) spent 10 minutes encouraging her to take the scheduled medications. She appears paranoid, suspicious, and responding to internal stimuli as evidence by delayed responses, wide eyed, constantly worrying, and repeatedly saying "what did I do wrong?" Verbal support provided to the pt. Patient continues to be reoriented to the unit and redirected by staff as needed. She has been wandering into other pts rooms on the unit and is difficult to redirect. She was given Haldol and Ativan for psychosis/agitation.

## 2020-08-14 NOTE — Progress Notes (Signed)
Recreation Therapy Notes  Date: 4.6.22 Time:  1000 Location: 500 Hall Dayroom  Group Topic: Goal Setting  Goal Area(s) Addresses:  Patient will identify goals they want to accomplish in the foreseeable future. Patient will identify the benefits of goals. Patient will identify how goals can be effective post d/c.  Intervention: Goal Planning Sheet  Activity: Patients were asked to identify goals they want to accomplish in a week, month, year and five years.  Patients were to then identify any obstacles they may face, what they needed to be successful and what they can start doing to work towards goals.  Education:  Discharge Planning, Coping Skills, Goal Setting  Education Outcome: Acknowledges Education/In Group Clarification Provided/Needs Additional Education  Clinical Observations: Pt did not attend group session.    Caroll Rancher, LRT/CTRS     Caroll Rancher A 08/14/2020 11:47 AM

## 2020-08-14 NOTE — [facility] Group Notes (Signed)
[facility] LCSW Group Therapy  Type of Therapy/Topic: Group Therapy: Six Dimensions of Wellness  Participation Level:Active  Description of Group:  This group will address the concept of wellness and the six concepts of wellness: occupational, physical, social, intellectual, spiritual, and emotional. Patients will be encouraged to process areas in their lives that are out of balance and identify reasons for remaining unbalanced. Patients will be encouraged to explore ways to practice healthy habits daily to attain better physical and mental health outcomes.  Therapeutic Goals:  1. Identify aspects of wellness that they are doing well.  2. Identify aspects of wellness that they would like to improve upon.  3. Identify one action they can take to improve an aspect of wellness in their lives.  Summary of Patient Progress:Due to unforseen circumstances CSW provided the patient with a packet of worksheets during group time. The Pt accepted these worksheets and was offered an opportunity to discuss the worksheets with the CSW.   Susan English [name] 08/14/2020, 2:38 PM 

## 2020-08-14 NOTE — Progress Notes (Signed)
Pt  has been sleep much of the evening due to the injections she received during the day. Will continue to monitor.      08/14/20 2100  Psych Admission Type (Psych Patients Only)  Admission Status Involuntary  Psychosocial Assessment  Patient Complaints Suspiciousness  Eye Contact Intense;Watchful  Facial Expression Anxious;Worried  Affect Anxious;Preoccupied  Speech Soft;Slow;Elective mutism  Interaction Cautious;Forwards little;Guarded;Minimal  Motor Activity Slow  Appearance/Hygiene Publishing rights manager;Concrete thinking;Disorganized  Content Preoccupation;Paranoia  Delusions Paranoid  Perception Derealization  Hallucination None reported or observed  Judgment Poor  Confusion Mild  Danger to Self  Current suicidal ideation? Denies  Danger to Others  Danger to Others None reported or observed

## 2020-08-14 NOTE — Progress Notes (Signed)
[facility] MD Progress Note  08/14/2020 10:24 AM MELANE WINDHOLZ  MRN:  [id number] Subjective:  Patient is a 30 year old female who originally presented to the [facility] emergency department on 08/04/2020 with disorganization, rambling speech and tangential thoughts.  Objective: Patient is seen and examined.  Patient is a 30 year old female with the above-stated past psychiatric history is seen in follow-up.  She is essentially unchanged from yesterday.  During the day yesterday she continued to spit out her medication.  Whether or not she has been compliant with the Zyprexa while she was at the [facility] is speculative.  She remains significantly psychotic.  She is clearly paranoid, having thought blocking.  She is restless.  She is still not sleeping well.  I have stopped the Zyprexa today and switched her to Haldol 10 mg IM or p.o. twice daily.  I have asked Dr. Fayrene Fearing to do a second opinion for forced medications if she refuses.  Nursing notes reflect she slept about 5 hours last night, but really only about 7 hours in last 2 days.  I spoke with her family today, and they were concerned that taking Percocet may have done this given the Suboxone, and I told him that I thought that was unlikely.  They did state that this was an acute change, and that there was no family history of any thought disorders.  She is refusing vital signs, and the last ones in the chart are from 08/11/2020.  Her blood pressure was stable at that time.  No new laboratories.  Principal Problem: Brief psychotic disorder (HCC) Diagnosis: Principal Problem:   Brief psychotic disorder (HCC) Active Problems:   History of opioid abuse (HCC)  Total Time spent with patient: 20 minutes  Past Psychiatric History: See admission H&P  Past Medical History:  Past Medical History:  Diagnosis Date  . Headache(784.0)   . 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 Hypertension   . Obesity     Past  Surgical History:  Procedure Laterality Date  . CESAREAN SECTION    . ORIF ANKLE FRACTURE Right 12/22/2012   Procedure: OPEN REDUCTION INTERNAL FIXATION (ORIF) ANKLE FRACTURE;  Surgeon: Mable Paris, MD;  Location: [facility];  Service: Orthopedics;  Laterality: Right;  . sweat gland     Family History:  Family History  Problem Relation Age of Onset  . Hypertension Mother   . Diabetes Mother   . Diabetes Maternal Grandmother   . Breast cancer Paternal Grandmother    Family Psychiatric  History: See admission H&P Social History:  Social History   Substance and Sexual Activity  Alcohol Use No   Comment: occ     Social History   Substance and Sexual Activity  Drug Use Yes  . Types: Marijuana, Other-see comments   Comment: pt reports CBD use also    Social History   Socioeconomic History  . Marital status: Single    Spouse name: n/a  . Number of children: 2  . Years of education: Not on file  . Highest education level: Associate degree: occupational, Scientist, product/process development, or vocational program  Occupational History  . Not on file  Tobacco Use  . Smoking status: Current Every Day Smoker    Packs/day: 0.50    Types: Cigarettes  . Smokeless tobacco: Never Used  Vaping Use  . Vaping Use: Never used  Substance and Sexual Activity  . Alcohol use: No    Comment: occ  .  Drug use: Yes    Types: Marijuana, Other-see comments    Comment: pt reports CBD use also  . Sexual activity: Yes    Birth control/protection: Implant  Other Topics Concern  . Not on file  Social History Narrative  . Not on file   Social Determinants of Health   Financial Resource Strain: Not on file  Food Insecurity: Not on file  Transportation Needs: Not on file  Physical Activity: Not on file  Stress: Not on file  Social Connections: Not on file   Additional Social History:                         Sleep: Poor  Appetite:  Fair  Current Medications: Current  Facility-Administered Medications  Medication Dose Route Frequency Provider Last Rate Last Admin  . acetaminophen (TYLENOL) tablet 650 mg  650 mg Oral Q6H PRN Jaclyn Shaggy, PA-C      . alum & mag hydroxide-simeth (MAALOX/MYLANTA) 200-200-20 MG/5ML suspension 30 mL  30 mL Oral Q4H PRN Melbourne Abts W, PA-C      . buprenorphine-naloxone (SUBOXONE) 2-0.5 mg per SL tablet 2 tablet  2 tablet Sublingual TID WC & HS Laveda Abbe, NP   2 tablet at 08/13/20 2015  . haloperidol (HALDOL) tablet 10 mg  10 mg Oral BID Antonieta Pert, MD       Or  . haloperidol lactate (HALDOL) injection 10 mg  10 mg Intramuscular BID Antonieta Pert, MD      . hydrochlorothiazide (HYDRODIURIL) tablet 25 mg  25 mg Oral q morning Antonieta Pert, MD      . hydrOXYzine (ATARAX/VISTARIL) tablet 25 mg  25 mg Oral TID PRN Jaclyn Shaggy, PA-C   25 mg at 08/13/20 2012  . magnesium hydroxide (MILK OF MAGNESIA) suspension 30 mL  30 mL Oral Daily PRN Melbourne Abts W, PA-C      . nicotine (NICODERM CQ - dosed in mg/24 hours) patch 21 mg  21 mg Transdermal Daily Antonieta Pert, MD   21 mg at 08/13/20 1059  . OLANZapine zydis (ZYPREXA) disintegrating tablet 10 mg  10 mg Oral BID Antonieta Pert, MD   10 mg at 08/13/20 2013  . OLANZapine zydis (ZYPREXA) disintegrating tablet 5 mg  5 mg Oral Q8H PRN Laveda Abbe, NP   5 mg at 08/13/20 1324  . potassium chloride SA (KLOR-CON) CR tablet 20 mEq  20 mEq Oral BID Antonieta Pert, MD   20 mEq at 08/13/20 2015  . traZODone (DESYREL) tablet 50 mg  50 mg Oral QHS PRN Jaclyn Shaggy, PA-C   50 mg at 08/13/20 2012    Lab Results: No results found for this or any previous visit (from the past 48 hour(s)).  Blood Alcohol level:  Lab Results  Component Value Date   ETH <10 08/08/2020    Metabolic Disorder Labs: No results found for: HGBA1C, MPG No results found for: PROLACTIN No results found for: CHOL, TRIG, HDL, CHOLHDL, VLDL, LDLCALC  Physical  Findings: AIMS: Facial and Oral Movements Muscles of Facial Expression: None, normal Lips and Perioral Area: None, normal Jaw: None, normal Tongue: None, normal,Extremity Movements Upper (arms, wrists, hands, fingers): None, normal Lower (legs, knees, ankles, toes): None, normal, Trunk Movements Neck, shoulders, hips: None, normal, Overall Severity Severity of abnormal movements (highest score from questions above): None, normal Incapacitation due to abnormal movements: None, normal Patient's awareness of abnormal movements (rate  only patient's report): No Awareness, Dental Status Current problems with teeth and/or dentures?: No Does patient usually wear dentures?: No  CIWA:    COWS:     Musculoskeletal: Strength & Muscle Tone: within normal limits Gait & Station: normal Patient leans: N/A  Psychiatric Specialty Exam:  Presentation  General Appearance: Fairly Groomed  Eye Contact:Minimal  Speech:Blocked  Speech Volume:Decreased  Handedness:Right   Mood and Affect  Mood:Dysphoric; Irritable  Affect:Constricted   Thought Process  Thought Processes:Disorganized  Descriptions of Associations:Loose  Orientation:Partial  Thought Content:Delusions; Paranoid Ideation  History of Schizophrenia/Schizoaffective disorder:No  Duration of Psychotic Symptoms:Less than six months  Hallucinations:Hallucinations: Auditory  Ideas of Reference:Delusions; Paranoia  Suicidal Thoughts:Suicidal Thoughts: No  Homicidal Thoughts:Homicidal Thoughts: No   Sensorium  Memory:Immediate Poor; Recent Poor; Remote Poor  Judgment:Impaired  Insight:Lacking   Executive Functions  Concentration:Poor  Attention Span:Poor  Recall:Poor  Fund of Knowledge:Poor  Language:Poor   Psychomotor Activity  Psychomotor Activity:Psychomotor Activity: Increased   Assets  Assets:Desire for Improvement; Resilience; Social Support   Sleep  Sleep:Sleep: Poor Number of Hours of  Sleep: 3.5    Physical Exam: Physical Exam Vitals and nursing note reviewed.  HENT:     Head: Normocephalic and atraumatic.  Pulmonary:     Effort: Pulmonary effort is normal.  Neurological:     General: No focal deficit present.     Mental Status: She is alert and oriented to person, place, and time.    ROS Blood pressure 103/80, pulse (!) 115, temperature 99 F (37.2 C), temperature source Oral, resp. rate 18, height 5\' 8"  (1.727 m), weight 124.7 kg, last menstrual period 07/18/2020, SpO2 100 %, unknown if currently breastfeeding. Body mass index is 41.81 kg/m.   Treatment Plan Summary: Daily contact with patient to assess and evaluate symptoms and progress in treatment, Medication management and Plan : Patient is seen and examined.  Patient is a 30 year old female with the above-stated past psychiatric history who is seen in follow-up.   Diagnosis: 1.  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  History of opiate dependence 3.  Cannabis use disorder  Plan: 1.  I have requested Dr. 38 to give a second opinion for forced medications on this patient. 2.  Stop Zyprexa 3.  Start haloperidol 10 mg p.o. or IM twice daily for psychosis. 4.  Continue Suboxone 4 mg p.o. 3 times daily for opiate dependence. 5.  We still need a CBC with differential and other laboratories. 6.  We still need an EKG, and I reorder that today. 7.  Continue hydrochlorothiazide 25 mg p.o. daily.  I reduce this from 50 mg yesterday given the hyponatremia and hypokalemia.  This is for blood pressure. 8.  Continue Geodon 20 mg IM every 6 hours as needed significant agitation. 9.  Continue trazodone 50 mg p.o. nightly as needed insomnia. 10.  Disposition planning-in progress.  Fayrene Fearing, MD 08/14/2020, 10:24 AM

## 2020-08-14 NOTE — Progress Notes (Signed)
[facility] Second Physician Opinion Progress Note for Medication Administration to Non-consenting Patients (For Involuntarily Committed Patients)  Patient: Susan English Date of Birth: [birth year] MRN: [id number]   I was asked by the patient's treating psychiatrist to provide a second opinion regarding possible use of forced medications.  Medical English reviewed.  Patient's case discussed with nursing staff and with her primary psychiatrist.   Susan English is a 30 year old female admitted with psychotic symptoms.  Staff report the patient has been disorganized on the unit and has been observed to spit out oral medications after they have been administered.  She slept only 3.5 hours last night.  She is often standing in the hall or lingering at the nurses station.  She has tried to enter the rooms of her peers on multiple occasions and requires frequent redirection.  I saw patient on the unit this morning and attempted to meet with her in her room to have a conversation regarding medications.  The patient was poorly engaged in our conversation and often remained mute despite being asked simple questions.  I had to repeatedly ask patient if we could meet in her room before she was willing to walk down the hall toward her room.  She walked down the hall after much encouragement but declined to enter her room completely and insisted on standing in the doorway.  She appears anxious with intermittently intense eye contact.  She displays prolonged speech latency and thought blocking.  She appears anxious, paranoid, disorganized and preoccupied with internal stimuli.  The patient does not offer any information spontaneously.  When asked, the patient acknowledges feeling frightened that others are trying to harm her but declines to elaborate.  Despite repeated efforts, the patient is unable to engage in even a simple conversation with me regarding the reason why she is in the hospital, her symptoms or the need for medications,  etc.  When asked if she has any problem that medications might help, she responds "withdrawal" and declines to elaborate further.  As a result of her symptoms, the patient is unable to participate meaningfully in decisions regarding her treatment including decisions regarding medications.  Reason for the Medication: The patient, without the benefit of the specific treatment measure, is incapable of participating in any available treatment plan that will give the patient a realistic opportunity of improving the patient's condition.   Consideration of Side Effects: Consideration of the side effects related to the medication plan has been given.  Rationale for Medication Administration: Patient has ongoing severe psychotic symptoms that are not likely to improve without treatment with forced medications and are likely to result in increased disability and prolonged hospital stay.    Claudie Revering, MD 08/14/20  11:31 AM   This documentation is good for (7) seven days from the date of the MD signature. New documentation must be completed every seven (7) days with detailed justification in the medical English if the patient requires continued non-emergent administration of psychotropic medications.

## 2020-08-14 NOTE — Progress Notes (Signed)
   08/14/20 0500  Sleep  Number of Hours 3.5   Pt slept 5.25 hrs during day shift per 15 min check sheet

## 2020-08-15 LAB — COMPREHENSIVE METABOLIC PANEL
ALT: 16 U/L (ref 0–44)
AST: 23 U/L (ref 15–41)
Albumin: 4.1 g/dL (ref 3.5–5.0)
Alkaline Phosphatase: 82 U/L (ref 38–126)
Anion gap: 12 (ref 5–15)
BUN: 23 mg/dL — ABNORMAL HIGH (ref 6–20)
CO2: 24 mmol/L (ref 22–32)
Calcium: 9.2 mg/dL (ref 8.9–10.3)
Chloride: 98 mmol/L (ref 98–111)
Creatinine, Ser: 0.78 mg/dL (ref 0.44–1.00)
GFR, Estimated: >60 mL/min (ref >60)
Glucose, Bld: 122 mg/dL — ABNORMAL HIGH (ref 70–99)
Potassium: 3.9 mmol/L (ref 3.5–5.1)
Sodium: 134 mmol/L — ABNORMAL LOW (ref 135–145)
Total Bilirubin: 1.5 mg/dL — ABNORMAL HIGH (ref 0.3–1.2)
Total Protein: 8.5 g/dL — ABNORMAL HIGH (ref 6.5–8.1)

## 2020-08-15 LAB — CBC WITH DIFFERENTIAL/PLATELET
Abs Immature Granulocytes: 0.04 10*3/uL (ref 0.00–0.07)
Basophils Absolute: 0.1 10*3/uL (ref 0.0–0.1)
Basophils Relative: 1 %
Eosinophils Absolute: 0 10*3/uL (ref 0.0–0.5)
Eosinophils Relative: 0 %
HCT: 41.7 % (ref 36.0–46.0)
Hemoglobin: 13.3 g/dL (ref 12.0–15.0)
Immature Granulocytes: 0 %
Lymphocytes Relative: 19 %
Lymphs Abs: 2 10*3/uL (ref 0.7–4.0)
MCH: 28.4 pg (ref 26.0–34.0)
MCHC: 31.9 g/dL (ref 30.0–36.0)
MCV: 89.1 fL (ref 80.0–100.0)
Monocytes Absolute: 1.7 10*3/uL — ABNORMAL HIGH (ref 0.1–1.0)
Monocytes Relative: 16 %
Neutro Abs: 7 10*3/uL (ref 1.7–7.7)
Neutrophils Relative %: 64 %
Platelets: 354 10*3/uL (ref 150–400)
RBC: 4.68 MIL/uL (ref 3.87–5.11)
RDW: 13.6 % (ref 11.5–15.5)
WBC: 10.8 10*3/uL — ABNORMAL HIGH (ref 4.0–10.5)
nRBC: 0 % (ref 0.0–0.2)

## 2020-08-15 LAB — TSH: TSH: 2.09 u[IU]/mL (ref 0.350–4.500)

## 2020-08-15 MED ORDER — HALOPERIDOL 5 MG PO TABS
15.0000 mg | ORAL_TABLET | Freq: Three times a day (TID) | ORAL | Status: DC
  Administered 2020-08-15 – 2020-08-17 (×5): 15 mg via ORAL
  Filled 2020-08-15 (×8): qty 3

## 2020-08-15 MED ORDER — HALOPERIDOL 5 MG PO TABS
10.0000 mg | ORAL_TABLET | Freq: Three times a day (TID) | ORAL | Status: DC
  Administered 2020-08-15: 10 mg via ORAL
  Filled 2020-08-15 (×3): qty 2

## 2020-08-15 MED ORDER — HALOPERIDOL LACTATE 5 MG/ML IJ SOLN
10.0000 mg | Freq: Three times a day (TID) | INTRAMUSCULAR | Status: DC
  Filled 2020-08-15 (×3): qty 2

## 2020-08-15 MED ORDER — HALOPERIDOL LACTATE 5 MG/ML IJ SOLN
15.0000 mg | Freq: Three times a day (TID) | INTRAMUSCULAR | Status: DC
  Filled 2020-08-15 (×8): qty 3

## 2020-08-15 MED ORDER — ZIPRASIDONE MESYLATE 20 MG IM SOLR
20.0000 mg | INTRAMUSCULAR | Status: AC
  Administered 2020-08-15: 20 mg via INTRAMUSCULAR
  Filled 2020-08-15: qty 20

## 2020-08-15 NOTE — [facility] Group Notes (Signed)
Occupational Therapy Group Note Date: 08/15/2020 Group Topic/Focus: Sensory Modulation  Group Description: Group encouraged increased participation and engagement through discussion focused on sensory modulation and self-soothing through use of the 8 senses. Discussion introduced the concept of sensory modulation and integration, focusing on how we can utilize our body and it's senses to self-soothe or cope, when we are experiencing an over or under-whelming sensation or feeling. Group members were introduced to a sensory diet checklist as a helpful tool/resource that can be utilized to identify what activities and strategies we prefer and do not prefer based upon our response to different stimulus. The concept of alerting vs calming activities was also introduced to understand how to counteract how we are feeling (Example: when we are feeling overwhelmed/stressed, engage in something calming. When we are feeling depressed/low energy, engage in something alerting). Group members engaged actively in discussion sharing their own personal sensory likes/dislikes.   Participation Level: Minimal   Participation Quality: Maximum Cues   Behavior: Sedated   Speech/Thought Process: Barely audible   Affect/Mood: Constricted   Insight: Poor   Judgement: Poor   Individualization: Susan English was minimally engaged in their participation of group discussion/activity. Pt was present and remained in group space for duration, however declined to engage stating "I'm just listening." Pt appeared tired and sedated, seemingly falling asleep while seated.   Modes of Intervention: Activity, Discussion and Education  Patient Response to Interventions:  Disengaged   Plan: Continue to engage patient in OT groups 2 - 3x/week.  08/15/2020  Donne Hazel, MOT, OTR/L

## 2020-08-15 NOTE — Progress Notes (Signed)
Recreation Therapy Notes  Date: 4.7.22 Time: 1000 Location: 500 Hall Dayroom  Group Topic: Anger Management  Goal Area(s) Addresses:  Patient will identify triggers for anger.  Patient will identify a situation that makes them angry.  Patient will identify what other emotions come with anger.   Behavioral Response: Attentive  Intervention: Conversation with group, and worksheets  Activity: Patient discussed anger and what makes them angry.  Patients were given an anger thermometer.  Patients were to rank 10 things on the thermometer that make them angry.  Patients were to rank them 1-10.  One being the least angry and ten being the most angry.    Education: Anger Management, Discharge Planning   Education Outcome: Acknowledges education/In group clarification offered/Needs additional education.   Clinical Observations/Feedback: Pt didn't really understand the instructions of group.  Pt rated losing things as a 1, making someone sad 2, being outside 7, doing family stuff 8, helping people 9, trees and being happy a 10.  Pt when asked were these the things that get her angry, pt stated no.  Pt expressed theres not much that gets her angry.      Caroll Rancher, LRT/CTRS    Lillia Abed, Daja Shuping A 08/15/2020 11:10 AM

## 2020-08-15 NOTE — Progress Notes (Signed)
Dar Note: Patient still paranoid and pacing the hallway while responding to internal stimuli.  Medication given with encouragement.  Preoccupied with going home to her children.  Continues to need redirection on the unit.  Constantly going into other patient's room.  Accusing staff and provider of punishing her for no reason.  Routine safety checks maintained.  Geodon 20 mg IM given for agitation with good effect.

## 2020-08-15 NOTE — Progress Notes (Signed)
Pt not given her HS dose of Haldol due to pt receiving 20 total mg IM earlier in the day and pt being sleep much of the evening. Pt ended up sleeping 10 hrs, from approximately  1500 to 0100 .

## 2020-08-15 NOTE — Progress Notes (Signed)
Pt had better interaction with staff this evening. Pt could hold conversation with staff and give appropriate responses in adequate time. Pt experienced some delayed responses, but pt was redirectable and pt did not stand in the hallway with blank look on her face . Pt seen in dayroom watching TV much of the evening and pt came to the medication window and took her medication without any excessive prompting.    08/15/20 2300  Psych Admission Type (Psych Patients Only)  Admission Status Involuntary  Psychosocial Assessment  Patient Complaints Worrying;Suspiciousness  Eye Contact Intense;Watchful  Facial Expression Anxious;Worried  Affect Anxious;Preoccupied  Speech Soft;Slow;Elective mutism  Interaction Cautious;Forwards little;Guarded;Minimal  Motor Activity Slow  Appearance/Hygiene Disheveled  Behavior Characteristics Cooperative;Anxious  Mood Suspicious;Labile  Thought Process  Coherency Blocking;Concrete thinking;Disorganized  Content Preoccupation;Paranoia  Delusions Paranoid  Perception Derealization  Hallucination None reported or observed  Judgment Poor  Confusion Mild  Danger to Self  Current suicidal ideation? Denies  Danger to Others  Danger to Others None reported or observed

## 2020-08-15 NOTE — Progress Notes (Signed)
[facility] MD Progress Note  08/15/2020 11:40 AM Susan English  MRN:  [id number] Subjective:  Patient is a 30 year old female who [facility] on 08/04/2020 with disorganization, rambling speech and tangential thoughts.  Objective: Patient is seen and examined.  Patient is a 30 year old female with the above-stated past psychiatric history who is seen in follow-up.  Although she did have improved sleep last night she is essentially unchanged.  She standing out the hallway staring at me.  She follows me.  She wants to know when she is going to be discharged charge she continues to have thought blocking, and clearly responding to internal stimuli.  She looks off while we are conversing from some internal stimuli.  She continues to require forced medication.  She stated she is not on the right medicine.  I said "what is the right medicine, and she said "Suboxone".  I reminded her that she is on the Suboxone.  She is a little bit more conversant today and stated she needs to get home to her 2 children.  As stated above she slept 7.25 hours last night.  She continues to be restless, paranoid and intermittently agitated.  She is tachycardic this morning with a rate of 127.  Blood pressure is 133/77, pulse oximetry was at 100% on room air.  Laboratories from this a.m. showed a sodium of 134 and a potassium of 3.9.  Blood sugar was 122.  Creatinine was normal at 0.78.  Liver function enzymes are normal.  CBC was completely normal.  Differential was normal.  Principal Problem: Substance-induced psychotic disorder with delusions (HCC) Diagnosis: Principal Problem:   Substance-induced psychotic disorder with delusions (HCC)  Total Time spent with patient: 20 minutes  Past Psychiatric History: See admission H&P  Past Medical History:  Past Medical History:  Diagnosis Date  . Headache(784.0)   . HSIL (high grade squamous intraepithelial lesion) on Pap  smear of cervix   . Hypertension   . Obesity     Past Surgical History:  Procedure Laterality Date  . CESAREAN SECTION    . ORIF ANKLE FRACTURE Right 12/22/2012   Procedure: OPEN REDUCTION INTERNAL FIXATION (ORIF) ANKLE FRACTURE;  Surgeon: Mable ParisJustin William Chandler, MD;  Location: [facility];  Service: Orthopedics;  Laterality: Right;  . sweat gland     Family History:  Family History  Problem Relation Age of Onset  . Hypertension Mother   . Diabetes Mother   . Diabetes Maternal Grandmother   . Breast cancer Paternal Grandmother    Family Psychiatric  History: See admission H&P Social History:  Social History   Substance and Sexual Activity  Alcohol Use No   Comment: occ     Social History   Substance and Sexual Activity  Drug Use Yes  . Types: Marijuana, Other-see comments   Comment: pt reports CBD use also    Social History   Socioeconomic History  . Marital status: Single    Spouse name: n/a  . Number of children: 2  . Years of education: Not on file  . Highest education level: Associate degree: occupational, Scientist, product/process developmenttechnical, or vocational program  Occupational History  . Not on file  Tobacco Use  . Smoking status: Current Every Day Smoker    Packs/day: 0.50    Types: Cigarettes  . Smokeless tobacco: Never Used  Vaping Use  . Vaping Use: Never used  Substance and Sexual Activity  . Alcohol use: No  Comment: occ  . Drug use: Yes    Types: Marijuana, Other-see comments    Comment: pt reports CBD use also  . Sexual activity: Yes    Birth control/protection: Implant  Other Topics Concern  . Not on file  Social History Narrative  . Not on file   Social Determinants of Health   Financial Resource Strain: Not on file  Food Insecurity: Not on file  Transportation Needs: Not on file  Physical Activity: Not on file  Stress: Not on file  Social Connections: Not on file   Additional Social History:                         Sleep:  Good  Appetite:  Fair  Current Medications: Current Facility-Administered Medications  Medication Dose Route Frequency Provider Last Rate Last Admin  . acetaminophen (TYLENOL) tablet 650 mg  650 mg Oral Q6H PRN Jaclyn Shaggy, PA-C      . alum & mag hydroxide-simeth (MAALOX/MYLANTA) 200-200-20 MG/5ML suspension 30 mL  30 mL Oral Q4H PRN Melbourne Abts W, PA-C      . buprenorphine-naloxone (SUBOXONE) 2-0.5 mg per SL tablet 2 tablet  2 tablet Sublingual TID WC & HS Laveda Abbe, NP   2 tablet at 08/15/20 5643  . haloperidol (HALDOL) tablet 10 mg  10 mg Oral TID Antonieta Pert, MD       Or  . haloperidol lactate (HALDOL) injection 10 mg  10 mg Intramuscular TID Antonieta Pert, MD      . hydrochlorothiazide (HYDRODIURIL) tablet 25 mg  25 mg Oral q morning Antonieta Pert, MD      . hydrOXYzine (ATARAX/VISTARIL) tablet 25 mg  25 mg Oral TID PRN Jaclyn Shaggy, PA-C   25 mg at 08/15/20 0706  . OLANZapine zydis (ZYPREXA) disintegrating tablet 10 mg  10 mg Oral Q8H PRN Antonieta Pert, MD       And  . LORazepam (ATIVAN) tablet 1 mg  1 mg Oral Q6H PRN Antonieta Pert, MD       And  . ziprasidone (GEODON) injection 20 mg  20 mg Intramuscular Q6H PRN Antonieta Pert, MD      . magnesium hydroxide (MILK OF MAGNESIA) suspension 30 mL  30 mL Oral Daily PRN Melbourne Abts W, PA-C      . nicotine (NICODERM CQ - dosed in mg/24 hours) patch 21 mg  21 mg Transdermal Daily Antonieta Pert, MD   21 mg at 08/13/20 1059  . traZODone (DESYREL) tablet 50 mg  50 mg Oral QHS PRN Jaclyn Shaggy, PA-C   50 mg at 08/13/20 2012    Lab Results:  Results for orders placed or performed during the hospital encounter of 08/11/20 (from the past 48 hour(s))  CBC with Differential/Platelet     Status: Abnormal   Collection Time: 08/15/20  6:30 AM  Result Value Ref Range   WBC 10.8 (H) 4.0 - 10.5 K/uL   RBC 4.68 3.87 - 5.11 MIL/uL   Hemoglobin 13.3 12.0 - 15.0 g/dL   HCT 32.9 51.8 - 84.1 %    MCV 89.1 80.0 - 100.0 fL   MCH 28.4 26.0 - 34.0 pg   MCHC 31.9 30.0 - 36.0 g/dL   RDW 66.0 63.0 - 16.0 %   Platelets 354 150 - 400 K/uL   nRBC 0.0 0.0 - 0.2 %   Neutrophils Relative % 64 %   Neutro Abs  7.0 1.7 - 7.7 K/uL   Lymphocytes Relative 19 %   Lymphs Abs 2.0 0.7 - 4.0 K/uL   Monocytes Relative 16 %   Monocytes Absolute 1.7 (H) 0.1 - 1.0 K/uL   Eosinophils Relative 0 %   Eosinophils Absolute 0.0 0.0 - 0.5 K/uL   Basophils Relative 1 %   Basophils Absolute 0.1 0.0 - 0.1 K/uL   Immature Granulocytes 0 %   Abs Immature Granulocytes 0.04 0.00 - 0.07 K/uL    Comment: Performed at [facility], [address], [place], [place] [address]  Comprehensive metabolic panel     Status: Abnormal   Collection Time: 08/15/20  6:30 AM  Result Value Ref Range   Sodium 134 (L) 135 - 145 mmol/L   Potassium 3.9 3.5 - 5.1 mmol/L   Chloride 98 98 - 111 mmol/L   CO2 24 22 - 32 mmol/L   Glucose, Bld 122 (H) 70 - 99 mg/dL    Comment: Glucose reference range applies only to samples taken after fasting for at least 8 hours.   BUN 23 (H) 6 - 20 mg/dL   Creatinine, Ser 3.26 0.44 - 1.00 mg/dL   Calcium 9.2 8.9 - 71.2 mg/dL   Total Protein 8.5 (H) 6.5 - 8.1 g/dL   Albumin 4.1 3.5 - 5.0 g/dL   AST 23 15 - 41 U/L   ALT 16 0 - 44 U/L   Alkaline Phosphatase 82 38 - 126 U/L   Total Bilirubin 1.5 (H) 0.3 - 1.2 mg/dL   GFR, Estimated >45 >80 mL/min    Comment: (NOTE) Calculated using the CKD-EPI Creatinine Equation (2021)    Anion gap 12 5 - 15    Comment: Performed at [facility], [address] [place], [place] [address]    Blood Alcohol level:  Lab Results  Component Value Date   ETH <10 08/08/2020    Metabolic Disorder Labs: No results found for: HGBA1C, MPG No results found for: PROLACTIN No results found for: CHOL, TRIG, HDL, CHOLHDL, VLDL, LDLCALC  Physical Findings: AIMS: Facial and Oral Movements Muscles of Facial Expression: None,  normal Lips and Perioral Area: None, normal Jaw: None, normal Tongue: None, normal,Extremity Movements Upper (arms, wrists, hands, fingers): None, normal Lower (legs, knees, ankles, toes): None, normal, Trunk Movements Neck, shoulders, hips: None, normal, Overall Severity Severity of abnormal movements (highest score from questions above): None, normal Incapacitation due to abnormal movements: None, normal Patient's awareness of abnormal movements (rate only patient's report): No Awareness, Dental Status Current problems with teeth and/or dentures?: No Does patient usually wear dentures?: No  CIWA:    COWS:     Musculoskeletal: Strength & Muscle Tone: within normal limits Gait & Station: normal Patient leans: N/A  Psychiatric Specialty Exam:  Presentation  General Appearance: Disheveled  Eye Contact:Fair  Speech:Blocked  Speech Volume:Decreased  Handedness:Right   Mood and Affect  Mood:Anxious; Dysphoric  Affect:Flat   Thought Process  Thought Processes:Disorganized  Descriptions of Associations:Loose  Orientation:Full (Time, Place and Person)  Thought Content:Delusions; Paranoid Ideation  History of Schizophrenia/Schizoaffective disorder:No  Duration of Psychotic Symptoms:Less than six months  Hallucinations:Hallucinations: Auditory  Ideas of Reference:Delusions; Paranoia  Suicidal Thoughts:Suicidal Thoughts: No  Homicidal Thoughts:Homicidal Thoughts: No   Sensorium  Memory:Immediate Poor; Recent Poor; Remote Poor  Judgment:Impaired  Insight:Lacking   Executive Functions  Concentration:Fair  Attention Span:Fair  Recall:Fair  Fund of Knowledge:Poor  Language:Fair   Psychomotor Activity  Psychomotor Activity:Psychomotor Activity: Increased   Assets  Assets:Desire for Improvement; Resilience; Social Support; Housing   Sleep  Sleep:Sleep: Good Number of Hours of Sleep: 7.75    Physical Exam: Physical Exam Vitals and  nursing note reviewed.  HENT:     Head: Normocephalic and atraumatic.  Pulmonary:     Effort: Pulmonary effort is normal.  Neurological:     General: No focal deficit present.     Mental Status: She is alert and oriented to person, place, and time.    ROS Blood pressure 133/77, pulse (!) 127, temperature 98.3 F (36.8 C), temperature source Oral, resp. rate 18, height 5\' 8"  (1.727 m), weight 124.7 kg, last menstrual period 07/18/2020, SpO2 100 %, unknown if currently breastfeeding. Body mass index is 41.81 kg/m.   Treatment Plan Summary: Daily contact with patient to assess and evaluate symptoms and progress in treatment, Medication management and Plan : Patient is seen and examined.  Patient is a 30 year old female with the above-stated past psychiatric history who is seen in follow-up.   Diagnosis: 1.  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 2.  Cannabis use disorder 3.  Opiate dependence  Pertinent findings on examination today: 1.  Paranoia and internal stimuli continue. 2.  Sleep has improved. 3.  TSH had not been done, and was added on for previously drawn blood today. 4.  Forced medication protocol in place  Plan: 1.  Increase Haldol to 10 mg p.o. or IM 3 times daily for psychosis. 2.  Add TSH today. 3.  Continue Suboxone 4 mg sublingual 3 times daily for opiate dependence 4.  Continue Haldol 10 mg p.o. or IM 3 times daily as needed for agitation 5.  Continue hydrochlorothiazide 25 mg p.o. daily for hypertension. 6.  Continue olanzapine agitation protocol. 7.  Order EKG secondary to tachycardia. 8.  Continue hydroxyzine 25 mg p.o. 3 times daily as needed anxiety. 9.  Add Cogentin 0.5 mg p.o. or IM twice daily for tremor 10.  Disposition planning-in progress.  36, MD 08/15/2020, 11:40 AM

## 2020-08-15 NOTE — Progress Notes (Signed)
   08/15/20 0500  Sleep  Number of Hours 7.75

## 2020-08-15 NOTE — Progress Notes (Signed)
Pt coming out her room much of the morning needing redirection. Pt confused , pt needing encouragement.

## 2020-08-16 MED ORDER — RISPERIDONE 3 MG PO TBDP
3.0000 mg | ORAL_TABLET | Freq: Every day | ORAL | Status: DC
  Filled 2020-08-16: qty 1

## 2020-08-16 MED ORDER — RISPERIDONE 2 MG PO TBDP
2.0000 mg | ORAL_TABLET | Freq: Every day | ORAL | Status: DC
  Administered 2020-08-16: 2 mg via ORAL
  Filled 2020-08-16 (×2): qty 1

## 2020-08-16 NOTE — Progress Notes (Signed)
[facility] MD Progress Note  08/16/2020 12:55 PM Susan English  MRN:  [id number] Subjective:  Patient is a 30 year old female who originally presented to the [facility] emergency department on 08/04/2020 with disorganization, rambling speech and tangential thoughts.  Objective: Patient is seen and examined.  Patient is a 30 year old female with the above-stated past psychiatric history who is seen in follow-up.  She is slightly improved from previously.  She was able to come into the office today.  She was able to put some sentences together.  We discussed her high school and whether she had taken special classes or regular classes.  She stated she had taken regular classes.  She stated she did "average".  She was unable to say exactly what average meant.  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  She stated that she had no particular feelings of enjoying her work.  She continues to have thought blocking, and is clearly still paranoid.  Initially this morning her pulse was 97 and repeat was 131.  Blood pressure was stable.  Pulse oximetry on room air was 98%.  She is afebrile.  Her sleep is still poor at 4.75 hours.  Laboratories from 4/7 showed essentially normal electrolytes.  Her CBC was essentially normal.  Differential was normal.  TSH was normal.  Beta-hCG was less than 5.  We still do not have an EKG on her.  Principal Problem: Substance-induced psychotic disorder with delusions (HCC) Diagnosis: Principal Problem:   Substance-induced psychotic disorder with delusions (HCC)  Total Time spent with patient: 20 minutes  Past Psychiatric History: See admission H&P  Past Medical History:  Past Medical History:  Diagnosis Date  . Headache(784.0)   . 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 Hypertension   . Obesity     Past Surgical History:  Procedure Laterality Date  . CESAREAN SECTION    . ORIF ANKLE  FRACTURE Right 12/22/2012   Procedure: OPEN REDUCTION INTERNAL FIXATION (ORIF) ANKLE FRACTURE;  Surgeon: Mable Paris, MD;  Location: [facility];  Service: Orthopedics;  Laterality: Right;  . sweat gland     Family History:  Family History  Problem Relation Age of Onset  . Hypertension Mother   . Diabetes Mother   . Diabetes Maternal Grandmother   . Breast cancer Paternal Grandmother    Family Psychiatric  History: See admission H&P Social History:  Social History   Substance and Sexual Activity  Alcohol Use No   Comment: occ     Social History   Substance and Sexual Activity  Drug Use Yes  . Types: Marijuana, Other-see comments   Comment: pt reports CBD use also    Social History   Socioeconomic History  . Marital status: Single    Spouse name: n/a  . Number of children: 2  . Years of education: Not on file  . Highest education level: Associate degree: occupational, Scientist, product/process development, or vocational program  Occupational History  . Not on file  Tobacco Use  . Smoking status: Current Every Day Smoker    Packs/day: 0.50    Types: Cigarettes  . Smokeless tobacco: Never Used  Vaping Use  . Vaping Use: Never used  Substance and Sexual Activity  . Alcohol use: No    Comment: occ  . Drug use: Yes    Types: Marijuana, Other-see comments    Comment: pt reports CBD use also  .  Sexual activity: Yes    Birth control/protection: Implant  Other Topics Concern  . Not on file  Social History Narrative  . Not on file   Social Determinants of Health   Financial Resource Strain: Not on file  Food Insecurity: Not on file  Transportation Needs: Not on file  Physical Activity: Not on file  Stress: Not on file  Social Connections: Not on file   Additional Social History:                         Sleep: Poor  Appetite:  Fair  Current Medications: Current Facility-Administered Medications  Medication Dose Route Frequency Provider Last Rate  Last Admin  . acetaminophen (TYLENOL) tablet 650 mg  650 mg Oral Q6H PRN Jaclyn Shaggy, PA-C      . alum & mag hydroxide-simeth (MAALOX/MYLANTA) 200-200-20 MG/5ML suspension 30 mL  30 mL Oral Q4H PRN Melbourne Abts W, PA-C      . buprenorphine-naloxone (SUBOXONE) 2-0.5 mg per SL tablet 2 tablet  2 tablet Sublingual TID WC & HS Laveda Abbe, NP   2 tablet at 08/16/20 1242  . haloperidol (HALDOL) tablet 15 mg  15 mg Oral TID Antonieta Pert, MD   15 mg at 08/16/20 1242   Or  . haloperidol lactate (HALDOL) injection 15 mg  15 mg Intramuscular TID Antonieta Pert, MD      . hydrochlorothiazide (HYDRODIURIL) tablet 25 mg  25 mg Oral q morning Antonieta Pert, MD   25 mg at 08/16/20 0915  . hydrOXYzine (ATARAX/VISTARIL) tablet 25 mg  25 mg Oral TID PRN Jaclyn Shaggy, PA-C   25 mg at 08/15/20 2120  . OLANZapine zydis (ZYPREXA) disintegrating tablet 10 mg  10 mg Oral Q8H PRN Antonieta Pert, MD       And  . LORazepam (ATIVAN) tablet 1 mg  1 mg Oral Q6H PRN Antonieta Pert, MD       And  . ziprasidone (GEODON) injection 20 mg  20 mg Intramuscular Q6H PRN Antonieta Pert, MD      . magnesium hydroxide (MILK OF MAGNESIA) suspension 30 mL  30 mL Oral Daily PRN Melbourne Abts W, PA-C      . nicotine (NICODERM CQ - dosed in mg/24 hours) patch 21 mg  21 mg Transdermal Daily Antonieta Pert, MD   21 mg at 08/13/20 1059  . risperiDONE (RISPERDAL M-TABS) disintegrating tablet 3 mg  3 mg Oral QHS Antonieta Pert, MD      . traZODone (DESYREL) tablet 50 mg  50 mg Oral QHS PRN Jaclyn Shaggy, PA-C   50 mg at 08/15/20 2120    Lab Results:  Results for orders placed or performed during the hospital encounter of 08/11/20 (from the past 48 hour(s))  CBC with Differential/Platelet     Status: Abnormal   Collection Time: 08/15/20  6:30 AM  Result Value Ref Range   WBC 10.8 (H) 4.0 - 10.5 K/uL   RBC 4.68 3.87 - 5.11 MIL/uL   Hemoglobin 13.3 12.0 - 15.0 g/dL   HCT 24.4 01.0 - 27.2 %    MCV 89.1 80.0 - 100.0 fL   MCH 28.4 26.0 - 34.0 pg   MCHC 31.9 30.0 - 36.0 g/dL   RDW 53.6 64.4 - 03.4 %   Platelets 354 150 - 400 K/uL   nRBC 0.0 0.0 - 0.2 %   Neutrophils Relative % 64 %  Neutro Abs 7.0 1.7 - 7.7 K/uL   Lymphocytes Relative 19 %   Lymphs Abs 2.0 0.7 - 4.0 K/uL   Monocytes Relative 16 %   Monocytes Absolute 1.7 (H) 0.1 - 1.0 K/uL   Eosinophils Relative 0 %   Eosinophils Absolute 0.0 0.0 - 0.5 K/uL   Basophils Relative 1 %   Basophils Absolute 0.1 0.0 - 0.1 K/uL   Immature Granulocytes 0 %   Abs Immature Granulocytes 0.04 0.00 - 0.07 K/uL    Comment: Performed at [facility], [address], [place], [place] [address]  Comprehensive metabolic panel     Status: Abnormal   Collection Time: 08/15/20  6:30 AM  Result Value Ref Range   Sodium 134 (L) 135 - 145 mmol/L   Potassium 3.9 3.5 - 5.1 mmol/L   Chloride 98 98 - 111 mmol/L   CO2 24 22 - 32 mmol/L   Glucose, Bld 122 (H) 70 - 99 mg/dL    Comment: Glucose reference range applies only to samples taken after fasting for at least 8 hours.   BUN 23 (H) 6 - 20 mg/dL   Creatinine, Ser 1.76 0.44 - 1.00 mg/dL   Calcium 9.2 8.9 - 16.0 mg/dL   Total Protein 8.5 (H) 6.5 - 8.1 g/dL   Albumin 4.1 3.5 - 5.0 g/dL   AST 23 15 - 41 U/L   ALT 16 0 - 44 U/L   Alkaline Phosphatase 82 38 - 126 U/L   Total Bilirubin 1.5 (H) 0.3 - 1.2 mg/dL   GFR, Estimated >73 >71 mL/min    Comment: (NOTE) Calculated using the CKD-EPI Creatinine Equation (2021)    Anion gap 12 5 - 15    Comment: Performed at [facility], [address], [place], [place] [address]  TSH     Status: None   Collection Time: 08/15/20  6:33 PM  Result Value Ref Range   TSH 2.090 0.350 - 4.500 uIU/mL    Comment: Performed by a 3rd Generation assay with a functional sensitivity of <=0.01 uIU/mL. Performed at [facility] [address] [place], [place] [address]     Blood Alcohol level:  Lab Results   Component Value Date   ETH <10 08/08/2020    Metabolic Disorder Labs: No results found for: HGBA1C, MPG No results found for: PROLACTIN No results found for: CHOL, TRIG, HDL, CHOLHDL, VLDL, LDLCALC  Physical Findings: AIMS: Facial and Oral Movements Muscles of Facial Expression: None, normal Lips and Perioral Area: None, normal Jaw: None, normal Tongue: None, normal,Extremity Movements Upper (arms, wrists, hands, fingers): None, normal Lower (legs, knees, ankles, toes): None, normal, Trunk Movements Neck, shoulders, hips: None, normal, Overall Severity Severity of abnormal movements (highest score from questions above): None, normal Incapacitation due to abnormal movements: None, normal Patient's awareness of abnormal movements (rate only patient's report): No Awareness, Dental Status Current problems with teeth and/or dentures?: No Does patient usually wear dentures?: No  CIWA:    COWS:     Musculoskeletal: Strength & Muscle Tone: within normal limits Gait & Station: normal Patient leans: N/A  Psychiatric Specialty Exam:  Presentation  General Appearance: Disheveled  Eye Contact:Fair  Speech:Blocked  Speech Volume:Decreased  Handedness:Right   Mood and Affect  Mood:Dysphoric  Affect:Flat   Thought Process  Thought Processes:Goal Directed  Descriptions of Associations:Loose  Orientation:Partial  Thought Content:Delusions; Paranoid Ideation  History of Schizophrenia/Schizoaffective disorder:No  Duration of Psychotic Symptoms:Less than six months  Hallucinations:Hallucinations: Auditory  Ideas of Reference:Delusions; Paranoia  Suicidal Thoughts:Suicidal Thoughts: No  Homicidal Thoughts:Homicidal Thoughts: No   Sensorium  Memory:Immediate Poor; Recent Poor; Remote Poor  Judgment:Poor  Insight:Lacking   Executive Functions  Concentration:Fair  Attention Span:Fair  Recall:Poor  Fund of  Knowledge:Fair  Language:Fair   Psychomotor Activity  Psychomotor Activity:Psychomotor Activity: Restlessness   Assets  Assets:Desire for Improvement; Resilience; Social Support; Housing   Sleep  Sleep:Sleep: Good Number of Hours of Sleep: 7.75    Physical Exam: Physical Exam Vitals and nursing note reviewed.  HENT:     Head: Normocephalic and atraumatic.  Pulmonary:     Effort: Pulmonary effort is normal.  Neurological:     General: No focal deficit present.     Mental Status: She is alert.    ROS Blood pressure 129/88, pulse (!) 131, temperature 98.6 F (37 C), temperature source Oral, resp. rate 18, height 5\' 8"  (1.727 m), weight 124.7 kg, last menstrual period 07/18/2020, SpO2 98 %, unknown if currently breastfeeding. Body mass index is 41.81 kg/m.   Treatment Plan Summary: Daily contact with patient to assess and evaluate symptoms and progress in treatment, Medication management and Plan : Patient is seen and examined.  Patient is a 30 year old female with the above-stated past psychiatric history who is seen in follow-up.   Diagnosis: 1.  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 2.  Cannabis use disorder 3.  Tachycardia  Pertinent findings on examination today: 1.  She continues with thought blocking, paranoia. 2.  Her sleep still remains poor. 5.  She was at least more verbal today.  Plan: 1.  Continue Suboxone 4 mg sublingual 3 times daily for opiate dependence. 2.  Patient continues on forced medication protocol. 3.  Reorder EKG again. 4.  Continue haloperidol 15 mg p.o. or IM 3 times daily for psychosis. 5.  Continue hydrochlorothiazide 25 mg p.o. daily for hypertension. 6.  Continue Zyprexa agitation protocol as needed. 7.  Add Risperdal 2 mg p.o. nightly for psychosis and to assist with sleep. 8.  Increase trazodone to 100 mg p.o. nightly as needed insomnia. 9.  Disposition planning-in progress.  Antonieta PertGreg  Lawson Willie Plain, MD 08/16/2020, 12:55 PM

## 2020-08-16 NOTE — Tx Team (Signed)
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 acetaminophen (TYLENOL) tablet 650 mg  650 mg Oral Q6H PRN Jaclyn Shaggy, PA-C      . alum & mag hydroxide-simeth (MAALOX/MYLANTA) 200-200-20 MG/5ML suspension 30 mL  30 mL Oral Q4H PRN Melbourne Abts W, PA-C      . buprenorphine-naloxone (SUBOXONE) 2-0.5 mg per SL tablet 2 tablet  2 tablet Sublingual TID WC & HS Laveda Abbe, NP   2 tablet at 08/16/20 0915  . haloperidol (HALDOL) tablet 15 mg  15 mg Oral TID Antonieta Pert, MD   15 mg at 08/16/20 0915   Or  . haloperidol lactate (HALDOL) injection 15 mg  15 mg Intramuscular TID Antonieta Pert, MD      . hydrochlorothiazide (HYDRODIURIL) tablet 25 mg  25 mg Oral q morning Antonieta Pert, MD   25 mg at 08/16/20 0915  . hydrOXYzine (ATARAX/VISTARIL) tablet 25 mg  25 mg Oral TID PRN Jaclyn Shaggy, PA-C   25 mg at 08/15/20 2120  . OLANZapine zydis (ZYPREXA) disintegrating tablet 10 mg  10 mg Oral Q8H PRN Antonieta Pert, MD       And  . LORazepam (ATIVAN) tablet 1 mg  1 mg Oral Q6H PRN Antonieta Pert, MD       And  . ziprasidone (GEODON) injection 20 mg  20 mg Intramuscular Q6H PRN Antonieta Pert, MD      . magnesium hydroxide (MILK OF MAGNESIA) suspension 30 mL  30 mL Oral Daily PRN Melbourne Abts W, PA-C      . nicotine (NICODERM CQ - dosed in mg/24 hours) patch 21 mg  21 mg Transdermal Daily Antonieta Pert, MD   21 mg at 08/13/20 1059  . risperiDONE (RISPERDAL M-TABS) disintegrating tablet 3 mg  3 mg Oral QHS Antonieta Pert, MD      . traZODone (DESYREL) tablet 50 mg  50 mg  Oral QHS PRN Jaclyn Shaggy, PA-C   50 mg at 08/15/20 2120   PTA Medications: Medications Prior to Admission  Medication Sig Dispense Refill Last Dose  . cloNIDine (CATAPRES) 0.1 MG tablet Take 0.2 mg by mouth at bedtime.   Past Week at Unknown time  . hydrochlorothiazide (HYDRODIURIL) 25 MG tablet Take 50 mg by mouth every morning.   Past Week at Unknown time  . traZODone (DESYREL) 50 MG tablet Take 50-100 mg by mouth at bedtime as needed for sleep.   Past Week at Unknown time  . ACCU-CHEK FASTCLIX LANCETS MISC 1 Device by Does not apply route 4 (four) times daily. (Patient not taking: No sig reported) 100 each 2 Unknown at Unknown time  . cyclobenzaprine (FLEXERIL) 5 MG tablet Take 1 tablet (5 mg total) by mouth every 8 (eight) hours as needed for muscle spasms. (Patient not taking: No sig reported) 20 tablet 0 Unknown at Unknown time  . diphenhydrAMINE (BENADRYL) 25 MG tablet Take 25 mg by mouth as needed for allergies.   Unknown at Unknown time  . glucose blood (ACCU-CHEK GUIDE) test strip Use as instructed QID (Patient not taking: No sig reported) 100 each 12 Unknown at Unknown time  . hydrOXYzine (ATARAX/VISTARIL)  10 MG tablet Take 1 tablet (10 mg total) by mouth 3 (three) times daily as needed. (Patient not taking: No sig reported) 30 tablet 2 Unknown at Unknown time  . hydrOXYzine (VISTARIL) 50 MG capsule Take 50 mg by mouth 2 (two) times daily as needed for anxiety.   Unknown at Unknown time  . prenatal vitamin w/FE, FA (PRENATAL 1 + 1) 27-1 MG TABS tablet Take 1 tablet by mouth daily at 12 noon. (Patient not taking: No sig reported) 30 each 9 Unknown at Unknown time  . SUBOXONE 8-2 MG FILM Place 1.5 Film under the tongue 4 (four) times daily.   Unknown at Unknown time    Patient Stressors: Medication change or noncompliance Substance abuse Other: Patient reports feeling overwhelmed with life in general  Patient Strengths: Ability for insight Active sense of humor Average or above  average intelligence Capable of independent living Motivation for treatment/growth Supportive family/friends Work skills  Treatment Modalities: Medication Management, Group therapy, Case management,  1 to 1 session with clinician, Psychoeducation, Recreational therapy.   Physician Treatment Plan for Primary Diagnosis: Substance-induced psychotic disorder with delusions (HCC) Long Term Goal(s): Improvement in symptoms so as ready for discharge Improvement in symptoms so as ready for discharge   Short Term Goals: Compliance with prescribed medications will improve Ability to identify changes in lifestyle to reduce recurrence of condition will improve Ability to verbalize feelings will improve Ability to identify and develop effective coping behaviors will improve Compliance with prescribed medications will improve Ability to identify triggers associated with substance abuse/mental health issues will improve  Medication Management: Evaluate patient's response, side effects, and tolerance of medication regimen.  Therapeutic Interventions: 1 to 1 sessions, Unit Group sessions and Medication administration.  Evaluation of Outcomes: Progressing  Physician Treatment Plan for Secondary Diagnosis: Principal Problem:   Substance-induced psychotic disorder with delusions (HCC)  Long Term Goal(s): Improvement in symptoms so as ready for discharge Improvement in symptoms so as ready for discharge   Short Term Goals: Compliance with prescribed medications will improve Ability to identify changes in lifestyle to reduce recurrence of condition will improve Ability to verbalize feelings will improve Ability to identify and develop effective coping behaviors will improve Compliance with prescribed medications will improve Ability to identify triggers associated with substance abuse/mental health issues will improve     Medication Management: Evaluate patient's response, side effects, and  tolerance of medication regimen.  Therapeutic Interventions: 1 to 1 sessions, Unit Group sessions and Medication administration.  Evaluation of Outcomes: Progressing   RN Treatment Plan for Primary Diagnosis: Substance-induced psychotic disorder with delusions (HCC) Long Term Goal(s): Knowledge of disease and therapeutic regimen to maintain health will improve  Short Term Goals: Ability to participate in decision making will improve, Ability to verbalize feelings will improve, Ability to identify and develop effective coping behaviors will improve and Compliance with prescribed medications will improve  Medication Management: RN will administer medications as ordered by provider, will assess and evaluate patient's response and provide education to patient for prescribed medication. RN will report any adverse and/or side effects to prescribing provider.  Therapeutic Interventions: 1 on 1 counseling sessions, Psychoeducation, Medication administration, Evaluate responses to treatment, Monitor vital signs and CBGs as ordered, Perform/monitor CIWA, COWS, AIMS and Fall Risk screenings as ordered, Perform wound care treatments as ordered.  Evaluation of Outcomes: Progressing   LCSW Treatment Plan for Primary Diagnosis: Substance-induced psychotic disorder with delusions (HCC) Long Term Goal(s): Safe transition to appropriate next level of care at  discharge, Engage patient in therapeutic group addressing interpersonal concerns.  Short Term Goals: Engage patient in aftercare planning with referrals and resources, Increase ability to appropriately verbalize feelings, Increase emotional regulation, Facilitate acceptance of mental health diagnosis and concerns, Identify triggers associated with mental health/substance abuse issues and Increase skills for wellness and recovery  Therapeutic Interventions: Assess for all discharge needs, 1 to 1 time with Social worker, Explore available resources and  support systems, Assess for adequacy in community support network, Educate family and significant other(s) on suicide prevention, Complete Psychosocial Assessment, Interpersonal group therapy.  Evaluation of Outcomes: Progressing   Progress in Treatment: Attending groups: No. Participating in groups: No. Taking medication as prescribed: Yes. Toleration medication: Yes. Family/Significant other contact made: No, will contact:  Eilleen Kempf, mother Patient understands diagnosis: No. Discussing patient identified problems/goals with staff: No. Medical problems stabilized or resolved: Yes. Denies suicidal/homicidal ideation: Yes. Issues/concerns per patient self-inventory: No.   New problem(s) identified: No, Describe:  none reported  New Short Term/Long Term Goal(s): medication management for mood stabilization; elimination of SI thoughts; development of comprehensive mental wellness/sobriety plan   Patient Goals:  "To go home"  Discharge Plan or Barriers: Patient recently admitted. CSW will continue to follow and assess for appropriate referrals and possible discharge planning.    Reason for Continuation of Hospitalization: Depression Mania Medication stabilization Other; describe disorganized thoughts   Estimated Length of Stay: 3-5 days  Attendees: Patient: Susan English 08/16/2020   Physician: Clifton Custard, MD 08/16/2020   Nursing:  08/16/2020   RN Care Manager: 08/16/2020   Social Worker: Anson [place], LCSW 08/16/2020   Recreational Therapist:  08/16/2020   Other:  08/16/2020   Other:  08/16/2020   Other: 08/16/2020       Scribe for Treatment Team: Felizardo Hoffmann, LCSWA 08/16/2020 10:26 AM

## 2020-08-16 NOTE — Progress Notes (Signed)
   08/16/20 0500  Sleep  Number of Hours 4.75

## 2020-08-16 NOTE — Progress Notes (Signed)
   08/16/20 1500  Psych Admission Type (Psych Patients Only)  Admission Status Involuntary  Psychosocial Assessment  Patient Complaints Suspiciousness  Eye Contact Intense;Watchful  Facial Expression Anxious;Worried  Affect Anxious;Preoccupied  Speech Soft;Slow;Elective mutism  Interaction Cautious;Forwards little;Guarded;Minimal  Motor Activity Slow  Appearance/Hygiene Disheveled  Behavior Characteristics Cooperative  Mood Suspicious  Thought Process  Coherency Blocking;Concrete thinking;Disorganized  Content Preoccupation;Paranoia  Delusions Paranoid  Perception Derealization  Hallucination None reported or observed  Judgment Poor  Confusion Mild  Danger to Self  Current suicidal ideation? Denies  Danger to Others  Danger to Others None reported or observed

## 2020-08-16 NOTE — Progress Notes (Addendum)
Pt paranoid, delusional at times. Pt lucid at times, presents with thought blocking and confusion sometimes. Pt given PRN Trazodone and Vistaril per MAR with HS medications    08/16/20 2000  Psych Admission Type (Psych Patients Only)  Admission Status Involuntary  Psychosocial Assessment  Patient Complaints Suspiciousness;Worrying  Eye Contact Intense;Watchful  Facial Expression Anxious;Worried  Affect Anxious;Preoccupied  Speech Soft;Slow;Elective mutism  Interaction Cautious;Forwards little;Guarded;Minimal  Motor Activity Slow  Appearance/Hygiene Disheveled  Behavior Characteristics Cooperative  Mood Suspicious  Thought Process  Coherency Blocking;Concrete thinking;Disorganized  Content Preoccupation;Paranoia  Delusions Paranoid  Perception Derealization  Hallucination None reported or observed  Judgment Poor  Confusion Mild  Danger to Self  Current suicidal ideation? Denies  Danger to Others  Danger to Others None reported or observed

## 2020-08-16 NOTE — [facility] Group Notes (Signed)
SPIRITUALITY GROUP NOTE   Spirituality group facilitated by Kathleen Argue, B[facility].   Group Description: Group focused on topic of hope. Patients participated in facilitated discussion around topic, connecting with one another around experiences and definitions for hope. Group members engaged with visual explorer photos, reflecting on what hope looks like for them today. Group engaged in discussion around how their definitions of hope are present today in hospital.   Modalities: Psycho-social ed, Adlerian, Narrative, MI   Patient Progress: [place] attended group and participated towards the beginning.  She was very tired and stated that her medications were making her drowsy.  Chaplain Dyanne Carrel, Bcc Pager, [phone] Office: [phone] 4:46 PM

## 2020-08-16 NOTE — Progress Notes (Signed)
Recreation Therapy Notes  Date: 4.8.22 Time: 1000 Location: 500 Hall Dayroom   Group Topic: Communication, Team Building, Problem Solving  Goal Area(s) Addresses:  Patient will effectively work with peer towards shared goal.  Patient will identify skills used to make activity successful.  Patient will share challenges and verbalize solution-driven approaches used. Patient will identify how skills used during activity can be used to reach post d/c goals.   Behavioral Response: Observant  Intervention: STEM Activity   Activity: [employer]. Patients were provided the following materials: 5 drinking straws, 5 rubber bands, 5 paper clips, 2 index cards and 2 drinking cups. Using the provided materials patients were asked to build a launching mechanism to launch a ping pong ball across the room, approximately 10 feet. Patients were divided into teams of 3-5. Instructions required all materials be incorporated into the device, functionality of items left to the peer group's discretion.  Education: Pharmacist, community, Scientist, physiological, Air cabin crew, Building control surveyor.   Education Outcome: Acknowledges education/In group clarification offered/Needs additional education.   Clinical Observations/Feedback:  Pt sat looking at her peers trying to figure out how to complete activity.  Pt was encouraged by LRT to assist peers but pt continued to just sit there.    Caroll Rancher, LRT/CTRS    Lillia Abed, Nachum Derossett A 08/16/2020 11:18 AM

## 2020-08-16 NOTE — [facility] Group Notes (Signed)
[facility] LCSW Group Therapy  08/16/2020 11:51 AM  Type of Therapy:  Group Therapy  Participation Level:  Minimal  Participation Quality:  Inattentive  Affect:  Flat  Cognitive:  Appropriate  Insight:  Developing/Improving  Engagement in Therapy:  Limited  Modes of Intervention:  Activity and Discussion  Summary of Progress/Problems: Pt attended group, however offered minimal participation and often took awhile to respond.   Synia Douglass A Glenis Musolf 08/16/2020, 11:51 AM

## 2020-08-17 MED ORDER — HALOPERIDOL LACTATE 5 MG/ML IJ SOLN
10.0000 mg | Freq: Three times a day (TID) | INTRAMUSCULAR | Status: DC
  Filled 2020-08-17 (×9): qty 2

## 2020-08-17 MED ORDER — HALOPERIDOL 5 MG PO TABS
10.0000 mg | ORAL_TABLET | Freq: Three times a day (TID) | ORAL | Status: DC
  Administered 2020-08-17 – 2020-08-18 (×4): 10 mg via ORAL
  Filled 2020-08-17 (×9): qty 2

## 2020-08-17 MED ORDER — RISPERIDONE 1 MG PO TBDP
1.0000 mg | ORAL_TABLET | Freq: Every day | ORAL | Status: DC
  Administered 2020-08-17 – 2020-08-18 (×2): 1 mg via ORAL
  Filled 2020-08-17 (×4): qty 1

## 2020-08-17 MED ORDER — RISPERIDONE 3 MG PO TBDP
3.0000 mg | ORAL_TABLET | Freq: Every day | ORAL | Status: DC
  Administered 2020-08-17: 3 mg via ORAL
  Filled 2020-08-17 (×3): qty 1

## 2020-08-17 MED ORDER — WHITE PETROLATUM EX OINT
TOPICAL_OINTMENT | CUTANEOUS | Status: AC
  Administered 2020-08-17: 1
  Filled 2020-08-17: qty 5

## 2020-08-17 NOTE — Progress Notes (Signed)
Dar Note: Patient presents with a flat affect and depressed mood.  Medications given as prescribed with lots of encouragement.  She remains guarded and paranoid with thought blocking.  Routine safety checks maintained.  Patient is safe on the unit.

## 2020-08-17 NOTE — Progress Notes (Signed)
   08/17/20 2035  COVID-19 Daily Checkoff  Have you had a fever (temp > 37.80C/100F)  in the past 24 hours?  No  If you have had runny nose, nasal congestion, sneezing in the past 24 hours, has it worsened? No  COVID-19 EXPOSURE  Have you traveled outside the state in the past 14 days? No  Have you been in contact with someone with a confirmed diagnosis of COVID-19 or PUI in the past 14 days without wearing appropriate PPE? No  Have you been living in the same home as a person with confirmed diagnosis of COVID-19 or a PUI (household contact)? No  Have you been diagnosed with COVID-19? No

## 2020-08-17 NOTE — [facility] Group Notes (Signed)
[facility] Group Notes:  (Nursing/MHT/Case Management/Adjunct)  Date:  08/17/2020  Time:  11:00 AM  Type of Therapy:  Group Therapy  Participation Level:  Minimal  Participation Quality:  Appropriate  Affect:  Flat  Cognitive:  Appropriate  Insight:  Improving  Engagement in Group:  Developing/Improving  Modes of Intervention:  Orientation  Summary of Progress/Problems: Pt goal is to get better so that she can go back home to her kids.   Susan English J Jeimy Bickert 08/17/2020, 11:00 AM

## 2020-08-17 NOTE — Progress Notes (Signed)
[facility]  08/17/2020 12:54 PM Susan English  MRN:  [id number] Subjective:  Patient is a 30 year old female who [facility] on 08/04/2020 with disorganization, rambling speech and tangential thoughts.  Objective: Patient is seen and examined.  Patient is a 30 year old female with the above-stated past psychiatric history who is seen in follow-up.  She actually appears slightly better today.  She still has thought blocking, but is able to initiate some conversation.  She remains guarded and paranoid, but has been able to be in the day room and sit comfortably and watch television.  Previously should be pacing on the hallway and staring at people.  She did sleep a bit better last night, and that may have been to me adding Risperdal to her treatment.  She has been on 2-3 antipsychotics so far, and has not really gotten any better with any of them except since I added the Risperdal.  She denies suicidal or homicidal ideation.  She denies auditory or visual hallucinations, but she continues to have thought blocking, and it still appears that she is having internal stimuli.  Review of nursing notes from last night showed that the patient was paranoid and delusional at times.  She had periods of lucidity at times.  She continued to present with thought blocking.  She received trazodone and Vistaril it with her at bedtime meds.  Her blood pressure stable, and her heart rate was between 128 and 112.  Review of her previous EKG showed a sinus tachycardia with a normal QTc interval.  She did sleep 6.5 hours last night.  Today we also discussed the need to check some laboratories, and I warned her that they would be drawing blood on her this evening.  No laboratories from 4/8.  Her TSH from 4/7 was 2.090.  Principal Problem: Substance-induced psychotic disorder with delusions (HCC) Diagnosis: Principal Problem:   Substance-induced psychotic  disorder with delusions (HCC)  Total Time spent with patient: 20 minutes  Past Psychiatric History: See admission H&P  Past Medical History:  Past Medical History:  Diagnosis Date  . Headache(784.0)   . 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 Hypertension   . Obesity     Past Surgical History:  Procedure Laterality Date  . CESAREAN SECTION    . ORIF ANKLE FRACTURE Right 12/22/2012   Procedure: OPEN REDUCTION INTERNAL FIXATION (ORIF) ANKLE FRACTURE;  Surgeon: Mable ParisJustin William Chandler, MD;  Location: [facility];  Service: Orthopedics;  Laterality: Right;  . sweat gland     Family History:  Family History  Problem Relation Age of Onset  . Hypertension Mother   . Diabetes Mother   . Diabetes Maternal Grandmother   . Breast cancer Paternal Grandmother    Family Psychiatric  History: See admission H&P Social History:  Social History   Substance and Sexual Activity  Alcohol Use No   Comment: occ     Social History   Substance and Sexual Activity  Drug Use Yes  . Types: Marijuana, Other-see comments   Comment: pt reports CBD use also    Social History   Socioeconomic History  . Marital status: Single    Spouse name: n/a  . Number of children: 2  . Years of education: Not on file  . Highest education level: Associate degree: occupational, Scientist, product/process developmenttechnical, or vocational program  Occupational History  . Not on file  Tobacco Use  .  Smoking status: Current Every Day Smoker    Packs/day: 0.50    Types: Cigarettes  . Smokeless tobacco: Never Used  Vaping Use  . Vaping Use: Never used  Substance and Sexual Activity  . Alcohol use: No    Comment: occ  . Drug use: Yes    Types: Marijuana, Other-see comments    Comment: pt reports CBD use also  . Sexual activity: Yes    Birth control/protection: Implant  Other Topics Concern  . Not on file  Social History Narrative  . Not on file   Social Determinants of Health    Financial Resource Strain: Not on file  Food Insecurity: Not on file  Transportation Needs: Not on file  Physical Activity: Not on file  Stress: Not on file  Social Connections: Not on file   Additional Social History:                         Sleep: Fair  Appetite:  Fair  Current Medications: Current Facility-Administered Medications  Medication Dose Route Frequency Provider Last Rate Last Admin  . acetaminophen (TYLENOL) tablet 650 mg  650 mg Oral Q6H PRN Jaclyn Shaggy, PA-C      . alum & mag hydroxide-simeth (MAALOX/MYLANTA) 200-200-20 MG/5ML suspension 30 mL  30 mL Oral Q4H PRN Melbourne Abts W, PA-C      . buprenorphine-naloxone (SUBOXONE) 2-0.5 mg per SL tablet 2 tablet  2 tablet Sublingual TID WC & HS Laveda Abbe, NP   2 tablet at 08/17/20 2979  . haloperidol (HALDOL) tablet 10 mg  10 mg Oral TID Antonieta Pert, MD       Or  . haloperidol lactate (HALDOL) injection 10 mg  10 mg Intramuscular TID Antonieta Pert, MD      . hydrochlorothiazide (HYDRODIURIL) tablet 25 mg  25 mg Oral q morning Antonieta Pert, MD   25 mg at 08/17/20 0753  . hydrOXYzine (ATARAX/VISTARIL) tablet 25 mg  25 mg Oral TID PRN Jaclyn Shaggy, PA-C   25 mg at 08/16/20 2101  . OLANZapine zydis (ZYPREXA) disintegrating tablet 10 mg  10 mg Oral Q8H PRN Antonieta Pert, MD       And  . LORazepam (ATIVAN) tablet 1 mg  1 mg Oral Q6H PRN Antonieta Pert, MD       And  . ziprasidone (GEODON) injection 20 mg  20 mg Intramuscular Q6H PRN Antonieta Pert, MD      . magnesium hydroxide (MILK OF MAGNESIA) suspension 30 mL  30 mL Oral Daily PRN Ladona Ridgel, Cody W, PA-C      . risperiDONE (RISPERDAL M-TABS) disintegrating tablet 1 mg  1 mg Oral Daily Antonieta Pert, MD      . risperiDONE (RISPERDAL M-TABS) disintegrating tablet 3 mg  3 mg Oral QHS Antonieta Pert, MD      . traZODone (DESYREL) tablet 50 mg  50 mg Oral QHS PRN Melbourne Abts W, PA-C   50 mg at 08/16/20 2101     Lab Results:  Results for orders placed or performed during the hospital encounter of 08/11/20 (from the past 48 hour(s))  TSH     Status: None   Collection Time: 08/15/20  6:33 PM  Result Value Ref Range   TSH 2.090 0.350 - 4.500 uIU/mL    Comment: Performed by a 3rd Generation assay with a functional sensitivity of <=0.01 uIU/mL. Performed at [facility], [address] [place], [place] [address]     Blood Alcohol level:  Lab Results  Component Value Date   ETH <10 08/08/2020    Metabolic Disorder Labs: No results found for: HGBA1C, MPG No results found for: PROLACTIN No results found for: CHOL, TRIG, HDL, CHOLHDL, VLDL, LDLCALC  Physical Findings: AIMS: Facial and Oral Movements Muscles of Facial Expression: None, normal Lips and Perioral Area: None, normal Jaw: None, normal Tongue: None, normal,Extremity Movements Upper (arms, wrists, hands, fingers): None, normal Lower (legs, knees, ankles, toes): None, normal, Trunk Movements Neck, shoulders, hips: None, normal, Overall Severity Severity of abnormal movements (highest score from questions above): None, normal Incapacitation due to abnormal movements: None, normal Patient's awareness of abnormal movements (rate only patient's report): No Awareness, Dental Status Current problems with teeth and/or dentures?: No Does patient usually wear dentures?: No  CIWA:    COWS:     Musculoskeletal: Strength & Muscle Tone: within normal limits Gait & Station: normal Patient leans: N/A  Psychiatric Specialty Exam:  Presentation  General Appearance: Disheveled  Eye Contact:Fair  Speech:Blocked  Speech Volume:Decreased  Handedness:Right   Mood and Affect  Mood:Dysphoric; Irritable  Affect:Flat   Thought Process  Thought Processes:Goal Directed  Descriptions of Associations:Loose  Orientation:Partial  Thought Content:Delusions; Paranoid Ideation  History of  Schizophrenia/Schizoaffective disorder:No  Duration of Psychotic Symptoms:Less than six months  Hallucinations:Hallucinations: Auditory  Ideas of Reference:Delusions; Paranoia  Suicidal Thoughts:Suicidal Thoughts: No  Homicidal Thoughts:Homicidal Thoughts: No   Sensorium  Memory:Immediate Poor; Recent Poor; Remote Poor  Judgment:Impaired  Insight:Lacking   Executive Functions  Concentration:Fair  Attention Span:Fair  Recall:Poor  Fund of Knowledge:Fair  Language:Fair   Psychomotor Activity  Psychomotor Activity:Psychomotor Activity: Normal   Assets  Assets:Desire for Improvement; Resilience   Sleep  Sleep:Sleep: Fair Number of Hours of Sleep: 6.5    Physical Exam: Physical Exam Vitals and nursing note reviewed.  HENT:     Head: Normocephalic and atraumatic.  Pulmonary:     Effort: Pulmonary effort is normal.  Neurological:     General: No focal deficit present.     Mental Status: She is alert and oriented to person, place, and time.    ROS Blood pressure 114/81, pulse (!) 112, temperature 98.4 F (36.9 C), temperature source Oral, resp. rate 20, height 5\' 8"  (1.727 m), weight 124.7 kg, last menstrual period 07/18/2020, SpO2 100 %, unknown if currently breastfeeding. Body mass index is 41.81 kg/m.   Treatment Plan Summary: Daily contact with patient to assess and evaluate symptoms and progress in treatment, Medication management and Plan : Patient is seen and examined.  Patient is a 30 year old female with the above-stated past psychiatric history who is seen in follow-up.   Diagnosis: 1.  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 2.  Cannabis use disorder  Pertinent findings on examination today: 1.  Slightly improved sleep. 2.  Slightly improved paranoia and guardedness. 3.  Still appears to be responding to internal stimuli and having thought blocking.  Plan: 1.  Decrease haloperidol to 10 mg p.o. or IM 3  times daily for psychosis. 2.  Increase Risperdal to 1 mg p.o. daily and 3 mg p.o. nightly for psychosis.  We will continue the transition to hopefully 1 antipsychotic alone depending on response. 3.  Continue hydrochlorothiazide 25 mg p.o. daily for hypertension. 4.  Continue buprenorphine 4 mg sublingual 3 times daily for opiate dependence. 5.  Get hemoglobin A1c and lipid panel today. 6.  Continue trazodone 50 mg p.o. nightly as  needed insomnia. 7.  Disposition planning-in progress.  Antonieta Pert, MD 08/17/2020, 12:54 PM

## 2020-08-17 NOTE — Progress Notes (Signed)
   08/17/20 0500  Sleep  Number of Hours 6.5

## 2020-08-17 NOTE — [facility] Group Notes (Signed)
  [facility] LCSW Group Therapy Note  Date/Time:  08/17/2020 11:15AM-12:00PM  Type of Therapy and Topic:  Group Therapy:  Feelings About Hospitalization  Participation Level:  Minimal   Description of Group This process group involved patients discussing their feelings related to being hospitalized, as well as the benefits they see to being in the hospital.  These feelings and benefits were itemized.  The group then brainstormed specific ways in which they could seek those same benefits when they discharge and return home.  Therapeutic Goals 1. Patient will identify and describe positive and negative feelings related to hospitalization 2. Patient will verbalize benefits of hospitalization to themselves personally 3. Patients will brainstorm together ways they can obtain similar benefits in the outpatient setting, identify barriers to wellness and possible solutions  Summary of Patient Progress:  The patient expressed her primary feelings about being hospitalized are "I don't understand why I'm here."  As other patients answered various questions in group and she was asked directly, she always stated "I don't know."  Her affect was flat and she did not demonstrate understanding of the topic.  Therapeutic Modalities Cognitive Behavioral Therapy Motivational Interviewing    Ambrose Mantle, LCSW 08/17/2020, 3:51 PM

## 2020-08-17 NOTE — Progress Notes (Signed)
Adult Psychoeducational Group Note  Date:  08/17/2020 Time:  10:13 PM  Group Topic/Focus:  Wrap-Up Group:   The focus of this group is to help patients review their daily goal of treatment and discuss progress on daily workbooks.  Participation Level:  Minimal  Participation Quality:  Inattentive  Affect:  Flat  Cognitive:  Disorganized and Confused  Insight: Limited  Engagement in Group:  Lacking and Limited  Modes of Intervention:  Discussion  Additional Comments:  Pt not responsive with most questions. Pt appeared to have though blocking   Susan English 08/17/2020, 10:13 PM

## 2020-08-18 MED ORDER — WHITE PETROLATUM EX OINT
TOPICAL_OINTMENT | CUTANEOUS | Status: AC
  Filled 2020-08-18: qty 10

## 2020-08-18 MED ORDER — RISPERIDONE 2 MG PO TBDP
3.0000 mg | ORAL_TABLET | Freq: Every day | ORAL | Status: DC
  Administered 2020-08-19: 3 mg via ORAL
  Filled 2020-08-18 (×3): qty 1

## 2020-08-18 MED ORDER — RISPERIDONE 2 MG PO TBDP
2.0000 mg | ORAL_TABLET | ORAL | Status: AC
  Administered 2020-08-18: 2 mg via ORAL
  Filled 2020-08-18: qty 1

## 2020-08-18 MED ORDER — HALOPERIDOL 5 MG PO TABS
10.0000 mg | ORAL_TABLET | Freq: Every day | ORAL | Status: DC
  Administered 2020-08-18: 10 mg via ORAL
  Filled 2020-08-18 (×3): qty 2

## 2020-08-18 MED ORDER — HALOPERIDOL LACTATE 5 MG/ML IJ SOLN
10.0000 mg | Freq: Every day | INTRAMUSCULAR | Status: DC
  Filled 2020-08-18 (×3): qty 2

## 2020-08-18 MED ORDER — BUPRENORPHINE HCL-NALOXONE HCL 2-0.5 MG SL SUBL
1.0000 | SUBLINGUAL_TABLET | Freq: Three times a day (TID) | SUBLINGUAL | Status: DC
  Administered 2020-08-18 – 2020-08-24 (×23): 1 via SUBLINGUAL
  Filled 2020-08-18 (×23): qty 1

## 2020-08-18 MED ORDER — RISPERIDONE 2 MG PO TBDP
4.0000 mg | ORAL_TABLET | Freq: Every day | ORAL | Status: DC
  Administered 2020-08-18 – 2020-08-19 (×2): 4 mg via ORAL
  Filled 2020-08-18 (×5): qty 2

## 2020-08-18 MED ORDER — HALOPERIDOL 5 MG PO TABS
10.0000 mg | ORAL_TABLET | Freq: Every day | ORAL | Status: DC
  Administered 2020-08-19: 10 mg via ORAL
  Filled 2020-08-18 (×3): qty 2

## 2020-08-18 NOTE — Progress Notes (Signed)
   08/18/20 2105  COVID-19 Daily Checkoff  Have you had a fever (temp > 37.80C/100F)  in the past 24 hours?  No  If you have had runny nose, nasal congestion, sneezing in the past 24 hours, has it worsened? No  COVID-19 EXPOSURE  Have you traveled outside the state in the past 14 days? No  Have you been in contact with someone with a confirmed diagnosis of COVID-19 or PUI in the past 14 days without wearing appropriate PPE? No  Have you been living in the same home as a person with confirmed diagnosis of COVID-19 or a PUI (household contact)? No  Have you been diagnosed with COVID-19? No

## 2020-08-18 NOTE — [facility] Group Notes (Signed)
Adult Psychoeducational Group Not Date:  08/18/2020 Time:  0900-1045 Group Topic/Focus: PROGRESSIVE RELAXATION. A group where deep breathing is taught and tensing and relaxation muscle groups is used. Imagery is used as well.  Pts are asked to imagine 3 pillars that hold them up when they are not able to hold themselves up.  Participation Level:  Did not attend  Susan English A    

## 2020-08-18 NOTE — [facility] Group Notes (Signed)
[facility] LCSW Group Therapy Note  Date/Time:  08/18/2020  11:00AM-12:00PM  Type of Therapy and Topic:  Group Therapy:  Music and Mood  Participation Level:  Minimal   Description of Group: In this process group, members listened to a variety of genres of music and identified that different types of music evoke different responses.  Patients were encouraged to identify music that was soothing for them and music that was energizing for them.  Patients discussed how this knowledge can help with wellness and recovery in various ways including managing depression and anxiety as well as encouraging healthy sleep habits.    Therapeutic Goals: Patients will explore the impact of different varieties of music on mood Patients will verbalize the thoughts they have when listening to different types of music Patients will identify music that is soothing to them as well as music that is energizing to them Patients will discuss how to use this knowledge to assist in maintaining wellness and recovery Patients will explore the use of music as a coping skill  Summary of Patient Progress:  At the beginning of group, patient expressed that she felt sleepy.  She listened but often appeared to doze off.  At the end of group, patient expressed that she was still sleepy but "I like to participate."    Therapeutic Modalities: Solution Focused Brief Therapy Activity   Ambrose Mantle, LCSW

## 2020-08-18 NOTE — Plan of Care (Signed)
  Problem: Clinical Measurements: Goal: Will remain free from infection Outcome: Progressing Goal: Diagnostic test results will improve Outcome: Progressing   Problem: Activity: Goal: Risk for activity intolerance will decrease Outcome: Progressing   

## 2020-08-18 NOTE — Progress Notes (Signed)
Adult Psychoeducational Group Note  Date:  08/18/2020 Time:  10:10 PM  Group Topic/Focus:  Wrap-Up Group:   The focus of this group is to help patients review their daily goal of treatment and discuss progress on daily workbooks.  Participation Level:  Did Not Attend  Participation Quality:  Did Not Attend  Affect:  Did Not Attend  Cognitive:  Did Not Attend  Insight: None  Engagement in Group:  Did Not Attend  Modes of Intervention:  Did Not Attend  Additional Comments:  Pt did not attend evening wrap up group tonight.  Felipa Furnace 08/18/2020, 10:10 PM

## 2020-08-18 NOTE — [facility] Group Notes (Signed)
Psychoeducational Group Note  Date: 08-18-20 Time:  1300  Group Topic/Focus:  Making Healthy Choices:   The focus of this group is to help patients identify negative/unhealthy choices they were using prior to admission and identify positive/healthier coping strategies to replace them upon discharge.In this group, patients started asking about the brain and how the brain works with and how the chemicals work for those who use substances, the pros and cons of saboxone.  Participation Level:  Did not attend   Munir Victorian A  

## 2020-08-18 NOTE — Progress Notes (Signed)
   08/17/20 2047  Psych Admission Type (Psych Patients Only)  Admission Status Involuntary  Psychosocial Assessment  Patient Complaints Suspiciousness  Eye Contact Intense;Watchful  Facial Expression Anxious;Worried  Affect Anxious;Preoccupied  Speech Elective mutism  Interaction Cautious;Guarded;Minimal  Motor Activity Slow  Appearance/Hygiene Disheveled  Behavior Characteristics Guarded  Mood Suspicious;Preoccupied  Thought Process  Coherency Blocking;Concrete thinking;Disorganized  Content Preoccupation;Paranoia  Delusions Paranoid  Perception Derealization  Hallucination None reported or observed  Judgment Poor  Confusion Mild  Danger to Self  Current suicidal ideation? Denies  Danger to Others  Danger to Others None reported or observed

## 2020-08-18 NOTE — Progress Notes (Signed)
Patient has been isolative to her room other than coming up to take her medications. She did talk a little more than last night but still has thought blocking. Writer encouraged her to take care of her hygiene needs and was given needed items. She is calm and pleasant but just slow to respond currently.

## 2020-08-18 NOTE — Progress Notes (Signed)
Progress note    08/18/20 0910  Psych Admission Type (Psych Patients Only)  Admission Status Involuntary  Psychosocial Assessment  Patient Complaints Confusion;Suspiciousness  Eye Contact Intense;Watchful  Facial Expression Anxious;Blank;Flat;Pensive;Worried  Affect Anxious;Preoccupied  Speech Soft;Elective mutism  Interaction Cautious;Forwards little;Guarded;Minimal  Motor Activity Fidgety;Slow  Appearance/Hygiene In hospital gown  Behavior Characteristics Cooperative;Anxious;Guarded  Mood Anxious;Suspicious;Apprehensive;Preoccupied;Pleasant  Thought Process  Coherency Blocking;Disorganized  Content Preoccupation;Paranoia  Delusions Paranoid  Perception Derealization;Hallucinations  Hallucination Auditory  Judgment Poor  Confusion Mild  Danger to Self  Current suicidal ideation? Denies  Danger to Others  Danger to Others None reported or observed

## 2020-08-18 NOTE — Progress Notes (Addendum)
[facility] MD Progress Note  08/18/2020 3:10 PM Susan English  MRN:  [id number] Subjective:  Patient is a 30 year old female who originally presented to the [facility] emergency department on 08/04/2020 with disorganization, rambling speech and tangential thoughts.  Objective: Patient is seen and examined.  Patient is a 30 year old female with the above-stated past psychiatric history who is seen in follow-up.  This morning she looks like she did 2 days ago.  That may have been because I decreased her Haldol and increased her Risperdal.  She is not standing in the hallway as frequently, and has been able to sit in a chair.  She still remains quite paranoid.  She continues to have thought blocking.  Yesterday she was able to carry on somewhat of a conversation but not able to do that as well today.  Her blood pressure is stable, but she remains tachycardic.  Her heart rate this morning was rated at 128/112.  Unfortunately we still do not have an EKG on her.  She did sleep 6.25 hours last night.  Principal Problem: Substance-induced psychotic disorder with delusions (HCC) Diagnosis: Principal Problem:   Substance-induced psychotic disorder with delusions (HCC)  Total Time spent with patient: 15 minutes  Past Psychiatric History: See admission H&P  Past Medical History:  Past Medical History:  Diagnosis Date  . Headache(784.0)   . 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 Hypertension   . Obesity     Past Surgical History:  Procedure Laterality Date  . CESAREAN SECTION    . ORIF ANKLE FRACTURE Right 12/22/2012   Procedure: OPEN REDUCTION INTERNAL FIXATION (ORIF) ANKLE FRACTURE;  Surgeon: Mable Paris, MD;  Location: [facility];  Service: Orthopedics;  Laterality: Right;  . sweat gland     Family History:  Family History  Problem Relation Age of Onset  . Hypertension Mother   . Diabetes Mother   . Diabetes Maternal  Grandmother   . Breast cancer Paternal Grandmother    Family Psychiatric  History: See admission H&P Social History:  Social History   Substance and Sexual Activity  Alcohol Use No   Comment: occ     Social History   Substance and Sexual Activity  Drug Use Yes  . Types: Marijuana, Other-see comments   Comment: pt reports CBD use also    Social History   Socioeconomic History  . Marital status: Single    Spouse name: n/a  . Number of children: 2  . Years of education: Not on file  . Highest education level: Associate degree: occupational, Scientist, product/process development, or vocational program  Occupational History  . Not on file  Tobacco Use  . Smoking status: Current Every Day Smoker    Packs/day: 0.50    Types: Cigarettes  . Smokeless tobacco: Never Used  Vaping Use  . Vaping Use: Never used  Substance and Sexual Activity  . Alcohol use: No    Comment: occ  . Drug use: Yes    Types: Marijuana, Other-see comments    Comment: pt reports CBD use also  . Sexual activity: Yes    Birth control/protection: Implant  Other Topics Concern  . Not on file  Social History Narrative  . Not on file   Social Determinants of Health   Financial Resource Strain: Not on file  Food Insecurity: Not on file  Transportation Needs: Not on file  Physical Activity: Not on file  Stress: Not on file  Social Connections: Not on file   Additional Social History:                         Sleep: Good  Appetite:  Fair  Current Medications: Current Facility-Administered Medications  Medication Dose Route Frequency Provider Last Rate Last Admin  . acetaminophen (TYLENOL) tablet 650 mg  650 mg Oral Q6H PRN Jaclyn Shaggy, PA-C   650 mg at 08/18/20 7741  . alum & mag hydroxide-simeth (MAALOX/MYLANTA) 200-200-20 MG/5ML suspension 30 mL  30 mL Oral Q4H PRN Melbourne Abts W, PA-C      . buprenorphine-naloxone (SUBOXONE) 2-0.5 mg per SL tablet 2 tablet  2 tablet Sublingual TID WC & HS Laveda Abbe, NP   2 tablet at 08/18/20 1243  . [START ON 08/19/2020] haloperidol (HALDOL) tablet 10 mg  10 mg Oral Daily Antonieta Pert, MD       Or  . Melene Muller ON 08/19/2020] haloperidol lactate (HALDOL) injection 10 mg  10 mg Intramuscular Daily Antonieta Pert, MD      . haloperidol (HALDOL) tablet 10 mg  10 mg Oral QHS Antonieta Pert, MD       Or  . haloperidol lactate (HALDOL) injection 10 mg  10 mg Intramuscular QHS Antonieta Pert, MD      . hydrochlorothiazide (HYDRODIURIL) tablet 25 mg  25 mg Oral q morning Antonieta Pert, MD   25 mg at 08/18/20 2878  . hydrOXYzine (ATARAX/VISTARIL) tablet 25 mg  25 mg Oral TID PRN Jaclyn Shaggy, PA-C   25 mg at 08/17/20 2001  . OLANZapine zydis (ZYPREXA) disintegrating tablet 10 mg  10 mg Oral Q8H PRN Antonieta Pert, MD       And  . LORazepam (ATIVAN) tablet 1 mg  1 mg Oral Q6H PRN Antonieta Pert, MD       And  . ziprasidone (GEODON) injection 20 mg  20 mg Intramuscular Q6H PRN Antonieta Pert, MD      . magnesium hydroxide (MILK OF MAGNESIA) suspension 30 mL  30 mL Oral Daily PRN Melbourne Abts W, PA-C      . risperiDONE (RISPERDAL M-TABS) disintegrating tablet 2 mg  2 mg Oral NOW Antonieta Pert, MD      . Melene Muller ON 08/19/2020] risperiDONE (RISPERDAL M-TABS) disintegrating tablet 3 mg  3 mg Oral Daily Antonieta Pert, MD      . risperiDONE (RISPERDAL M-TABS) disintegrating tablet 4 mg  4 mg Oral QHS Antonieta Pert, MD      . traZODone (DESYREL) tablet 50 mg  50 mg Oral QHS PRN Melbourne Abts W, PA-C   50 mg at 08/17/20 2000    Lab Results: No results found for this or any previous visit (from the past 48 hour(s)).  Blood Alcohol level:  Lab Results  Component Value Date   ETH <10 08/08/2020    Metabolic Disorder Labs: No results found for: HGBA1C, MPG No results found for: PROLACTIN No results found for: CHOL, TRIG, HDL, CHOLHDL, VLDL, LDLCALC  Physical Findings: AIMS: Facial and Oral Movements Muscles of  Facial Expression: None, normal Lips and Perioral Area: None, normal Jaw: None, normal Tongue: None, normal,Extremity Movements Upper (arms, wrists, hands, fingers): None, normal Lower (legs, knees, ankles, toes): None, normal, Trunk Movements Neck, shoulders, hips: None, normal, Overall Severity Severity of abnormal movements (highest score from questions above): None, normal Incapacitation due to abnormal movements: None, normal Patient's awareness  of abnormal movements (rate only patient's report): No Awareness, Dental Status Current problems with teeth and/or dentures?: No Does patient usually wear dentures?: No  CIWA:    COWS:     Musculoskeletal: Strength & Muscle Tone: within normal limits Gait & Station: normal Patient leans: N/A  Psychiatric Specialty Exam:  Presentation  General Appearance: Disheveled  Eye Contact:Fair  Speech:Normal Rate  Speech Volume:Decreased  Handedness:Right   Mood and Affect  Mood:Dysphoric; Irritable  Affect:Flat   Thought Process  Thought Processes:Goal Directed  Descriptions of Associations:Loose  Orientation:Partial  Thought Content:Delusions; Paranoid Ideation  History of Schizophrenia/Schizoaffective disorder:No  Duration of Psychotic Symptoms:Less than six months  Hallucinations:Hallucinations: Auditory  Ideas of Reference:Delusions; Paranoia  Suicidal Thoughts:Suicidal Thoughts: No  Homicidal Thoughts:Homicidal Thoughts: No   Sensorium  Memory:Immediate Poor; Recent Poor; Remote Poor  Judgment:Impaired  Insight:Lacking   Executive Functions  Concentration:Fair  Attention Span:Fair  Recall:Fair  Fund of Knowledge:Fair  Language:Fair   Psychomotor Activity  Psychomotor Activity:Psychomotor Activity: Normal   Assets  Assets:Housing; Resilience; Desire for Improvement   Sleep  Sleep:Sleep: Fair Number of Hours of Sleep: 6.5    Physical Exam: Physical Exam Vitals and nursing note  reviewed.  HENT:     Head: Normocephalic and atraumatic.  Pulmonary:     Effort: Pulmonary effort is normal.  Neurological:     General: No focal deficit present.     Mental Status: She is alert and oriented to person, place, and time.    ROS Blood pressure 114/81, pulse (!) 112, temperature 98.4 F (36.9 C), temperature source Oral, resp. rate 20, height 5\' 8"  (1.727 m), weight 124.7 kg, SpO2 100 %, unknown if currently breastfeeding. Body mass index is 41.81 kg/m.   Treatment Plan Summary: Daily contact with patient to assess and evaluate symptoms and progress in treatment, Medication management and Plan : Patient is seen and examined.  Patient is a 30 year old female with the above-stated past psychiatric history who is seen in follow-up.   Diagnosis: 1.  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  The longer this goes on she appears to be more schizophrenic. 2.  Cannabis use disorder 3.  Tachycardia  Pertinent findings on examination today: 1.  Unfortunately she is regressed back to being flat and paranoid today. 2.  Sleep is improved. 3.  Remains tachycardic but still no EKG in the chart.  Plan: 1.  Decrease buprenorphine to 1 tablet p.o. 3 times daily with food for opiate dependence.  This is by chance if it is affecting her affect at all. 2.  Reorder EKG again. 3.  Decrease Haldol to 10 mg p.o. or IM twice daily for psychosis. 3.  Reorder hemoglobin A1c, lipid panel and chemistries for this evening. 4.  Add fluoxetine 10 mg p.o. daily in case any of this is related to a mood disorder. 5.  Increase Risperdal to 3 mg p.o. daily and 4 mg p.o. nightly for psychosis and mood stability. 6.  Continue trazodone 50 mg p.o. nightly as needed insomnia. 7.  Continue hydrochlorothiazide 25 mg p.o daily for hypertension. 8.  Disposition planning-progress.  36, MD 08/18/2020, 3:10 PM   Addendum: EKG shows sinus rhthym at rate 96,  normal QTc interval

## 2020-08-19 LAB — CBC WITH DIFFERENTIAL/PLATELET
Abs Immature Granulocytes: 0.02 10*3/uL (ref 0.00–0.07)
Basophils Absolute: 0.1 10*3/uL (ref 0.0–0.1)
Basophils Relative: 1 %
Eosinophils Absolute: 0.1 10*3/uL (ref 0.0–0.5)
Eosinophils Relative: 1 %
HCT: 40.7 % (ref 36.0–46.0)
Hemoglobin: 13.1 g/dL (ref 12.0–15.0)
Immature Granulocytes: 0 %
Lymphocytes Relative: 35 %
Lymphs Abs: 3.1 10*3/uL (ref 0.7–4.0)
MCH: 27.9 pg (ref 26.0–34.0)
MCHC: 32.2 g/dL (ref 30.0–36.0)
MCV: 86.8 fL (ref 80.0–100.0)
Monocytes Absolute: 1.6 10*3/uL — ABNORMAL HIGH (ref 0.1–1.0)
Monocytes Relative: 18 %
Neutro Abs: 4.2 10*3/uL (ref 1.7–7.7)
Neutrophils Relative %: 45 %
Platelets: 359 10*3/uL (ref 150–400)
RBC: 4.69 MIL/uL (ref 3.87–5.11)
RDW: 13.4 % (ref 11.5–15.5)
WBC: 9.1 10*3/uL (ref 4.0–10.5)
nRBC: 0 % (ref 0.0–0.2)

## 2020-08-19 LAB — LIPID PANEL
Cholesterol: 169 mg/dL (ref 0–200)
HDL: 37 mg/dL — ABNORMAL LOW (ref >40)
LDL Cholesterol: 120 mg/dL — ABNORMAL HIGH (ref 0–99)
Total CHOL/HDL Ratio: 4.6 RATIO
Triglycerides: 62 mg/dL (ref <150)
VLDL: 12 mg/dL (ref 0–40)

## 2020-08-19 LAB — COMPREHENSIVE METABOLIC PANEL
ALT: 16 U/L (ref 0–44)
AST: 19 U/L (ref 15–41)
Albumin: 4.2 g/dL (ref 3.5–5.0)
Alkaline Phosphatase: 82 U/L (ref 38–126)
Anion gap: 11 (ref 5–15)
BUN: 18 mg/dL (ref 6–20)
CO2: 27 mmol/L (ref 22–32)
Calcium: 9.6 mg/dL (ref 8.9–10.3)
Chloride: 96 mmol/L — ABNORMAL LOW (ref 98–111)
Creatinine, Ser: 0.77 mg/dL (ref 0.44–1.00)
GFR, Estimated: >60 mL/min (ref >60)
Glucose, Bld: 119 mg/dL — ABNORMAL HIGH (ref 70–99)
Potassium: 3.4 mmol/L — ABNORMAL LOW (ref 3.5–5.1)
Sodium: 134 mmol/L — ABNORMAL LOW (ref 135–145)
Total Bilirubin: 1.2 mg/dL (ref 0.3–1.2)
Total Protein: 8.4 g/dL — ABNORMAL HIGH (ref 6.5–8.1)

## 2020-08-19 LAB — HEMOGLOBIN A1C
Hgb A1c MFr Bld: 5.4 % (ref 4.8–5.6)
Mean Plasma Glucose: 108.28 mg/dL

## 2020-08-19 MED ORDER — RISPERIDONE 3 MG PO TBDP
3.0000 mg | ORAL_TABLET | Freq: Every day | ORAL | Status: DC
  Administered 2020-08-20 – 2020-08-22 (×3): 3 mg via ORAL
  Filled 2020-08-19 (×5): qty 1

## 2020-08-19 MED ORDER — RISPERIDONE 2 MG PO TBDP
2.0000 mg | ORAL_TABLET | ORAL | Status: AC
  Administered 2020-08-19: 2 mg via ORAL
  Filled 2020-08-19 (×2): qty 1

## 2020-08-19 MED ORDER — HALOPERIDOL LACTATE 5 MG/ML IJ SOLN
5.0000 mg | Freq: Two times a day (BID) | INTRAMUSCULAR | Status: DC
  Filled 2020-08-19 (×6): qty 1

## 2020-08-19 MED ORDER — HALOPERIDOL 5 MG PO TABS
5.0000 mg | ORAL_TABLET | Freq: Two times a day (BID) | ORAL | Status: DC
  Administered 2020-08-19 – 2020-08-20 (×2): 5 mg via ORAL
  Filled 2020-08-19 (×6): qty 1

## 2020-08-19 NOTE — Progress Notes (Signed)
Recreation Therapy Notes  Date: 4.11.22 Time: 2035-5974 Location: [address] Dayroom  Group Topic: Coping Skills  Goal Area(s) Addresses:  Patient will identify positive coping skills. Patient will identify benefit of using coping skills.  Behavioral Response:  Minimal  Intervention: Worksheet, Pencils  Activity: Mind Map.  LRT and patients filled out the first eight boxes (communication, anxiety, mania, depression, dreams, stress, lack of sleep and hunger) of the mind map together. Patients were then given time to come up with coping skills for each circumstance.  Patients would then come back together as a group and LRT would write the coping skills on the board.  Education:Coping Skills, Discharge Planning.   Education Outcome: Acknowledges understanding/In group clarification offered/Needs additional education.   Clinical Observations/Feedback: Pt filled out most of her sheet.  Pt did not engaged with identifying coping skills.  Pt stayed to herself and appeared to be listening.  Pt was quiet during group session.   Caroll Rancher, LRT/CTRS        Caroll Rancher A 08/19/2020 11:25 AM

## 2020-08-19 NOTE — Progress Notes (Signed)
Pt stayed in her room much of the evening. Pt given PRN Trazodone and Vistaril per MAR with HS medication    08/19/20 2200  Psych Admission Type (Psych Patients Only)  Admission Status Involuntary  Psychosocial Assessment  Patient Complaints Anxiety  Eye Contact Intense;Watchful  Facial Expression Blank;Flat;Pensive;Worried  Affect Anxious;Preoccupied  Camera operator;Slow  Appearance/Hygiene In hospital gown  Behavior Characteristics Guarded  Mood Suspicious;Preoccupied  Thought Process  Coherency Blocking;Disorganized  Content Preoccupation;Paranoia  Delusions Paranoid  Perception Derealization;Hallucinations  Hallucination Auditory  Judgment Poor  Confusion Mild  Danger to Self  Current suicidal ideation? Denies  Danger to Others  Danger to Others None reported or observed

## 2020-08-19 NOTE — [facility] Group Notes (Signed)
LCSW Group Therapy Note  Type of Therapy/Topic: Group Therapy: Six Dimensions of Wellness  Participation Level: Did Not Attend  Description of Group: This group will address the concept of wellness and the six concepts of wellness: occupational, physical, social, intellectual, spiritual, and emotional. Patients will be encouraged to process areas in their lives that are out of balance and identify reasons for remaining unbalanced. Patients will be encouraged to explore ways to practice healthy habits daily to attain better physical and mental health outcomes.  Therapeutic Goals: 1. Identify aspects of wellness that they are doing well. 2. Identify aspects of wellness that they would like to improve upon. 3. Identify one action they can take to improve an aspect of wellness in their lives.   Summary of Patient Progress: Did not attend.   Therapeutic Modalities: Cognitive Behavioral Therapy Solution-Focused Therapy Relapse Prevention   Chaunta Bejarano MSW, LCSW Clincal Social Worker  [facility]  

## 2020-08-19 NOTE — Progress Notes (Addendum)
[facility] MD Progress Note  08/20/2020 8:56 AM Susan English  MRN:  [id number]   Chief Complaint: psychosis  Subjective:  Susan English is a 30 y.o. female with a history of opiate use d/o on MAT, who was initially admitted for inpatient psychiatric hospitalization on 08/11/2020 for management of agitation, psychosis, and paranoia. Her UDS on admission was positive for THC. The patient is currently on Hospital Day 9.   Chart Review from last 24 hours:  The patient's chart was reviewed and nursing notes were reviewed. The patient's case was discussed in multidisciplinary team meeting. Per nursing, on day shift she had evidence of thought blocking, required verbal redirection from entering peer's room, and appeared preoccupied. On night shift she stayed in her room much of the evening. Per MAR she was compliant with scheduled medications, and received Vistaril X1 for anxiety and Trazodone X1 for sleep.    Information Obtained Today During Patient Interview: The patient was seen and evaluated on the unit. On assessment today the patient reports that she is feeling oversedated on her medications. She is unsure why she was originally admitted to the hospital but states that she is gradually feeling that she is "returning to my normal self." She states she does not recall the paranoia or agitation that she was experiencing prior to admission. She does admit to using Delta 8 and THC about a week prior to admission but is vague as to her pattern of THC use recently. She denies AVH, paranoia, ideas of reference, or first rank symptoms today. She denies SI or HI. She states her appetite is fair and her sleep overnight has been good. She voices no physical complaints. We discussed plans to check additional labs given the new onset of her recent psychosis. I advised that the hope is that as she clears the [facility] and synthetic drugs in her system that she will continue to have resolution of psychosis.   Principal Problem:  Schizophrenia spectrum disorder with psychotic disorder type not yet determined ([place]) Diagnosis: Principal Problem:   Schizophrenia spectrum disorder with psychotic disorder type not yet determined ([place]) Active Problems:   Marijuana abuse   Opiate use  Total Time Spent in Direct Patient Care:  I personally spent 35 minutes on the unit in direct patient care. The direct patient care time included face-to-face time with the patient, reviewing the patient's chart, communicating with other professionals, and coordinating care. Greater than 50% of this time was spent in counseling or coordinating care with the patient regarding goals of hospitalization, psycho-education, and discharge planning needs.  Past Psychiatric History: see admission H&P  Past Medical History:  Past Medical History:  Diagnosis Date  . Headache(784.0)   . 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 Hypertension   . Obesity     Past Surgical History:  Procedure Laterality Date  . CESAREAN SECTION    . ORIF ANKLE FRACTURE Right 12/22/2012   Procedure: OPEN REDUCTION INTERNAL FIXATION (ORIF) ANKLE FRACTURE;  Surgeon: Nita Sells, MD;  Location: [place];  Service: Orthopedics;  Laterality: Right;  . sweat gland     Family History:  Family History  Problem Relation Age of Onset  . Hypertension Mother   . Diabetes Mother   . Diabetes Maternal Grandmother   . Breast cancer Paternal Grandmother    Family Psychiatric  History: see admission H&P  Social History:  Social History   Substance and Sexual Activity  Alcohol Use No   Comment: occ     Social History   Substance and Sexual Activity  Drug Use Yes  . Types: Marijuana, Other-see comments   Comment: pt reports CBD use also    Social History   Socioeconomic History  . Marital status: Single    Spouse name: n/a  . Number of children: 2  . Years of education: Not on file  . Highest  education level: Associate degree: occupational, Hotel manager, or vocational program  Occupational History  . Not on file  Tobacco Use  . Smoking status: Current Every Day Smoker    Packs/day: 0.50    Types: Cigarettes  . Smokeless tobacco: Never Used  Vaping Use  . Vaping Use: Never used  Substance and Sexual Activity  . Alcohol use: No    Comment: occ  . Drug use: Yes    Types: Marijuana, Other-see comments    Comment: pt reports CBD use also  . Sexual activity: Yes    Birth control/protection: Implant  Other Topics Concern  . Not on file  Social History Narrative  . Not on file   Social Determinants of Health   Financial Resource Strain: Not on file  Food Insecurity: Not on file  Transportation Needs: Not on file  Physical Activity: Not on file  Stress: Not on file  Social Connections: Not on file   Sleep: Good  Appetite:  Fair  Current Medications: Current Facility-Administered Medications  Medication Dose Route Frequency Provider Last Rate Last Admin  . acetaminophen (TYLENOL) tablet 650 mg  650 mg Oral Q6H PRN Prescilla Sours, PA-C   650 mg at 08/19/20 0825  . alum & mag hydroxide-simeth (MAALOX/MYLANTA) 200-200-20 MG/5ML suspension 30 mL  30 mL Oral Q4H PRN Margorie John W, PA-C      . benztropine (COGENTIN) tablet 0.5 mg  0.5 mg Oral BID PRN Nelda Marseille, Zarie Kosiba E, MD      . buprenorphine-naloxone (SUBOXONE) 2-0.5 mg per SL tablet 1 tablet  1 tablet Sublingual TID WC & HS Sharma Covert, MD   1 tablet at 08/19/20 2211  . haloperidol (HALDOL) tablet 5 mg  5 mg Oral BID Sharma Covert, MD   5 mg at 08/19/20 1705   Or  . haloperidol lactate (HALDOL) injection 5 mg  5 mg Intramuscular BID Sharma Covert, MD      . hydrochlorothiazide (HYDRODIURIL) tablet 25 mg  25 mg Oral q morning Sharma Covert, MD   25 mg at 08/19/20 0930  . hydrOXYzine (ATARAX/VISTARIL) tablet 25 mg  25 mg Oral TID PRN Prescilla Sours, PA-C   25 mg at 08/19/20 2206  . LORazepam (ATIVAN)  injection 1 mg  1 mg Intravenous Q6H PRN Nelda Marseille, Berkley Cronkright E, MD      . ziprasidone (GEODON) injection 20 mg  20 mg Intramuscular Q12H PRN Harlow Asa, MD       And  . OLANZapine zydis (ZYPREXA) disintegrating tablet 10 mg  10 mg Oral Q8H PRN Harlow Asa, MD       And  . LORazepam (ATIVAN) tablet 1 mg  1 mg Oral Q6H PRN Nelda Marseille, Tyresa Prindiville E, MD      . magnesium hydroxide (MILK OF MAGNESIA) suspension 30 mL  30 mL Oral Daily PRN Margorie John W, PA-C      . potassium chloride SA (KLOR-CON) CR tablet 20 mEq  20 mEq Oral Once Harlow Asa, MD      . risperiDONE (RISPERDAL  M-TABS) disintegrating tablet 3 mg  3 mg Oral QAC breakfast Sharma Covert, MD      . risperiDONE (RISPERDAL M-TABS) disintegrating tablet 4 mg  4 mg Oral QHS Sharma Covert, MD   4 mg at 08/19/20 2206  . traZODone (DESYREL) tablet 50 mg  50 mg Oral QHS PRN Prescilla Sours, PA-C   50 mg at 08/19/20 2205    Lab Results:  Results for orders placed or performed during the hospital encounter of 08/11/20 (from the past 48 hour(s))  CBC with Differential/Platelet     Status: Abnormal   Collection Time: 08/19/20  6:59 AM  Result Value Ref Range   WBC 9.1 4.0 - 10.5 K/uL   RBC 4.69 3.87 - 5.11 MIL/uL   Hemoglobin 13.1 12.0 - 15.0 g/dL   HCT 40.7 36.0 - 46.0 %   MCV 86.8 80.0 - 100.0 fL   MCH 27.9 26.0 - 34.0 pg   MCHC 32.2 30.0 - 36.0 g/dL   RDW 13.4 11.5 - 15.5 %   Platelets 359 150 - 400 K/uL   nRBC 0.0 0.0 - 0.2 %   Neutrophils Relative % 45 %   Neutro Abs 4.2 1.7 - 7.7 K/uL   Lymphocytes Relative 35 %   Lymphs Abs 3.1 0.7 - 4.0 K/uL   Monocytes Relative 18 %   Monocytes Absolute 1.6 (H) 0.1 - 1.0 K/uL   Eosinophils Relative 1 %   Eosinophils Absolute 0.1 0.0 - 0.5 K/uL   Basophils Relative 1 %   Basophils Absolute 0.1 0.0 - 0.1 K/uL   Immature Granulocytes 0 %   Abs Immature Granulocytes 0.02 0.00 - 0.07 K/uL    Comment: Performed at [facility], [place] [address], [place], [place]  [address]  Comprehensive metabolic panel     Status: Abnormal   Collection Time: 08/19/20  6:59 AM  Result Value Ref Range   Sodium 134 (L) 135 - 145 mmol/L   Potassium 3.4 (L) 3.5 - 5.1 mmol/L   Chloride 96 (L) 98 - 111 mmol/L   CO2 27 22 - 32 mmol/L   Glucose, Bld 119 (H) 70 - 99 mg/dL    Comment: Glucose reference range applies only to samples taken after fasting for at least 8 hours.   BUN 18 6 - 20 mg/dL   Creatinine, Ser 0.77 0.44 - 1.00 mg/dL   Calcium 9.6 8.9 - 10.3 mg/dL   Total Protein 8.4 (H) 6.5 - 8.1 g/dL   Albumin 4.2 3.5 - 5.0 g/dL   AST 19 15 - 41 U/L   ALT 16 0 - 44 U/L   Alkaline Phosphatase 82 38 - 126 U/L   Total Bilirubin 1.2 0.3 - 1.2 mg/dL   GFR, Estimated >60 >60 mL/min    Comment: (NOTE) Calculated using the CKD-EPI Creatinine Equation (2021)    Anion gap 11 5 - 15    Comment: Performed at [facility], [place] [address] [place], [place] [address]  Hemoglobin A1c     Status: None   Collection Time: 08/19/20  6:59 AM  Result Value Ref Range   Hgb A1c MFr Bld 5.4 4.8 - 5.6 %    Comment: (NOTE) Pre diabetes:          5.7%-6.4%  Diabetes:              >6.4%  Glycemic control for   <7.0% adults with diabetes    Mean Plasma Glucose 108.28 mg/dL  Comment: Performed at [facility], [place] [address] [place], [place] [address]  Lipid panel     Status: Abnormal   Collection Time: 08/19/20  6:59 AM  Result Value Ref Range   Cholesterol 169 0 - 200 mg/dL   Triglycerides 62 <150 mg/dL   HDL 37 (L) >40 mg/dL   Total CHOL/HDL Ratio 4.6 RATIO   VLDL 12 0 - 40 mg/dL   LDL Cholesterol 120 (H) 0 - 99 mg/dL    Comment:        Total Cholesterol/HDL:CHD Risk Coronary Heart Disease Risk Table                     Men   Women  1/2 Average Risk   3.4   3.3  Average Risk       5.0   4.4  2 X Average Risk   9.6   7.1  3 X Average Risk  23.4   11.0        Use the calculated Patient Ratio above and the CHD Risk Table to determine the patient's  CHD Risk.        ATP III CLASSIFICATION (LDL):  <100     mg/dL   Optimal  100-129  mg/dL   Near or Above                    Optimal  130-159  mg/dL   Borderline  160-189  mg/dL   High  >190     mg/dL   Very High Performed at [facility] [address], [place], [place] [address]     Blood Alcohol level:  Lab Results  Component Value Date   ETH <10 76/72/0947    Metabolic Disorder Labs: Lab Results  Component Value Date   HGBA1C 5.4 08/19/2020   MPG 108.28 08/19/2020   No results found for: PROLACTIN Lab Results  Component Value Date   CHOL 169 08/19/2020   TRIG 62 08/19/2020   HDL 37 (L) 08/19/2020   CHOLHDL 4.6 08/19/2020   VLDL 12 08/19/2020   LDLCALC 120 (H) 08/19/2020    Physical Findings: AIMS: Facial and Oral Movements Muscles of Facial Expression: None, normal Lips and Perioral Area: None, normal Jaw: None, normal Tongue: None, normal,Extremity Movements Upper (arms, wrists, hands, fingers): None, normal Lower (legs, knees, ankles, toes): None, normal, Trunk Movements Neck, shoulders, hips: None, normal, Overall Severity Severity of abnormal movements (highest score from questions above): None, normal Incapacitation due to abnormal movements: None, normal Patient's awareness of abnormal movements (rate only patient's report): No Awareness, Dental Status Current problems with teeth and/or dentures?: No Does patient usually wear dentures?: No       Musculoskeletal: Strength & Muscle Tone: within normal limits Gait & Station: unassessed Patient leans: N/A  Psychiatric Specialty Exam: Physical Exam Vitals reviewed.  HENT:     Head: Normocephalic.  Pulmonary:     Effort: Pulmonary effort is normal.  Neurological:     Mental Status: She is alert.     Review of Systems  Respiratory: Negative for shortness of breath.   Cardiovascular: Negative for chest pain.  Gastrointestinal: Negative for diarrhea, nausea and vomiting.     Blood pressure 104/74, pulse 98, temperature 98 F (36.7 C), temperature source Oral, resp. rate 16, height $RemoveBe'5\' 8"'rOGonMEkd$  (1.727 m), weight 124.7 kg, SpO2 99 %, unknown if currently breastfeeding.Body mass index is 41.81 kg/m.  General Appearance: casually dressed, fair hygiene  Eye Contact:  Good  Speech:  Clear and Coherent and Normal Rate  Volume:  Normal  Mood:  Described as improving - appears calm  Affect:  Constricted  Thought Process:  Superficially goal directed  Orientation:  Oriented to self, year, month, and President  Thought Content:  Denies paranoia, AVH, ideas of reference, or first rank symptoms; does not appear to be grossly responding to internal/external stimuli on exam  Suicidal Thoughts:  No  Homicidal Thoughts:  No  Memory:  Recent;   Poor  Judgement:  Fair  Insight:  Lacking  Psychomotor Activity:  Normal, no cogwheeling, no stiffness, no tremor  Concentration:  Concentration: Fair  Recall: Poor  Fund of Knowledge:  Fair  Language:  Good  Akathisia:  Negative  AIMS (if indicated):   0  Assets:  Communication Skills Desire for Improvement Resilience  ADL's:  independent  Cognition:  WNL  Sleep:  Number of Hours: 8.75   Treatment Plan Summary: Diagnoses / Active Problems: Unspecified schizophrenia spectrum and other psychotic d/o (r/o substance induced psychotic d/o, r/o schizoaffective d/o, r/o schizophreniform d/o, r/o psychotic d/o secondary to a general medical condition) Opiate use d/o on MAT Cannabis use episodic- r/o cannabis use d/o  PLAN: 1. Safety and Monitoring:  -- Involuntary admission to inpatient psychiatric unit for safety, stabilization and treatment  -- Daily contact with patient to assess and evaluate symptoms and progress in treatment  -- Patient's case to be discussed in multi-disciplinary team meeting  -- Observation Level : q15 minute checks  -- Vital signs:  q12 hours  -- Precautions: suicide  2. Psychiatric Diagnoses and  Treatment:   Unspecified schizophrenia spectrum and other psychotic d/o (r/o substance induced psychotic d/o, r/o schizoaffective d/o, r/o schizophreniform d/o, r/o psychotic d/o secondary to a general medical condition) -- Reduce Risperdal M tabs 3mg  po bid due to c/o oversedation -- Reduce Haldol 2.5mg  po or IM bid per forced medication protocol with the hope of gradually discontinuing this medication over time as psychosis is resolving and secondary to c/o oversedation -- In context of new onset psychosis symptoms will proceed with additional medical w/u to r/o an organic etiology to her presentation (Noncontrast Head CT negative 08/10/20; WBC 9.1, TSH 2.090, ETOH <10, UDS positive for THC) RPR, HIV, ceruloplasmin, B12, CRP, ESR, ANA pending  -- Metabolic profile and EKG monitoring obtained while on an atypical antipsychotic (Lipid Panel:cholesterol 169, triglycerides 62, HDL 37, LDL 120; HbgA1c:5.4 QTc:47ms)   -- Add Cogentin 0.5mg  bid PRN tremor/EPS  -- Agitation protocol (Ativan 1mg  po or IM q6 hours PRN agitation, Zyprexa zydis 10mg  po q8 hours PRN agitation, and Geodon 20mg  IM q12 hours PRN agitation)  -- Continue Trazodone 50mg  po qhs PRN insomnia  -- Continue Vistaril 25mg  tid PRN anxiety  -- Encouraged patient to participate in unit milieu and in scheduled group therapies   -- Short Term Goals: Ability to identify changes in lifestyle to reduce recurrence of condition will improve  -- Long Term Goals: Improvement in symptoms so as ready for discharge   Opiate use d/o on MAT  Cannabis use episodic- r/o Cannabis use d/o  -- Continue suboxone 2/0.5mg  tid and qhs  -- Short Term Goals: Ability to identify triggers associated with substance abuse/mental health issues will improve  -- Long Term Goals: Improvement in symptoms so as ready for discharge   3. Medical Issues Being Addressed:   HTN  -- Continue HCTZ 25mg  daily   -- If hyponatremia and hypokalemia persist,  consider change to  different antihypertensive agent   Hyponatremia (Na+ 134) and Hypokalemia (K+ 3.4)  -- Encouraging po intake and giving Klor 49meq X1 today  -- Rechecking CMP tomorrow for trending  4. Discharge Planning:   -- Social work and case management to assist with discharge planning and identification of hospital follow-up needs prior to discharge  -- Estimated LOS: 5-7 days  -- Discharge Concerns: Need to establish a safety plan; Medication compliance and effectiveness  -- Discharge Goals: Return home with outpatient referrals for mental health follow-up including medication management/psychotherapy   Harlow Asa, MD, FAPA 08/20/2020, 8:56 AM

## 2020-08-19 NOTE — Progress Notes (Signed)
Pt presents with flat affect, slow but steady gait. Suspicious, thought blocking with blank stares and preoccupied on interactions "can you really tell me why I'm here, I don't believe that. I don't understand all this, there are lots of things going on my head right now". Pt ambulatory in milieu with a slow but steady gait.  Observed attempting to enter peers' rooms and required verbal redirections. Reports fair sleep last night with fair appetite, low energy and poor concentration level. Rates her depression 8/10 and anxiety 9/10 on self inventory sheet. Attended recreation group this shift. Received PRN Tylenol 650 mg PO at 0825 for complain of bilateral hip pain 7/10. Reported pain 0/10 when reassessed at 0920. Took her scheduled medications with increased prompts from staff. Verbal education done on discharge process related to pt demands "I just want to go home" without evidence of learning due to pt's confused state. Support and encouragement offered. Q 15 minutes safety checks maintained without self harm gestures.

## 2020-08-19 NOTE — [facility] Group Notes (Signed)
[facility] Group Notes:  (Nursing/MHT/Case Management/Adjunct)  Date:  08/19/2020  Time:  2:17 PM  Type of Therapy:  Group Therapy  Participation Level:  Minimal  Participation Quality:  Resistant  Affect:  Flat  Cognitive:  Confused  Insight:  Lacking  Engagement in Group:  Lacking and Limited  Modes of Intervention:  Orientation  Summary of Progress/Problems: Pt's goal is to feel better.   Jayesh Marbach J Moyses Pavey 08/19/2020, 2:17 PM

## 2020-08-19 NOTE — Progress Notes (Signed)
[facility] MD Progress Note  08/19/2020 12:22 PM Susan English  MRN:  [id number] Subjective:  Patient is a 30 year old female who originally presented to the [facility] emergency department on 08/04/2020 with disorganization, rambling speech and tangential thoughts.  Objective: Patient is seen and examined.  Patient is a 30 year old female with the above-stated past psychiatric history who is seen in follow-up.  I made some changes to her medicines yesterday, and she got the medications in her system finally.  This morning she is sedated, but able to converse more appropriately.  None of the paranoid staring that was present yesterday again.  She denied auditory or visual hallucinations.  She did sleep well last night.  Review of nursing notes from last night revealed that she had been isolating to her room, but would come up and take her medications.  She was noted to be talking a little bit more than the night before but still had some degree of thought blocking.  Laboratories from this a.m. showed normal electrolytes including creatinine at 0.77.  Liver function enzymes are normal.  Lipid panel was normal.  CBC was normal.  Differential was normal.  Hemoglobin A1c was 5.4.  EKG from 4/10 showed a sinus arrhythmia with a normal QTc interval.  Her most recent blood pressure was 114/81.  Again, she slept 6.75 hours last night.  Principal Problem: Substance-induced psychotic disorder with delusions (HCC) Diagnosis: Principal Problem:   Substance-induced psychotic disorder with delusions (HCC)  Total Time spent with patient: 20 minutes  Past Psychiatric History: See admission H&P  Past Medical History:  Past Medical History:  Diagnosis Date  . Headache(784.0)   . 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 Hypertension   . Obesity     Past Surgical History:  Procedure Laterality Date  . CESAREAN SECTION    . ORIF ANKLE FRACTURE Right 12/22/2012    Procedure: OPEN REDUCTION INTERNAL FIXATION (ORIF) ANKLE FRACTURE;  Surgeon: Mable Paris, MD;  Location: [facility];  Service: Orthopedics;  Laterality: Right;  . sweat gland     Family History:  Family History  Problem Relation Age of Onset  . Hypertension Mother   . Diabetes Mother   . Diabetes Maternal Grandmother   . Breast cancer Paternal Grandmother    Family Psychiatric  History: See admission H&P Social History:  Social History   Substance and Sexual Activity  Alcohol Use No   Comment: occ     Social History   Substance and Sexual Activity  Drug Use Yes  . Types: Marijuana, Other-see comments   Comment: pt reports CBD use also    Social History   Socioeconomic History  . Marital status: Single    Spouse name: n/a  . Number of children: 2  . Years of education: Not on file  . Highest education level: Associate degree: occupational, Scientist, product/process development, or vocational program  Occupational History  . Not on file  Tobacco Use  . Smoking status: Current Every Day Smoker    Packs/day: 0.50    Types: Cigarettes  . Smokeless tobacco: Never Used  Vaping Use  . Vaping Use: Never used  Substance and Sexual Activity  . Alcohol use: No    Comment: occ  . Drug use: Yes    Types: Marijuana, Other-see comments    Comment: pt reports CBD use also  . Sexual activity: Yes    Birth control/protection: Implant  Other Topics Concern  .  Not on file  Social History Narrative  . Not on file   Social Determinants of Health   Financial Resource Strain: Not on file  Food Insecurity: Not on file  Transportation Needs: Not on file  Physical Activity: Not on file  Stress: Not on file  Social Connections: Not on file   Additional Social History:                         Sleep: Good  Appetite:  Fair  Current Medications: Current Facility-Administered Medications  Medication Dose Route Frequency Provider Last Rate Last Admin  . acetaminophen  (TYLENOL) tablet 650 mg  650 mg Oral Q6H PRN Jaclyn Shaggy, PA-C   650 mg at 08/19/20 0825  . alum & mag hydroxide-simeth (MAALOX/MYLANTA) 200-200-20 MG/5ML suspension 30 mL  30 mL Oral Q4H PRN Ladona Ridgel, Cody W, PA-C      . buprenorphine-naloxone (SUBOXONE) 2-0.5 mg per SL tablet 1 tablet  1 tablet Sublingual TID WC & HS Antonieta Pert, MD   1 tablet at 08/19/20 0930  . haloperidol (HALDOL) tablet 10 mg  10 mg Oral Daily Antonieta Pert, MD   10 mg at 08/19/20 1696   Or  . haloperidol lactate (HALDOL) injection 10 mg  10 mg Intramuscular Daily Antonieta Pert, MD      . haloperidol (HALDOL) tablet 10 mg  10 mg Oral QHS Antonieta Pert, MD   10 mg at 08/18/20 2102   Or  . haloperidol lactate (HALDOL) injection 10 mg  10 mg Intramuscular QHS Antonieta Pert, MD      . hydrochlorothiazide (HYDRODIURIL) tablet 25 mg  25 mg Oral q morning Antonieta Pert, MD   25 mg at 08/19/20 0930  . hydrOXYzine (ATARAX/VISTARIL) tablet 25 mg  25 mg Oral TID PRN Jaclyn Shaggy, PA-C   25 mg at 08/18/20 2102  . OLANZapine zydis (ZYPREXA) disintegrating tablet 10 mg  10 mg Oral Q8H PRN Antonieta Pert, MD       And  . LORazepam (ATIVAN) tablet 1 mg  1 mg Oral Q6H PRN Antonieta Pert, MD       And  . ziprasidone (GEODON) injection 20 mg  20 mg Intramuscular Q6H PRN Antonieta Pert, MD      . magnesium hydroxide (MILK OF MAGNESIA) suspension 30 mL  30 mL Oral Daily PRN Melbourne Abts W, PA-C      . risperiDONE (RISPERDAL M-TABS) disintegrating tablet 3 mg  3 mg Oral Daily Antonieta Pert, MD   3 mg at 08/19/20 0824  . risperiDONE (RISPERDAL M-TABS) disintegrating tablet 4 mg  4 mg Oral QHS Antonieta Pert, MD   4 mg at 08/18/20 2102  . traZODone (DESYREL) tablet 50 mg  50 mg Oral QHS PRN Jaclyn Shaggy, PA-C   50 mg at 08/18/20 2102    Lab Results:  Results for orders placed or performed during the hospital encounter of 08/11/20 (from the past 48 hour(s))  CBC with  Differential/Platelet     Status: Abnormal   Collection Time: 08/19/20  6:59 AM  Result Value Ref Range   WBC 9.1 4.0 - 10.5 K/uL   RBC 4.69 3.87 - 5.11 MIL/uL   Hemoglobin 13.1 12.0 - 15.0 g/dL   HCT 78.9 38.1 - 01.7 %   MCV 86.8 80.0 - 100.0 fL   MCH 27.9 26.0 - 34.0 pg   MCHC 32.2 30.0 - 36.0  g/dL   RDW 16.113.4 09.611.5 - 04.515.5 %   Platelets 359 150 - 400 K/uL   nRBC 0.0 0.0 - 0.2 %   Neutrophils Relative % 45 %   Neutro Abs 4.2 1.7 - 7.7 K/uL   Lymphocytes Relative 35 %   Lymphs Abs 3.1 0.7 - 4.0 K/uL   Monocytes Relative 18 %   Monocytes Absolute 1.6 (H) 0.1 - 1.0 K/uL   Eosinophils Relative 1 %   Eosinophils Absolute 0.1 0.0 - 0.5 K/uL   Basophils Relative 1 %   Basophils Absolute 0.1 0.0 - 0.1 K/uL   Immature Granulocytes 0 %   Abs Immature Granulocytes 0.02 0.00 - 0.07 K/uL    Comment: Performed at [facility], [address] [place], [place] [address]  Comprehensive metabolic panel     Status: Abnormal   Collection Time: 08/19/20  6:59 AM  Result Value Ref Range   Sodium 134 (L) 135 - 145 mmol/L   Potassium 3.4 (L) 3.5 - 5.1 mmol/L   Chloride 96 (L) 98 - 111 mmol/L   CO2 27 22 - 32 mmol/L   Glucose, Bld 119 (H) 70 - 99 mg/dL    Comment: Glucose reference range applies only to samples taken after fasting for at least 8 hours.   BUN 18 6 - 20 mg/dL   Creatinine, Ser 1.910.77 0.44 - 1.00 mg/dL   Calcium 9.6 8.9 - 47.810.3 mg/dL   Total Protein 8.4 (H) 6.5 - 8.1 g/dL   Albumin 4.2 3.5 - 5.0 g/dL   AST 19 15 - 41 U/L   ALT 16 0 - 44 U/L   Alkaline Phosphatase 82 38 - 126 U/L   Total Bilirubin 1.2 0.3 - 1.2 mg/dL   GFR, Estimated >29>60 >56>60 mL/min    Comment: (NOTE) Calculated using the CKD-EPI Creatinine Equation (2021)    Anion gap 11 5 - 15    Comment: Performed at [facility], [address] [place], [place] [address]  Hemoglobin A1c     Status: None   Collection Time: 08/19/20  6:59 AM  Result Value Ref Range   Hgb A1c MFr Bld 5.4  4.8 - 5.6 %    Comment: (NOTE) Pre diabetes:          5.7%-6.4%  Diabetes:              >6.4%  Glycemic control for   <7.0% adults with diabetes    Mean Plasma Glucose 108.28 mg/dL    Comment: Performed at [facility], [address] [place], [place] [address]  Lipid panel     Status: Abnormal   Collection Time: 08/19/20  6:59 AM  Result Value Ref Range   Cholesterol 169 0 - 200 mg/dL   Triglycerides 62 <696<150 mg/dL   HDL 37 (L) >29>40 mg/dL   Total CHOL/HDL Ratio 4.6 RATIO   VLDL 12 0 - 40 mg/dL   LDL Cholesterol 528120 (H) 0 - 99 mg/dL    Comment:        Total Cholesterol/HDL:CHD Risk Coronary Heart Disease Risk Table                     Men   Women  1/2 Average Risk   3.4   3.3  Average Risk       5.0   4.4  2 X Average Risk   9.6   7.1  3 X Average Risk  23.4  11.0        Use the calculated Patient Ratio above and the CHD Risk Table to determine the patient's CHD Risk.        ATP III CLASSIFICATION (LDL):  <100     mg/dL   Optimal  941-740  mg/dL   Near or Above                    Optimal  130-159  mg/dL   Borderline  814-481  mg/dL   High  >856     mg/dL   Very High Performed at [facility], [address], [place], [place] [address]     Blood Alcohol level:  Lab Results  Component Value Date   ETH <10 08/08/2020    Metabolic Disorder Labs: Lab Results  Component Value Date   HGBA1C 5.4 08/19/2020   MPG 108.28 08/19/2020   No results found for: PROLACTIN Lab Results  Component Value Date   CHOL 169 08/19/2020   TRIG 62 08/19/2020   HDL 37 (L) 08/19/2020   CHOLHDL 4.6 08/19/2020   VLDL 12 08/19/2020   LDLCALC 120 (H) 08/19/2020    Physical Findings: AIMS: Facial and Oral Movements Muscles of Facial Expression: None, normal Lips and Perioral Area: None, normal Jaw: None, normal Tongue: None, normal,Extremity Movements Upper (arms, wrists, hands, fingers): None, normal Lower (legs, knees, ankles, toes): None, normal,  Trunk Movements Neck, shoulders, hips: None, normal, Overall Severity Severity of abnormal movements (highest score from questions above): None, normal Incapacitation due to abnormal movements: None, normal Patient's awareness of abnormal movements (rate only patient's report): No Awareness, Dental Status Current problems with teeth and/or dentures?: No Does patient usually wear dentures?: No  CIWA:    COWS:     Musculoskeletal: Strength & Muscle Tone: within normal limits Gait & Station: shuffle Patient leans: N/A  Psychiatric Specialty Exam:  Presentation  General Appearance: Disheveled  Eye Contact:Fair  Speech:Slow  Speech Volume:Normal  Handedness:Right   Mood and Affect  Mood:Dysphoric  Affect:Flat   Thought Process  Thought Processes:Goal Directed  Descriptions of Associations:Loose  Orientation:Partial  Thought Content:Delusions; Paranoid Ideation  History of Schizophrenia/Schizoaffective disorder:No  Duration of Psychotic Symptoms:Less than six months  Hallucinations:Hallucinations: Auditory  Ideas of Reference:Delusions; Paranoia  Suicidal Thoughts:Suicidal Thoughts: No  Homicidal Thoughts:Homicidal Thoughts: No   Sensorium  Memory:Immediate Poor; Recent Poor; Remote Poor  Judgment:Fair  Insight:Fair   Executive Functions  Concentration:Fair  Attention Span:Fair  Recall:Fair  Fund of Knowledge:Fair  Language:Fair   Psychomotor Activity  Psychomotor Activity:Psychomotor Activity: Decreased   Assets  Assets:Desire for Improvement; Resilience; Social Support; Housing   Sleep  Sleep:Sleep: Good Number of Hours of Sleep: 6.75    Physical Exam: Physical Exam Vitals and nursing note reviewed.  HENT:     Head: Normocephalic and atraumatic.  Pulmonary:     Effort: Pulmonary effort is normal.  Neurological:     General: No focal deficit present.     Mental Status: She is alert and oriented to person, place, and time.     ROS Blood pressure 114/81, pulse (!) 112, temperature 98.4 F (36.9 C), temperature source Oral, resp. rate 20, height 5\' 8"  (1.727 m), weight 124.7 kg, SpO2 100 %, unknown if currently breastfeeding. Body mass index is 41.81 kg/m.   Treatment Plan Summary: Daily contact with patient to assess and evaluate symptoms and progress in treatment, Medication management and Plan : Patient is seen and examined.  Patient is a 30 year old female  with the above-stated past psychiatric history who is seen in follow-up.   Diagnosis: 1.  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  The longer this goes on she appears to be more schizophrenic. 2.  Cannabis use disorder 3.  Tachycardia  Pertinent findings on examination today: 1.  Patient appears to be less paranoid and less guarded. 2.  Able to speak in a more appropriate manner. 3.  Sleep continues to do well. 4.  EKG revealed sinus arrhythmia with no evidence of tachycardia related to psychiatric medications. 5.  Metabolic panel as well as CBC and differential, hemoglobin A1c and lipid panel are all within normal limits.  Plan: 1.  Reduce haloperidol to 5 mg p.o. or IM twice daily for psychosis. 2.  Decrease buprenorphine to 1 tablet p.o. twice daily for opiate dependence. 3.  Continue hydrochlorothiazide 25 mg p.o. daily for hypertension. 4.  Continue hydroxyzine 25 mg p.o. 3 times daily as needed anxiety. 5.  Continue Risperdal 3 mg p.o. daily and 4 mg p.o. nightly for psychosis. 6.  Continue trazodone 50 mg p.o. nightly as needed insomnia. 7.  Disposition planning-in progress.  Antonieta Pert, MD 08/19/2020, 12:22 PM

## 2020-08-19 NOTE — Progress Notes (Signed)
Adult Psychoeducational Group Note  Date:  08/19/2020 Time:  10:41 PM  Group Topic/Focus:  Wrap-Up Group:   The focus of this group is to help patients review their daily goal of treatment and discuss progress on daily workbooks.  Participation Level:  Did Not Attend  Participation Quality:  Did Not Attend  Affect:  Did Not Attend  Cognitive:  Did Not Attend  Insight: None  Engagement in Group:  Did Not Attend  Modes of Intervention:  Did Not Attend  Additional Comments:  Pt did not attend evening wrap up group.  Felipa Furnace 08/19/2020, 10:41 PM

## 2020-08-20 DIAGNOSIS — F1210 Cannabis abuse, uncomplicated: Secondary | ICD-10-CM

## 2020-08-20 DIAGNOSIS — F29 Unspecified psychosis not due to a substance or known physiological condition: Principal | ICD-10-CM

## 2020-08-20 DIAGNOSIS — F1190 Opioid use, unspecified, uncomplicated: Secondary | ICD-10-CM | POA: Diagnosis present

## 2020-08-20 HISTORY — DX: Cannabis abuse, uncomplicated: F12.10

## 2020-08-20 MED ORDER — OLANZAPINE 10 MG PO TBDP: 10.0000 mg | ORAL_TABLET | Freq: Three times a day (TID) | ORAL | Status: DC | PRN

## 2020-08-20 MED ORDER — LORAZEPAM 2 MG/ML IJ SOLN: 1.0000 mg | Freq: Four times a day (QID) | INTRAMUSCULAR | Status: DC | PRN

## 2020-08-20 MED ORDER — HALOPERIDOL 5 MG PO TABS
2.5000 mg | ORAL_TABLET | Freq: Two times a day (BID) | ORAL | Status: DC
  Administered 2020-08-20 – 2020-08-24 (×8): 2.5 mg via ORAL
  Filled 2020-08-20 (×12): qty 0.5

## 2020-08-20 MED ORDER — BENZTROPINE MESYLATE 0.5 MG PO TABS: 0.5000 mg | ORAL_TABLET | Freq: Two times a day (BID) | ORAL | Status: DC | PRN

## 2020-08-20 MED ORDER — HALOPERIDOL LACTATE 5 MG/ML IJ SOLN
2.5000 mg | Freq: Two times a day (BID) | INTRAMUSCULAR | Status: DC
  Filled 2020-08-20 (×12): qty 0.5

## 2020-08-20 MED ORDER — RISPERIDONE 3 MG PO TBDP
3.0000 mg | ORAL_TABLET | Freq: Every day | ORAL | Status: DC
  Administered 2020-08-20 – 2020-08-21 (×2): 3 mg via ORAL
  Filled 2020-08-20 (×4): qty 1

## 2020-08-20 MED ORDER — NICOTINE POLACRILEX 2 MG MT GUM
CHEWING_GUM | OROMUCOSAL | Status: AC
  Filled 2020-08-20: qty 1

## 2020-08-20 MED ORDER — NICOTINE POLACRILEX 2 MG MT GUM
2.0000 mg | CHEWING_GUM | OROMUCOSAL | Status: DC | PRN
  Administered 2020-08-20 – 2020-08-24 (×4): 2 mg via ORAL

## 2020-08-20 MED ORDER — ZIPRASIDONE MESYLATE 20 MG IM SOLR: 20.0000 mg | Freq: Two times a day (BID) | INTRAMUSCULAR | Status: DC | PRN

## 2020-08-20 MED ORDER — LORAZEPAM 1 MG PO TABS: 1.0000 mg | ORAL_TABLET | Freq: Four times a day (QID) | ORAL | Status: AC | PRN

## 2020-08-20 MED ORDER — POTASSIUM CHLORIDE CRYS ER 20 MEQ PO TBCR
20.0000 meq | EXTENDED_RELEASE_TABLET | Freq: Once | ORAL | Status: AC
  Administered 2020-08-20: 20 meq via ORAL
  Filled 2020-08-20: qty 1

## 2020-08-20 NOTE — Progress Notes (Signed)
Pt signed HIV consent

## 2020-08-20 NOTE — Progress Notes (Signed)
Progress note    08/20/20 1000  Psych Admission Type (Psych Patients Only)  Admission Status Involuntary  Psychosocial Assessment  Patient Complaints Anxiety  Eye Contact Intense;Watchful  Facial Expression Blank;Flat;Pensive  Affect Anxious;Preoccupied  Speech Soft  Interaction Cautious;Forwards little;Guarded  Motor Activity Fidgety  Appearance/Hygiene Improved  Behavior Characteristics Cooperative;Appropriate to situation;Anxious;Guarded  Mood Anxious;Suspicious;Preoccupied;Pleasant  Thought Process  Coherency Blocking  Content Paranoia  Delusions Paranoid  Perception Hallucinations  Hallucination Auditory  Judgment Poor  Confusion None  Danger to Self  Current suicidal ideation? Denies  Danger to Others  Danger to Others None reported or observed

## 2020-08-20 NOTE — Progress Notes (Signed)
Pt did not attend orientation group.  

## 2020-08-20 NOTE — Progress Notes (Signed)
Adult Psychoeducational Group Note  Date:  08/20/2020 Time:  11:02 PM  Group Topic/Focus:  Wrap-Up Group:   The focus of this group is to help patients review their daily goal of treatment and discuss progress on daily workbooks.  Participation Level:  Did Not Attend  Participation Quality:  Did Not Attend  Affect:  Did Not Attend  Cognitive:  Did Not Attend  Insight: None  Engagement in Group:  Did Not Attend  Modes of Intervention:  Did Not Attend  Additional Comments:  Pt did not attend evening wrap up group tonight.  Felipa Furnace 08/20/2020, 11:02 PM

## 2020-08-20 NOTE — Plan of Care (Signed)
  Problem: Nutrition: Goal: Adequate nutrition will be maintained Outcome: Progressing   Problem: Coping: Goal: Level of anxiety will decrease Outcome: Progressing   

## 2020-08-20 NOTE — Progress Notes (Signed)
Recreation Therapy Notes  Date: 4.12.22 Time: 0950-1025 Location: [address] Dayroom  Group Topic: Wellness  Goal Area(s) Addresses:  Patient will define components of whole wellness. Patient will verbalize benefit of whole wellness.  Intervention: Worksheet and Exercise   Activity: LRT and patients discussed the importance of being physically active and how exercise can have a positive effect on mental health.  Patients would then take turns leading the group in exercises of their choosing.  Patients were encouraged to do the best they could without staining themselves.  Patients could take breaks and get water as needed.  Education: Wellness, Discharge Planning.   Education Outcome: Acknowledges education/In group clarification offered/Needs additional education.   Clinical Observations/Feedback: Pt did not attend group session.    Marleny Faller, LRT/CTRS         Lawanna Cecere A 08/20/2020 10:55 AM 

## 2020-08-20 NOTE — Progress Notes (Signed)
   08/20/20 0500  Sleep  Number of Hours 8.75

## 2020-08-21 MED ORDER — LORAZEPAM 1 MG PO TABS
1.0000 mg | ORAL_TABLET | Freq: Four times a day (QID) | ORAL | Status: DC | PRN
  Administered 2020-08-24 (×2): 1 mg via ORAL
  Filled 2020-08-21 (×2): qty 1

## 2020-08-21 NOTE — Progress Notes (Signed)
D: Patient denies SI/HI/AVH, is isolative to her room most of the time unless called up for medications. Pt appears guarded, is paranoid, pulls away, stating that she does not like being watched while taking her medications. Pt reports her sleep quality last night as fair, reports a fair appetite, reports a low energy level, and reports a good concentration level today. Pt rates her depression today as 5 (10 being the worst), rates her hopelessness level as 3 (10 being the worst), and anxiety as 8 (10 being the worst). Pt unable to state what goal she would like to work on today.  A: Patient is being maintained on Q15 minute safety checks and is taking all of her meds as ordered.  R:Pt will continue to be maintained on Q15 minute checks for safety   08/21/20 1241  Psych Admission Type (Psych Patients Only)  Admission Status Involuntary  Psychosocial Assessment  Patient Complaints None  Eye Contact Suspiciousness  Facial Expression Flat  Affect Blunted  Speech Soft  Interaction Cautious;Guarded;Isolative  Motor Activity Fidgety  Appearance/Hygiene Improved  Behavior Characteristics Cooperative  Mood Suspicious  Thought Process  Coherency WDL  Content Paranoia  Delusions Paranoid  Perception WDL  Hallucination None reported or observed  Judgment Poor  Confusion None  Danger to Self  Current suicidal ideation? Denies  Danger to Others  Danger to Others None reported or observed

## 2020-08-21 NOTE — Progress Notes (Signed)
   08/20/20 2100  Psych Admission Type (Psych Patients Only)  Admission Status Involuntary  Psychosocial Assessment  Patient Complaints Anxiety  Eye Contact Intense;Watchful  Facial Expression Flat;Pensive  Affect Anxious;Preoccupied  Speech Soft  Interaction Cautious;Forwards little;Guarded;Isolative  Surveyor, quantity  Appearance/Hygiene Improved  Behavior Characteristics Cooperative;Anxious;Guarded  Mood Anxious;Suspicious;Preoccupied;Pleasant  Thought Process  Coherency Blocking  Content Paranoia  Delusions Paranoid  Perception Hallucinations  Hallucination None reported or observed  Judgment Poor  Confusion None  Danger to Self  Current suicidal ideation? Denies  Danger to Others  Danger to Others None reported or observed

## 2020-08-21 NOTE — Progress Notes (Signed)
[facility] MD Progress Note  08/21/2020 8:17 AM Susan English  MRN:  [id number]   Chief Complaint: psychosis  Subjective:  Susan English is a 30 y.o. female with a history of opiate use d/o on MAT, who was initially admitted for inpatient psychiatric hospitalization on 08/11/2020 for management of agitation, psychosis, and paranoia. Her UDS on admission was positive for THC. The patient is currently on Hospital Day 10.   Chart Review from last 24 hours:  The patient's chart was reviewed and nursing notes were reviewed. The patient's case was discussed in multidisciplinary team meeting. Per nursing, patient did not attend groups and was isolative most of the day. No behavioral issues noted. Per MAR, she was compliant with scheduled medications except for 1 dose of suboxone. She received Vistaril X1 for anxiety and Trazodone X1 for sleep.  Information Obtained Today During Patient Interview: The patient was seen and evaluated on the unit. On interview she states her mood is "good" and she is "just chillin' today." She denies feeling oversedated today and states she has been out of her room some this morning. She reports that she "stays to herself" because she is "not a social person." She was encouraged to go to group and interact more in the milieu. She denies AVH, paranoia, first rank symptoms, or ideas of reference. She does not recall her recent behaviors or psychosis during admission and states she is "feeling more like herself." She denies feeling paranoid and states she has been in contact with her family. When questioned again about her drug use, she admits to mixing Delta 8 and THC prior to admission but remains vague about her pattern of use. She states that her Suboxone clinic has tolerated her THC use because they take clients from [place] where [facility] is legal. She also states that since she has been using Delta 8 her UDS have recently been negative at her Suboxone clinic. I discussed that her Suboxone  dose has been lowered during admission and she denies current cravings for opiates or signs of withdrawal. She denies any physical complaints. She reports good appetite and sleep and denies SI or HI.   Principal Problem: Schizophrenia spectrum disorder with psychotic disorder type not yet determined ([place]) Diagnosis: Principal Problem:   Schizophrenia spectrum disorder with psychotic disorder type not yet determined ([place]) Active Problems:   Marijuana abuse   Opiate use  Total Time Spent in Direct Patient Care:  I personally spent 30 minutes on the unit in direct patient care. The direct patient care time included face-to-face time with the patient, reviewing the patient's chart, communicating with other professionals, and coordinating care. Greater than 50% of this time was spent in counseling or coordinating care with the patient regarding goals of hospitalization, psycho-education, and discharge planning needs.  Past Psychiatric History: see admission H&P  Past Medical History:  Past Medical History:  Diagnosis Date  . Headache(784.0)   . 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 Hypertension   . Obesity     Past Surgical History:  Procedure Laterality Date  . CESAREAN SECTION    . ORIF ANKLE FRACTURE Right 12/22/2012   Procedure: OPEN REDUCTION INTERNAL FIXATION (ORIF) ANKLE FRACTURE;  Surgeon: Nita Sells, MD;  Location: [place];  Service: Orthopedics;  Laterality: Right;  . sweat gland     Family History:  Family History  Problem Relation Age of Onset  . Hypertension Mother   . Diabetes  Mother   . Diabetes Maternal Grandmother   . Breast cancer Paternal Grandmother    Family Psychiatric  History: see admission H&P  Social History:  Social History   Substance and Sexual Activity  Alcohol Use No   Comment: occ     Social History   Substance and Sexual Activity  Drug Use Yes  . Types: Marijuana,  Other-see comments   Comment: pt reports CBD use also    Social History   Socioeconomic History  . Marital status: Single    Spouse name: n/a  . Number of children: 2  . Years of education: Not on file  . Highest education level: Associate degree: occupational, Hotel manager, or vocational program  Occupational History  . Not on file  Tobacco Use  . Smoking status: Current Every Day Smoker    Packs/day: 0.50    Types: Cigarettes  . Smokeless tobacco: Never Used  Vaping Use  . Vaping Use: Never used  Substance and Sexual Activity  . Alcohol use: No    Comment: occ  . Drug use: Yes    Types: Marijuana, Other-see comments    Comment: pt reports CBD use also  . Sexual activity: Yes    Birth control/protection: Implant  Other Topics Concern  . Not on file  Social History Narrative  . Not on file   Social Determinants of Health   Financial Resource Strain: Not on file  Food Insecurity: Not on file  Transportation Needs: Not on file  Physical Activity: Not on file  Stress: Not on file  Social Connections: Not on file   Sleep: Good  Appetite:  Good  Current Medications: Current Facility-Administered Medications  Medication Dose Route Frequency Provider Last Rate Last Admin  . acetaminophen (TYLENOL) tablet 650 mg  650 mg Oral Q6H PRN Prescilla Sours, PA-C   650 mg at 08/20/20 1007  . alum & mag hydroxide-simeth (MAALOX/MYLANTA) 200-200-20 MG/5ML suspension 30 mL  30 mL Oral Q4H PRN Margorie John W, PA-C      . benztropine (COGENTIN) tablet 0.5 mg  0.5 mg Oral BID PRN Nelda Marseille, Hersel Mcmeen E, MD      . buprenorphine-naloxone (SUBOXONE) 2-0.5 mg per SL tablet 1 tablet  1 tablet Sublingual TID WC & HS Sharma Covert, MD   1 tablet at 08/20/20 2047  . haloperidol (HALDOL) tablet 2.5 mg  2.5 mg Oral BID Nelda Marseille, Theia Dezeeuw E, MD   2.5 mg at 08/20/20 1754   Or  . haloperidol lactate (HALDOL) injection 2.5 mg  2.5 mg Intramuscular BID Nelda Marseille, Sondos Wolfman E, MD      . hydrochlorothiazide  (HYDRODIURIL) tablet 25 mg  25 mg Oral q morning Sharma Covert, MD   25 mg at 08/20/20 1007  . hydrOXYzine (ATARAX/VISTARIL) tablet 25 mg  25 mg Oral TID PRN Prescilla Sours, PA-C   25 mg at 08/20/20 2048  . LORazepam (ATIVAN) injection 1 mg  1 mg Intramuscular Q6H PRN Nelda Marseille, Gyselle Matthew E, MD      . ziprasidone (GEODON) injection 20 mg  20 mg Intramuscular Q12H PRN Harlow Asa, MD       And  . OLANZapine zydis (ZYPREXA) disintegrating tablet 10 mg  10 mg Oral Q8H PRN Harlow Asa, MD       And  . LORazepam (ATIVAN) tablet 1 mg  1 mg Oral Q6H PRN Nelda Marseille, Daelan Gatt E, MD      . magnesium hydroxide (MILK OF MAGNESIA) suspension 30 mL  30 mL Oral  Daily PRN Prescilla Sours, PA-C      . nicotine polacrilex (NICORETTE) gum 2 mg  2 mg Oral PRN Nwoko, Uchenna E, PA   2 mg at 08/20/20 2201  . risperiDONE (RISPERDAL M-TABS) disintegrating tablet 3 mg  3 mg Oral QAC breakfast Sharma Covert, MD   3 mg at 08/20/20 1008  . risperiDONE (RISPERDAL M-TABS) disintegrating tablet 3 mg  3 mg Oral QHS Harlow Asa, MD   3 mg at 08/20/20 2048  . traZODone (DESYREL) tablet 50 mg  50 mg Oral QHS PRN Prescilla Sours, PA-C   50 mg at 08/20/20 2047    Lab Results:  No results found for this or any previous visit (from the past 48 hour(s)).  Blood Alcohol level:  Lab Results  Component Value Date   ETH <10 23/53/6144    Metabolic Disorder Labs: Lab Results  Component Value Date   HGBA1C 5.4 08/19/2020   MPG 108.28 08/19/2020   No results found for: PROLACTIN Lab Results  Component Value Date   CHOL 169 08/19/2020   TRIG 62 08/19/2020   HDL 37 (L) 08/19/2020   CHOLHDL 4.6 08/19/2020   VLDL 12 08/19/2020   LDLCALC 120 (H) 08/19/2020    Physical Findings: AIMS: Facial and Oral Movements Muscles of Facial Expression: None, normal Lips and Perioral Area: None, normal Jaw: None, normal Tongue: None, normal,Extremity Movements Upper (arms, wrists, hands, fingers): None, normal Lower (legs,  knees, ankles, toes): None, normal, Trunk Movements Neck, shoulders, hips: None, normal, Overall Severity Severity of abnormal movements (highest score from questions above): None, normal Incapacitation due to abnormal movements: None, normal Patient's awareness of abnormal movements (rate only patient's report): No Awareness, Dental Status Current problems with teeth and/or dentures?: No Does patient usually wear dentures?: No       Musculoskeletal: Strength & Muscle Tone: within normal limits Gait & Station: steady, normal Patient leans: N/A  Psychiatric Specialty Exam: Physical Exam Vitals reviewed.  HENT:     Head: Normocephalic.  Pulmonary:     Effort: Pulmonary effort is normal.  Neurological:     Mental Status: She is alert.     Review of Systems  Respiratory: Negative for shortness of breath.   Cardiovascular: Negative for chest pain.  Gastrointestinal: Negative for diarrhea, nausea and vomiting.    Blood pressure 104/74, pulse 98, temperature 98 F (36.7 C), temperature source Oral, resp. rate 16, height $RemoveBe'5\' 8"'AjojvdcPJ$  (1.727 m), weight 124.7 kg, SpO2 99 %, unknown if currently breastfeeding.Body mass index is 41.81 kg/m.  General Appearance: casually dressed, fair hygiene  Eye Contact:  Good  Speech:  Clear and Coherent and Normal Rate  Volume:  Normal  Mood:  Described as "good" - appears calm  Affect:  Constricted, pleasant, mildly guarded  Thought Process:  Superficially goal directed  Orientation:  Oriented to self, year, month, and city  Thought Content:  Denies paranoia, AVH, ideas of reference, or first rank symptoms; does not appear to be grossly responding to internal/external stimuli on exam; mildly guarded on exam  Suicidal Thoughts:  No  Homicidal Thoughts:  No  Memory:  Recent;   Poor  Judgement:  Fair  Insight:  Lacking  Psychomotor Activity:  Normal  Concentration:  Concentration: Fair  Recall: Poor  Fund of Knowledge:  Fair  Language:  Good   Akathisia:  Negative  Assets:  Communication Skills Desire for Improvement Resilience  ADL's:  independent  Cognition:  WNL  Sleep:  Number of  Hours: 6.25   Treatment Plan Summary: Diagnoses / Active Problems: Unspecified schizophrenia spectrum and other psychotic d/o (r/o substance induced psychotic d/o, r/o schizoaffective d/o, r/o schizophreniform d/o, r/o psychotic d/o secondary to a general medical condition) Opiate use d/o on MAT Cannabis use episodic- r/o cannabis use d/o  PLAN: 1. Safety and Monitoring:  -- Involuntary admission to inpatient psychiatric unit for safety, stabilization and treatment  -- Daily contact with patient to assess and evaluate symptoms and progress in treatment  -- Patient's case to be discussed in multi-disciplinary team meeting  -- Observation Level : q15 minute checks  -- Vital signs:  q12 hours  -- Precautions: suicide  2. Psychiatric Diagnoses and Treatment:   Unspecified schizophrenia spectrum and other psychotic d/o (r/o substance induced psychotic d/o, r/o schizoaffective d/o, r/o schizophreniform d/o, r/o psychotic d/o secondary to a general medical condition) -- Continue Risperdal M tabs 32m po bid  -- Continue Haldol 2.560mpo or IM bid per forced medication protocol  -- In context of new onset psychosis symptoms will proceed with additional medical w/u to r/o an organic etiology to her presentation (Noncontrast Head CT negative 08/10/20; WBC 9.1, TSH 2.090, ETOH <10, UDS positive for THC) RPR, HIV, ceruloplasmin, B12, CRP, ESR, ANA pending - patient missed morning labs so reordered for tonight  -- Metabolic profile and EKG monitoring obtained while on an atypical antipsychotic (Lipid Panel:cholesterol 169, triglycerides 62, HDL 37, LDL 120; HbgA1c:5.4 QTc:4621m  -- Continue Cogentin 0.5mg71md PRN tremor/EPS  -- Agitation protocol (Ativan 1mg 64mor IM q6 hours PRN agitation, Zyprexa zydis 10mg 14m8 hours PRN agitation, and Geodon 20mg I49m2  hours PRN agitation)  -- Continue Trazodone 50mg po52m PRN insomnia  -- Continue Vistaril 25mg tid87m anxiety  -- Encouraged patient to participate in unit milieu and in scheduled group therapies   -- Short Term Goals: Ability to identify changes in lifestyle to reduce recurrence of condition will improve  -- Long Term Goals: Improvement in symptoms so as ready for discharge   Opiate use d/o on MAT  Cannabis use episodic- r/o Cannabis use d/o  -- Continue suboxone 2/0.5mg tid [id number]  -- Short Term Goals: Ability to identify triggers associated with substance abuse/mental health issues will improve  -- Long Term Goals: Improvement in symptoms so as ready for discharge   3. Medical Issues Being Addressed:   HTN  -- Continue HCTZ 25mg daily47m- If hyponatremia and hypokalemia persist, consider change to different antihypertensive agent   Hyponatremia (Na+ 134) and Hypokalemia (K+ 3.4)  -- Rechecking CMP tonight  4. Discharge Planning:   -- Social work and case management to assist with discharge planning and identification of hospital follow-up needs prior to discharge  -- Estimated LOS: 5-7 days  -- Discharge Concerns: Need to establish a safety plan; Medication compliance and effectiveness  -- Discharge Goals: Return home with outpatient referrals for mental health follow-up including medication management/psychotherapy   Deborah Dondero E SinglHarlow Asa4/13/2022, 8:17 AM

## 2020-08-21 NOTE — Tx Team (Signed)
Interdisciplinary Treatment and Diagnostic Plan Update  08/21/2020 Time of Session: 9:45am Susan English MRN: [id number]  Principal Diagnosis: Schizophrenia spectrum disorder with psychotic disorder type not yet determined [facility])  Secondary Diagnoses: Principal Problem:   Schizophrenia spectrum disorder with psychotic disorder type not yet determined (HCC) Active Problems:   Marijuana abuse   Opiate use   Current Medications:  Current Facility-Administered Medications  Medication Dose Route Frequency Provider Last Rate Last Admin  . acetaminophen (TYLENOL) tablet 650 mg  650 mg Oral Q6H PRN Jaclyn Shaggy, PA-C   650 mg at 08/20/20 1007  . alum & mag hydroxide-simeth (MAALOX/MYLANTA) 200-200-20 MG/5ML suspension 30 mL  30 mL Oral Q4H PRN Melbourne Abts W, PA-C      . benztropine (COGENTIN) tablet 0.5 mg  0.5 mg Oral BID PRN Mason Jim, Amy E, MD      . buprenorphine-naloxone (SUBOXONE) 2-0.5 mg per SL tablet 1 tablet  1 tablet Sublingual TID WC & HS Antonieta Pert, MD   1 tablet at 08/21/20 0818  . haloperidol (HALDOL) tablet 2.5 mg  2.5 mg Oral BID Mason Jim, Amy E, MD   2.5 mg at 08/21/20 0818   Or  . haloperidol lactate (HALDOL) injection 2.5 mg  2.5 mg Intramuscular BID Mason Jim, Amy E, MD      . hydrochlorothiazide (HYDRODIURIL) tablet 25 mg  25 mg Oral q morning Antonieta Pert, MD   25 mg at 08/21/20 0818  . hydrOXYzine (ATARAX/VISTARIL) tablet 25 mg  25 mg Oral TID PRN Jaclyn Shaggy, PA-C   25 mg at 08/20/20 2048  . LORazepam (ATIVAN) injection 1 mg  1 mg Intramuscular Q6H PRN Mason Jim, Amy E, MD      . magnesium hydroxide (MILK OF MAGNESIA) suspension 30 mL  30 mL Oral Daily PRN Melbourne Abts W, PA-C      . nicotine polacrilex (NICORETTE) gum 2 mg  2 mg Oral PRN Nwoko, Uchenna E, PA   2 mg at 08/20/20 2201  . ziprasidone (GEODON) injection 20 mg  20 mg Intramuscular Q12H PRN Comer Locket, MD       And  . OLANZapine zydis (ZYPREXA) disintegrating tablet 10 mg  10 mg  Oral Q8H PRN Mason Jim, Amy E, MD      . risperiDONE (RISPERDAL M-TABS) disintegrating tablet 3 mg  3 mg Oral QAC breakfast Antonieta Pert, MD   3 mg at 08/21/20 0818  . risperiDONE (RISPERDAL M-TABS) disintegrating tablet 3 mg  3 mg Oral QHS Comer Locket, MD   3 mg at 08/20/20 2048  . traZODone (DESYREL) tablet 50 mg  50 mg Oral QHS PRN Jaclyn Shaggy, PA-C   50 mg at 08/20/20 2047   PTA Medications: Medications Prior to Admission  Medication Sig Dispense Refill Last Dose  . cloNIDine (CATAPRES) 0.1 MG tablet Take 0.2 mg by mouth at bedtime.   Past Week at Unknown time  . hydrochlorothiazide (HYDRODIURIL) 25 MG tablet Take 50 mg by mouth every morning.   Past Week at Unknown time  . traZODone (DESYREL) 50 MG tablet Take 50-100 mg by mouth at bedtime as needed for sleep.   Past Week at Unknown time  . ACCU-CHEK FASTCLIX LANCETS MISC 1 Device by Does not apply route 4 (four) times daily. (Patient not taking: No sig reported) 100 each 2 Unknown at Unknown time  . cyclobenzaprine (FLEXERIL) 5 MG tablet Take 1 tablet (5 mg total) by mouth every 8 (eight) hours as needed for muscle  spasms. (Patient not taking: No sig reported) 20 tablet 0 Unknown at Unknown time  . diphenhydrAMINE (BENADRYL) 25 MG tablet Take 25 mg by mouth as needed for allergies.   Unknown at Unknown time  . glucose blood (ACCU-CHEK GUIDE) test strip Use as instructed QID (Patient not taking: No sig reported) 100 each 12 Unknown at Unknown time  . hydrOXYzine (ATARAX/VISTARIL) 10 MG tablet Take 1 tablet (10 mg total) by mouth 3 (three) times daily as needed. (Patient not taking: No sig reported) 30 tablet 2 Unknown at Unknown time  . hydrOXYzine (VISTARIL) 50 MG capsule Take 50 mg by mouth 2 (two) times daily as needed for anxiety.   Unknown at Unknown time  . prenatal vitamin w/FE, FA (PRENATAL 1 + 1) 27-1 MG TABS tablet Take 1 tablet by mouth daily at 12 noon. (Patient not taking: No sig reported) 30 each 9 Unknown at Unknown  time  . SUBOXONE 8-2 MG FILM Place 1.5 Film under the tongue 4 (four) times daily.   Unknown at Unknown time    Patient Stressors: Medication change or noncompliance Substance abuse Other: Patient reports feeling overwhelmed with life in general  Patient Strengths: Ability for insight Active sense of humor Average or above average intelligence Capable of independent living Motivation for treatment/growth Supportive family/friends Work skills  Treatment Modalities: Medication Management, Group therapy, Case management,  1 to 1 session with clinician, Psychoeducation, Recreational therapy.   Physician Treatment Plan for Primary Diagnosis: Schizophrenia spectrum disorder with psychotic disorder type not yet determined (HCC) Long Term Goal(s): Improvement in symptoms so as ready for discharge Improvement in symptoms so as ready for discharge   Short Term Goals: Ability to identify changes in lifestyle to reduce recurrence of condition will improve Ability to identify triggers associated with substance abuse/mental health issues will improve  Medication Management: Evaluate patient's response, side effects, and tolerance of medication regimen.  Therapeutic Interventions: 1 to 1 sessions, Unit Group sessions and Medication administration.  Evaluation of Outcomes: Progressing  Physician Treatment Plan for Secondary Diagnosis: Principal Problem:   Schizophrenia spectrum disorder with psychotic disorder type not yet determined (HCC) Active Problems:   Marijuana abuse   Opiate use  Long Term Goal(s): Improvement in symptoms so as ready for discharge Improvement in symptoms so as ready for discharge   Short Term Goals: Ability to identify changes in lifestyle to reduce recurrence of condition will improve Ability to identify triggers associated with substance abuse/mental health issues will improve     Medication Management: Evaluate patient's response, side effects, and tolerance  of medication regimen.  Therapeutic Interventions: 1 to 1 sessions, Unit Group sessions and Medication administration.  Evaluation of Outcomes: Progressing   RN Treatment Plan for Primary Diagnosis: Schizophrenia spectrum disorder with psychotic disorder type not yet determined (HCC) Long Term Goal(s): Knowledge of disease and therapeutic regimen to maintain health will improve  Short Term Goals: Ability to participate in decision making will improve, Ability to verbalize feelings will improve, Ability to identify and develop effective coping behaviors will improve and Compliance with prescribed medications will improve  Medication Management: RN will administer medications as ordered by provider, will assess and evaluate patient's response and provide education to patient for prescribed medication. RN will report any adverse and/or side effects to prescribing provider.  Therapeutic Interventions: 1 on 1 counseling sessions, Psychoeducation, Medication administration, Evaluate responses to treatment, Monitor vital signs and CBGs as ordered, Perform/monitor CIWA, COWS, AIMS and Fall Risk screenings as ordered, Perform wound care  treatments as ordered.  Evaluation of Outcomes: Progressing   LCSW Treatment Plan for Primary Diagnosis: Schizophrenia spectrum disorder with psychotic disorder type not yet determined (HCC) Long Term Goal(s): Safe transition to appropriate next level of care at discharge, Engage patient in therapeutic group addressing interpersonal concerns.  Short Term Goals: Engage patient in aftercare planning with referrals and resources, Increase ability to appropriately verbalize feelings, Increase emotional regulation, Facilitate acceptance of mental health diagnosis and concerns, Identify triggers associated with mental health/substance abuse issues and Increase skills for wellness and recovery  Therapeutic Interventions: Assess for all discharge needs, 1 to 1 time with Social  worker, Explore available resources and support systems, Assess for adequacy in community support network, Educate family and significant other(s) on suicide prevention, Complete Psychosocial Assessment, Interpersonal group therapy.  Evaluation of Outcomes: Progressing   Progress in Treatment: Attending groups: No. Participating in groups: No. Taking medication as prescribed: Yes. Toleration medication: Yes. Family/Significant other contact made: No, will contact:  Eilleen Kempf, mother Patient understands diagnosis: No. Discussing patient identified problems/goals with staff: No. Medical problems stabilized or resolved: Yes. Denies suicidal/homicidal ideation: Yes. Issues/concerns per patient self-inventory: No.   New problem(s) identified: No, Describe:  none reported  New Short Term/Long Term Goal(s): medication management for mood stabilization; elimination of SI thoughts; development of comprehensive mental wellness/sobriety plan   Patient Goals:  "To go home"  Discharge Plan or Barriers: Patient recently admitted. CSW will continue to follow and assess for appropriate referrals and possible discharge planning.    Reason for Continuation of Hospitalization: Depression Mania Medication stabilization Other; describe disorganized thoughts   Estimated Length of Stay: 3-5 days  Attendees: Patient: Susan English 08/16/2020   Physician: Clifton Custard, MD 08/16/2020   Nursing:  08/16/2020   RN Care Manager: 08/16/2020   Social Worker: Anson [place], LCSW 08/16/2020   Recreational Therapist:  08/16/2020   Other:  08/16/2020   Other:  08/16/2020   Other: 08/16/2020       Scribe for Treatment Team: Felizardo Hoffmann, LCSWA 08/21/2020 11:40 AM

## 2020-08-21 NOTE — Progress Notes (Signed)
Recreation Therapy Notes  Date: 4.13.22 Time: 1015 Location: [address] Dayroom  Group Topic: Triggers  Goal Area(s) Addresses:  Patient will identify triggers.  Patient will verbalize how to deal with triggers. Patient will verbalize how triggers can be dealt with post d/c.  Intervention: Worksheet  Activity: Triggers.  Patients were to identify their three biggest triggers.  Patient would then identify how to avoid those triggers and how to deal with them when they can't be avoided.  Education: Communication, Discharge Planning  Education Outcome: Acknowledges understanding/In group clarification offered/Needs additional education.   Clinical Observations/Feedback: Pt did not attend group session.      Caroll Rancher, LRT/CTRS         Lillia Abed, Alexys Gassett A 08/21/2020 1:21 PM

## 2020-08-21 NOTE — [facility] Group Notes (Signed)
LCSW Group Therapy 08/21/2020 1:15pm  Type of Therapy and Topic:  Group Therapy:  Change and Accountability  Participation Level:  Did Not Attend  Description of Group In this group, patients discussed power and accountability for change.  The group identified the challenges related to accountability and the difficulty of accepting the outcomes of negative behaviors.  Patients were encouraged to openly discuss a challenge/change they could take responsibility for.  Patients discussed the use of "change talk" and positive thinking as ways to support achievement of personal goals.  The group discussed ways to give support and empowerment to peers.  Therapeutic Goals: 1. Patients will state the relationship between personal power and accountability in the change process 2. Patients will identify the positive and negative consequences of a personal choice they have made 3. Patients will identify one challenge/choice they will take responsibility for making 4. Patients will discuss the role of "change talk" and the impact of positive thinking as it supports successful personal change 5. Patients will verbalize support and affirmation of change efforts in peers  Summary of Patient Progress: Did not attend.   Therapeutic Modalities Solution Focused Brief Therapy Motivational Interviewing Cognitive Behavioral Therapy    Nyaira Hodgens A Ubaldo Daywalt, LCSW 08/21/2020 2:02 PM  

## 2020-08-22 LAB — COMPREHENSIVE METABOLIC PANEL
ALT: 14 U/L (ref 0–44)
AST: 16 U/L (ref 15–41)
Albumin: 3.7 g/dL (ref 3.5–5.0)
Alkaline Phosphatase: 75 U/L (ref 38–126)
Anion gap: 9 (ref 5–15)
BUN: 12 mg/dL (ref 6–20)
CO2: 28 mmol/L (ref 22–32)
Calcium: 9.5 mg/dL (ref 8.9–10.3)
Chloride: 97 mmol/L — ABNORMAL LOW (ref 98–111)
Creatinine, Ser: 0.66 mg/dL (ref 0.44–1.00)
GFR, Estimated: >60 mL/min (ref >60)
Glucose, Bld: 119 mg/dL — ABNORMAL HIGH (ref 70–99)
Potassium: 3.3 mmol/L — ABNORMAL LOW (ref 3.5–5.1)
Sodium: 134 mmol/L — ABNORMAL LOW (ref 135–145)
Total Bilirubin: 0.7 mg/dL (ref 0.3–1.2)
Total Protein: 7.7 g/dL (ref 6.5–8.1)

## 2020-08-22 LAB — C-REACTIVE PROTEIN: CRP: 0.7 mg/dL (ref <1.0)

## 2020-08-22 LAB — SEDIMENTATION RATE: Sed Rate: 28 mm/hr — ABNORMAL HIGH (ref 0–22)

## 2020-08-22 LAB — VITAMIN B12: Vitamin B-12: 555 pg/mL (ref 180–914)

## 2020-08-22 LAB — HIV ANTIBODY (ROUTINE TESTING W REFLEX): HIV Screen 4th Generation wRfx: NONREACTIVE

## 2020-08-22 LAB — RPR: RPR Ser Ql: NONREACTIVE

## 2020-08-22 LAB — MAGNESIUM: Magnesium: 1.8 mg/dL (ref 1.7–2.4)

## 2020-08-22 MED ORDER — RISPERIDONE 1 MG PO TBDP: 1.0000 mg | ORAL_TABLET | Freq: Every day | ORAL | Status: DC

## 2020-08-22 MED ORDER — POTASSIUM CHLORIDE CRYS ER 20 MEQ PO TBCR
20.0000 meq | EXTENDED_RELEASE_TABLET | Freq: Once | ORAL | Status: AC
  Administered 2020-08-22: 20 meq via ORAL
  Filled 2020-08-22 (×2): qty 1

## 2020-08-22 MED ORDER — POTASSIUM CHLORIDE CRYS ER 20 MEQ PO TBCR
20.0000 meq | EXTENDED_RELEASE_TABLET | Freq: Two times a day (BID) | ORAL | Status: AC
  Administered 2020-08-22 – 2020-08-23 (×3): 20 meq via ORAL
  Filled 2020-08-22 (×3): qty 1

## 2020-08-22 MED ORDER — RISPERIDONE 2 MG PO TBDP
2.0000 mg | ORAL_TABLET | Freq: Every day | ORAL | Status: DC
  Administered 2020-08-22 – 2020-08-23 (×2): 2 mg via ORAL
  Filled 2020-08-22 (×4): qty 1

## 2020-08-22 MED ORDER — RISPERIDONE 2 MG PO TBDP
2.0000 mg | ORAL_TABLET | Freq: Every day | ORAL | Status: DC
  Administered 2020-08-23 – 2020-08-24 (×2): 2 mg via ORAL
  Filled 2020-08-22 (×4): qty 1

## 2020-08-22 MED ORDER — LOSARTAN POTASSIUM 25 MG PO TABS
25.0000 mg | ORAL_TABLET | Freq: Every day | ORAL | Status: DC
  Administered 2020-08-22 – 2020-08-24 (×3): 25 mg via ORAL
  Filled 2020-08-22 (×5): qty 1

## 2020-08-22 NOTE — Progress Notes (Signed)
Pt stated she was doing and feeling better. Pt visible in the dayroom interacting with peers and staff. Pt said she was feeling more like her old self. Pt given PRN Trazodone and Vistaril per MAR with HS medication    08/22/20 2200  Psych Admission Type (Psych Patients Only)  Admission Status Involuntary  Psychosocial Assessment  Patient Complaints None  Eye Contact Suspiciousness  Facial Expression Flat  Affect Blunted  Speech Soft  Interaction Cautious;Guarded;Isolative  Motor Activity Fidgety  Appearance/Hygiene Improved  Behavior Characteristics Cooperative  Mood Pleasant  Thought Process  Coherency WDL  Content Paranoia  Delusions Paranoid  Perception WDL  Hallucination None reported or observed  Judgment Poor  Confusion None  Danger to Self  Current suicidal ideation? Denies  Danger to Others  Danger to Others None reported or observed

## 2020-08-22 NOTE — Progress Notes (Signed)
Pt visible some this evening on the unit. Pt appeared to have appropriate responses without thought blocking . Pt had unprovoked interaction when she came to staff to ask various appropriate questions. Pt given PRN Tylenol-hip pain, Vistaril and Trazodone per MAR with HS medication.     08/22/20 0000  Psych Admission Type (Psych Patients Only)  Admission Status Involuntary  Psychosocial Assessment  Patient Complaints None  Eye Contact Suspiciousness  Facial Expression Flat  Affect Blunted  Speech Soft  Interaction Cautious;Guarded;Isolative  Motor Activity Fidgety  Appearance/Hygiene Improved  Behavior Characteristics Cooperative  Mood Preoccupied;Suspicious  Thought Process  Coherency WDL  Content Paranoia  Delusions Paranoid  Perception WDL  Hallucination None reported or observed  Judgment Poor  Confusion None  Danger to Self  Current suicidal ideation? Denies  Danger to Others  Danger to Others None reported or observed

## 2020-08-22 NOTE — Progress Notes (Signed)
[facility] MD Progress Note  08/22/2020 10:51 AM Susan English  MRN:  [id number]   Chief Complaint: psychosis  Subjective:  Susan English is a 30 y.o. female with a history of opiate use d/o on MAT, who was initially admitted for inpatient psychiatric hospitalization on 08/11/2020 for management of agitation, psychosis, and paranoia. Her UDS on admission was positive for THC. The patient is currently on Hospital Day 11.   Chart Review from last 24 hours:  The patient's chart was reviewed and nursing notes were reviewed. The patient's case was discussed in multidisciplinary team meeting. Per nursing, she was isolative to her room most of the day and appeared paranoid with morning med administration. On night shift she was more visible on the unit and had appropriate responses to questions. Per MAR she was compliant with scheduled medications and received Vistaril X1 for anxiety and Trazodone X1 for sleep.  Information Obtained Today During Patient Interview: The patient was seen and evaluated on the unit. She states she is feeling better each day and that her thoughts are getting clearer. She reports good sleep and appetite and voices no physical complaints. I advised that her potassium and sodium are still low and that I have talked with hospitalist who suggests that she have repeat labs for the next several days to monitor electrolytes and renal function, that she change antihypertensive to Losartan, and that she get potassium replacement for 2 days. She was in agreement with this plan. She denies current cravings for opiates or acute withdrawal with dose reduction in her suboxone since admission. She denies current medication side-effects. She was encouraged to be less isolative and more engaged in groups and the milieu now that her psychosis and paranoia are improving. She denies current AVH, ideas of reference, first rank symtpoms, or paranoia today. We discussed potential discharge this weekend if she  continues to make interval improvements in terms of paranoia and if family can confirm she is returning to baseline.   Principal Problem: Schizophrenia spectrum disorder with psychotic disorder type not yet determined ([place]) Diagnosis: Principal Problem:   Schizophrenia spectrum disorder with psychotic disorder type not yet determined ([place]) Active Problems:   Marijuana abuse   Opiate use  Total Time Spent in Direct Patient Care:  I personally spent 30 minutes on the unit in direct patient care. The direct patient care time included face-to-face time with the patient, reviewing the patient's chart, communicating with other professionals, and coordinating care. Greater than 50% of this time was spent in counseling or coordinating care with the patient regarding goals of hospitalization, psycho-education, and discharge planning needs.  Past Psychiatric History: see admission H&P  Past Medical History:  Past Medical History:  Diagnosis Date  . Headache(784.0)   . 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 Hypertension   . Obesity     Past Surgical History:  Procedure Laterality Date  . CESAREAN SECTION    . ORIF ANKLE FRACTURE Right 12/22/2012   Procedure: OPEN REDUCTION INTERNAL FIXATION (ORIF) ANKLE FRACTURE;  Surgeon: Nita Sells, MD;  Location: [facility];  Service: Orthopedics;  Laterality: Right;  . sweat gland     Family History:  Family History  Problem Relation Age of Onset  . Hypertension Mother   . Diabetes Mother   . Diabetes Maternal Grandmother   . Breast cancer Paternal Grandmother    Family Psychiatric  History: see admission H&P  Social History:  Social  History   Substance and Sexual Activity  Alcohol Use No   Comment: occ     Social History   Substance and Sexual Activity  Drug Use Yes  . Types: Marijuana, Other-see comments   Comment: pt reports CBD use also    Social History    Socioeconomic History  . Marital status: Single    Spouse name: n/a  . Number of children: 2  . Years of education: Not on file  . Highest education level: Associate degree: occupational, Hotel manager, or vocational program  Occupational History  . Not on file  Tobacco Use  . Smoking status: Current Every Day Smoker    Packs/day: 0.50    Types: Cigarettes  . Smokeless tobacco: Never Used  Vaping Use  . Vaping Use: Never used  Substance and Sexual Activity  . Alcohol use: No    Comment: occ  . Drug use: Yes    Types: Marijuana, Other-see comments    Comment: pt reports CBD use also  . Sexual activity: Yes    Birth control/protection: Implant  Other Topics Concern  . Not on file  Social History Narrative  . Not on file   Social Determinants of Health   Financial Resource Strain: Not on file  Food Insecurity: Not on file  Transportation Needs: Not on file  Physical Activity: Not on file  Stress: Not on file  Social Connections: Not on file   Sleep: Good  Appetite:  Good  Current Medications: Current Facility-Administered Medications  Medication Dose Route Frequency Provider Last Rate Last Admin  . acetaminophen (TYLENOL) tablet 650 mg  650 mg Oral Q6H PRN Prescilla Sours, PA-C   650 mg at 08/21/20 2057  . alum & mag hydroxide-simeth (MAALOX/MYLANTA) 200-200-20 MG/5ML suspension 30 mL  30 mL Oral Q4H PRN Margorie John W, PA-C      . benztropine (COGENTIN) tablet 0.5 mg  0.5 mg Oral BID PRN Nelda Marseille, Jalea Bronaugh E, MD      . buprenorphine-naloxone (SUBOXONE) 2-0.5 mg per SL tablet 1 tablet  1 tablet Sublingual TID WC & HS Sharma Covert, MD   1 tablet at 08/22/20 0945  . haloperidol (HALDOL) tablet 2.5 mg  2.5 mg Oral BID Viann Fish E, MD   2.5 mg at 08/22/20 5462   Or  . haloperidol lactate (HALDOL) injection 2.5 mg  2.5 mg Intramuscular BID Nelda Marseille, Ayelet Gruenewald E, MD      . hydrOXYzine (ATARAX/VISTARIL) tablet 25 mg  25 mg Oral TID PRN Prescilla Sours, PA-C   25 mg at  08/21/20 2057  . LORazepam (ATIVAN) injection 1 mg  1 mg Intramuscular Q6H PRN Nelda Marseille, Severina Sykora E, MD      . LORazepam (ATIVAN) tablet 1 mg  1 mg Oral Q6H PRN Nelda Marseille, Alem Fahl E, MD      . losartan (COZAAR) tablet 25 mg  25 mg Oral Daily Kyle, Tyrone A, DO   25 mg at 08/22/20 0945  . magnesium hydroxide (MILK OF MAGNESIA) suspension 30 mL  30 mL Oral Daily PRN Margorie John W, PA-C      . nicotine polacrilex (NICORETTE) gum 2 mg  2 mg Oral PRN Nwoko, Uchenna E, PA   2 mg at 08/21/20 1625  . ziprasidone (GEODON) injection 20 mg  20 mg Intramuscular Q12H PRN Harlow Asa, MD       And  . OLANZapine zydis (ZYPREXA) disintegrating tablet 10 mg  10 mg Oral Q8H PRN Harlow Asa, MD      .  potassium chloride SA (KLOR-CON) CR tablet 20 mEq  20 mEq Oral BID Marylyn Ishihara, Tyrone A, DO      . risperiDONE (RISPERDAL M-TABS) disintegrating tablet 3 mg  3 mg Oral QAC breakfast Sharma Covert, MD   3 mg at 08/22/20 0942  . risperiDONE (RISPERDAL M-TABS) disintegrating tablet 3 mg  3 mg Oral QHS Viann Fish E, MD   3 mg at 08/21/20 2057  . traZODone (DESYREL) tablet 50 mg  50 mg Oral QHS PRN Prescilla Sours, PA-C   50 mg at 08/21/20 2057    Lab Results:  Results for orders placed or performed during the hospital encounter of 08/11/20 (from the past 48 hour(s))  Vitamin B12     Status: None   Collection Time: 08/22/20  6:42 AM  Result Value Ref Range   Vitamin B-12 555 180 - 914 pg/mL    Comment: (NOTE) This assay is not validated for testing neonatal or myeloproliferative syndrome specimens for Vitamin B12 levels. Performed at [facility], [place] [address], [place], [place] [address]   Comprehensive metabolic panel     Status: Abnormal   Collection Time: 08/22/20  6:42 AM  Result Value Ref Range   Sodium 134 (L) 135 - 145 mmol/L   Potassium 3.3 (L) 3.5 - 5.1 mmol/L   Chloride 97 (L) 98 - 111 mmol/L   CO2 28 22 - 32 mmol/L   Glucose, Bld 119 (H) 70 - 99 mg/dL    Comment: Glucose  reference range applies only to samples taken after fasting for at least 8 hours.   BUN 12 6 - 20 mg/dL   Creatinine, Ser 0.66 0.44 - 1.00 mg/dL   Calcium 9.5 8.9 - 10.3 mg/dL   Total Protein 7.7 6.5 - 8.1 g/dL   Albumin 3.7 3.5 - 5.0 g/dL   AST 16 15 - 41 U/L   ALT 14 0 - 44 U/L   Alkaline Phosphatase 75 38 - 126 U/L   Total Bilirubin 0.7 0.3 - 1.2 mg/dL   GFR, Estimated >60 >60 mL/min    Comment: (NOTE) Calculated using the CKD-EPI Creatinine Equation (2021)    Anion gap 9 5 - 15    Comment: Performed at [facility], [place] [address] [place], [place] [address]  C-reactive protein     Status: None   Collection Time: 08/22/20  6:42 AM  Result Value Ref Range   CRP 0.7 <1.0 mg/dL    Comment: Performed at [facility], [place] [address], [place], [place] [address]  Sedimentation rate     Status: Abnormal   Collection Time: 08/22/20  6:42 AM  Result Value Ref Range   Sed Rate 28 (H) 0 - 22 mm/hr    Comment: Performed at [facility], [place] [address], [place], [place] [address]  HIV Antibody (routine testing w rflx)     Status: None   Collection Time: 08/22/20  6:42 AM  Result Value Ref Range   HIV Screen 4th Generation wRfx Non Reactive Non Reactive    Comment: Performed at [facility], [place] [address], [place], [place] [address]    Blood Alcohol level:  Lab Results  Component Value Date   [facility] <10 84/16/6063    Metabolic Disorder Labs: Lab Results  Component Value Date   HGBA1C 5.4 08/19/2020   MPG 108.28 08/19/2020   No results found for: PROLACTIN Lab Results  Component Value Date   CHOL 169 08/19/2020  TRIG 62 08/19/2020   HDL 37 (L) 08/19/2020   CHOLHDL 4.6 08/19/2020   VLDL 12 08/19/2020   LDLCALC 120 (H) 08/19/2020    Physical Findings: AIMS: Facial and Oral Movements Muscles of Facial Expression: None, normal Lips and Perioral Area: None, normal Jaw: None, normal Tongue: None, normal,Extremity  Movements Upper (arms, wrists, hands, fingers): None, normal Lower (legs, knees, ankles, toes): None, normal, Trunk Movements Neck, shoulders, hips: None, normal, Overall Severity Severity of abnormal movements (highest score from questions above): None, normal Incapacitation due to abnormal movements: None, normal Patient's awareness of abnormal movements (rate only patient's report): No Awareness, Dental Status Current problems with teeth and/or dentures?: No Does patient usually wear dentures?: No       Musculoskeletal: Strength & Muscle Tone: within normal limits Gait & Station: steady, normal Patient leans: N/A  Psychiatric Specialty Exam: Physical Exam Vitals reviewed.  HENT:     Head: Normocephalic.  Pulmonary:     Effort: Pulmonary effort is normal.  Neurological:     Mental Status: She is alert.     Review of Systems  Respiratory: Negative for shortness of breath.   Cardiovascular: Negative for chest pain.  Gastrointestinal: Negative for diarrhea, nausea and vomiting.  Neurological: Negative for headaches.    Blood pressure 120/84, pulse (!) 103, temperature 97.9 F (36.6 C), temperature source Oral, resp. rate 16, height _0  (1.727 m), weight 124.7 kg, SpO2 99 %, unknown if currently breastfeeding.Body mass index is 41.81 kg/m.  General Appearance: casually dressed, fair hygiene, wearing hospital gown  Eye Contact:  Good  Speech:  Clear and Coherent and Normal Rate  Volume:  Normal  Mood:  Described as "good" - appears calm   Affect:  Constricted, mildly guarded  Thought Process:  Superficially goal directed and more linear  Orientation:  Oriented to self, year, month, and President  Thought Content:  Denies paranoia, AVH, ideas of reference, or first rank symptoms; does not appear to be grossly responding to internal/external stimuli on exam; still mildly guarded and suspicious on exam  Suicidal Thoughts:  No  Homicidal Thoughts:  No  Memory:  Fair   Judgement:  Fair  Insight:  improving  Psychomotor Activity:  Normal  Concentration:  Concentration: Fair  Recall: [employer] of Knowledge:  Fair  Language:  Good  Akathisia:  Negative  Assets:  Communication Skills Desire for Improvement Resilience  ADL's:  independent  Cognition:  WNL  Sleep:  Number of Hours: 6.75   Treatment Plan Summary: Diagnoses / Active Problems: Unspecified schizophrenia spectrum and other psychotic d/o (r/o substance induced psychotic d/o, r/o schizoaffective d/o, r/o schizophreniform d/o, r/o psychotic d/o secondary to a general medical condition) Opiate use d/o on MAT Cannabis use episodic- r/o cannabis use d/o  PLAN: 1. Safety and Monitoring:  -- Involuntary admission to inpatient psychiatric unit for safety, stabilization and treatment  -- Daily contact with patient to assess and evaluate symptoms and progress in treatment  -- Patient's case to be discussed in multi-disciplinary team meeting  -- Observation Level : q15 minute checks  -- Vital signs:  q12 hours  -- Precautions: suicide  2. Psychiatric Diagnoses and Treatment:   Unspecified schizophrenia spectrum and other psychotic d/o (r/o substance induced psychotic d/o, r/o schizoaffective d/o, r/o schizophreniform d/o, r/o psychotic d/o secondary to a general medical condition) -- Continue Risperdal M tabs 110m po bid  -- Continue Haldol 2.580mpo or IM bid per forced medication protocol   -- Medical w/u  to r/o an organic etiology to her presentation (Noncontrast Head CT negative 08/10/20; WBC 9.1, TSH 2.090, ETOH <10, UDS positive for THC; HIV nonreactive; ESR slightly elevated at 28; CRP 0.7, B12 555; ) RPR, ceruloplasmin, and ANA pending  -- Metabolic profile and EKG monitoring obtained while on an atypical antipsychotic (Lipid Panel:cholesterol 169, triglycerides 62, HDL 37, LDL 120; HbgA1c:5.4 QTc:480m)   -- Continue Cogentin 0.560mbid PRN tremor/EPS  -- Agitation protocol (Ativan 35m103mo or  IM q6 hours PRN agitation, Zyprexa zydis 63m135m q8 hours PRN agitation, and Geodon 20mg84mq12 hours PRN agitation)  -- Continue Trazodone 50mg 64mhs PRN insomnia  -- Continue Vistaril 25mg t65mRN anxiety  -- Encouraged patient to participate in unit milieu and in scheduled group therapies   -- Short Term Goals: Ability to identify changes in lifestyle to reduce recurrence of condition will improve  -- Long Term Goals: Improvement in symptoms so as ready for discharge   Opiate use d/o on MAT  Cannabis use episodic- r/o Cannabis use d/o  -- Continue suboxone 2/0.5mg tid64md qhs  -- Short Term Goals: Ability to identify triggers associated with substance abuse/mental health issues will improve  -- Long Term Goals: Improvement in symptoms so as ready for discharge   3. Medical Issues Being Addressed:   HTN  -- Spoke with Dr. Kyle on-Marylyn Ishihara for hospitalist and he recommends holding HCTZ given electrolyte changes and will start Losartan 25mg dai7m Hyponatremia (Na+ 134) and Hypokalemia (K+ 3.4)  --  CMP today shows Na+ 134 and K+ 3.3 - stopping HCTZ and will give K+ replacement 20meq tod5m2 for 2 days per hospitalist recommendations; checking Mag+ and renal function panel daily for 2 days per hospitalist recommendation   Transient tachycardia  -- Repeating resting HR and checking EKG  4. Discharge Planning:   -- Social work and case management to assist with discharge planning and identification of hospital follow-up needs prior to discharge  -- Estimated LOS: 2-3 days  -- Discharge Concerns: Need to establish a safety plan; Medication compliance and effectiveness  -- Discharge Goals: Return home with outpatient referrals for mental health follow-up including medication management/psychotherapy   Tylon Kemmerling E SingHarlow Asa 08/22/2020, 10:51 AM
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
## 2020-08-22 NOTE — [facility] Group Notes (Signed)
Occupational Therapy Group Note Date: 08/22/2020 Group Topic/Focus: Feelings Management  Group Description: Group encouraged increased engagement and participation through discussion focused on Building Happiness. Patients were provided a handout and reviewed therapeutic strategies to build happiness including identifying gratitudes, random acts of kindness, exercise, meditation, positive journaling, and fostering relationships. Patients engaged in discussion and encouraged to reflect on each strategy and their experiences. Participation Level: Active   Participation Quality: Independent   Behavior: Calm, Cooperative and Interactive   Speech/Thought Process: Focused   Affect/Mood: Euthymic   Insight: Fair   Judgement: Fair   Individualization: Zeinab was active and engaged in their participation of group discussion/activity, appeared much brighter than noted in last week's group session. Pt identified finding happiness by 'giving to others' however was able to recognize that she gives, gives, gives, and often does not receive anything in return. She recognized that this played a role in her current hospitalization and is working hard to engage in self-care and do things for herself and her two children. Receptive to education and strategies offered to her during group.   Modes of Intervention: Discussion, Socialization and Support  Patient Response to Interventions:  Attentive, Engaged and Receptive   Plan: Continue to engage patient in OT groups 2 - 3x/week.  08/22/2020  Donne Hazel, MOT, OTR/L

## 2020-08-22 NOTE — Plan of Care (Addendum)
Notified by nursing that the patient had c/o spot of galactorrhea. Will order prolactin level for tomorrow and reduce Risperdal to 2mg  bid. Will monitor for worsening psychosis with dose reduction in Risperdal.   , MD, Bartholomew Crews

## 2020-08-22 NOTE — Progress Notes (Signed)
   08/22/20 0500  Sleep  Number of Hours 6.75

## 2020-08-22 NOTE — Progress Notes (Signed)
Recreation Therapy Notes  Date: 4.14.22 Time: 1000 Location: 500 Hall Dayroom  Group Topic: Self-Esteem  Goal Area(s) Addresses:  Patient will successfully identify positive attributes about themselves.  Patient will successfully identify benefit of improved self-esteem.   Behavioral Response: Engaged  Intervention: Administrator, sports, Markers  Activity: Look In Pensions consultant.  Patients were to picture looking at themselves in the mirror and highlight the positive qualities about themselves.  Patients were encouraged to look deep because it's easy to think of the negative qualities and sometimes it's difficult to find the positive.    Education:  Self-Esteem, Discharge Planning  Education Outcome: Acknowledges education/In group clarification offered/Needs additional education  Clinical Observations/Feedback: Pt was much brighter and able to focus/engage in activity.  Picture drew a picture of himself.  Pt drew herself with colorful hair.  Pt described herself as God's unicorn which means beauty.  Pt stated she puts up with what other people want from her and doesn't do what she wants.  Pt also described herself as natural.  Pt became tearful when expressing her feelings.  Pt was encouraged to take time for herself for good self care.  Pt was receptive to the advise.   Caroll Rancher, LRT/CTRS    Lillia Abed, Amayia Ciano A 08/22/2020 11:09 AM

## 2020-08-23 DIAGNOSIS — N643 Galactorrhea not associated with childbirth: Secondary | ICD-10-CM | POA: Diagnosis not present

## 2020-08-23 DIAGNOSIS — I10 Essential (primary) hypertension: Secondary | ICD-10-CM | POA: Diagnosis present

## 2020-08-23 LAB — RENAL FUNCTION PANEL
Albumin: 3.5 g/dL (ref 3.5–5.0)
Anion gap: 6 (ref 5–15)
BUN: 18 mg/dL (ref 6–20)
CO2: 28 mmol/L (ref 22–32)
Calcium: 9.2 mg/dL (ref 8.9–10.3)
Chloride: 102 mmol/L (ref 98–111)
Creatinine, Ser: 0.74 mg/dL (ref 0.44–1.00)
GFR, Estimated: >60 mL/min (ref >60)
Glucose, Bld: 106 mg/dL — ABNORMAL HIGH (ref 70–99)
Phosphorus: 3.6 mg/dL (ref 2.5–4.6)
Potassium: 4.1 mmol/L (ref 3.5–5.1)
Sodium: 136 mmol/L (ref 135–145)

## 2020-08-23 LAB — MAGNESIUM: Magnesium: 1.8 mg/dL (ref 1.7–2.4)

## 2020-08-23 LAB — CERULOPLASMIN: Ceruloplasmin: 33.2 mg/dL (ref 19.0–39.0)

## 2020-08-23 LAB — ANA: Anti Nuclear Antibody (ANA): NEGATIVE

## 2020-08-23 NOTE — Progress Notes (Signed)
Pt has been visible on the unit , pt was moved to another room to make room for another pt to be no roommate. Pt has been appropriate on the unit, laughing and smiling  interacting appropriately. Pt given PRN Trazodone and Vistaril per MAR with HS medication    08/23/20 2200  Psych Admission Type (Psych Patients Only)  Admission Status Involuntary  Psychosocial Assessment  Patient Complaints None  Eye Contact Suspiciousness  Facial Expression Flat  Affect Blunted  Speech Soft  Interaction Cautious;Guarded;Isolative  Motor Activity Fidgety  Appearance/Hygiene Improved  Behavior Characteristics Cooperative  Mood Pleasant  Thought Process  Coherency WDL  Content Paranoia  Delusions Paranoid  Perception WDL  Hallucination None reported or observed  Judgment Poor  Confusion None  Danger to Self  Current suicidal ideation? Denies  Danger to Others  Danger to Others None reported or observed

## 2020-08-23 NOTE — Progress Notes (Addendum)
[facility] MD Progress Note  08/23/2020 3:30 PM Susan English  MRN:  [id number]   Chief Complaint: psychosis  Subjective:  Susan English is a 30 y.o. female with a history of opiate use d/o on MAT, who was initially admitted for inpatient psychiatric hospitalization on 08/11/2020 for management of agitation, psychosis, and paranoia. Her UDS on admission was positive for THC. The patient is currently on Hospital Day 12.   Chart Review from last 24 hours:  The patient's chart was reviewed and nursing notes were reviewed. The patient's case was discussed in multidisciplinary team meeting. Per nursing, the patient c/o minimal signs of galactorrhea yesterday. No behavioral issues were noted and patient was able to attend groups. Per MAR, she was compliant with scheduled medications and she received Vistaril X1 for anxiety PRN.  Information Obtained Today During Patient Interview: The patient was seen and evaluated on the unit. She states that yesterday she noticed a small wet spot on her shirt and when she squeezed her nipple she had a small discharge. She has only had one small wet spot today. We discussed galactorrhea and that this can be a side-effect of antipsychotic use. She states she is having no pain and she is not bothered by the presence of galactorrhea. I spent extensive time discussing medication options and r/b/se/a to medication changes. I discussed with the patient that the hope is that her recent psychosis was induced by [facility] and Delta 8 use and that as she continues to clear these substances we hope that her psychosis will resolve and she may not need long-term antipsychotic use after discharge. I also discussed, however, that we cannot say for certain that she does not have a new onset of a primary psychotic disorder that would require anti-psychotic management long term after discharge. I discussed that it will take time after discharge to observe her with a period of sobriety from substances to  determine a treatment plan and final diagnosis. She verbalized understanding of need to abstain from Delta 8 and [facility] use after discharge.  I discussed that all antipsychotics can potentially cause galactorrhea, but that Risperdal is commonly associated with galactorrhea and that typical agents like Haldol are commonly associated with galactorrhea. I gave her options of cross -tapering off Risperdal or Haldol and onto another atypical agent with less risk of galactorrhea like Abilify. I also discussed the option of staying on her present medications with dose gradual reduction to see if galactorrhea resolves with continued dose decrease. I cautioned that abrupt discontinuation of antipsychotics at this time could cause rebound return psychosis and that gradual dose reduction in her antipsychotics is safest. She verbalized understanding of options and states she feels mood stable on her current medication dosing and does not want to change medications. She opts to continue her present medication with dose reduction to Risperdal 2mg  bid and continued Haldol 2.5mg  bid with plans to monitor for resolution in galactorrhea over coming weeks. I advised that she has a prolactin level pending. We also reviewed her improved electrolytes and she feels stable on her new BP medication. She was advised that she will have repeat BMP tomorrow per IM consultant recommendations. She reports good sleep and appetite. She denies ideas of reference, first rank symptoms, AVH, paranoia, SI or HI. She states her thoughts are clear and her mood is stable. She requests to potentially discharge this weekend. She understands that she has an outpatient appointment with her suboxone clinic Monday and states she has Suboxone at  home to resume after discharge as bridging medicaiton until that appointment. I advised that her dose has been decreased to 2 mg qid and she verbalized understanding and ability to cut her strips in half after she is  home.  Principal Problem: Schizophrenia spectrum disorder with psychotic disorder type not yet determined ([place]) Diagnosis: Principal Problem:   Schizophrenia spectrum disorder with psychotic disorder type not yet determined ([place]) Active Problems:   Marijuana abuse   Opiate use   Galactorrhea   Hypertension  Total Time Spent in Direct Patient Care:  I personally spent 40 minutes on the unit in direct patient care. The direct patient care time included face-to-face time with the patient, reviewing the patient's chart, communicating with other professionals, and coordinating care. Greater than 50% of this time was spent in counseling or coordinating care with the patient regarding goals of hospitalization, psycho-education, and discharge planning needs.  Past Psychiatric History: see admission H&P  Past Medical History:  Past Medical History:  Diagnosis Date  . Headache(784.0)   . 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 Hypertension   . Obesity     Past Surgical History:  Procedure Laterality Date  . CESAREAN SECTION    . ORIF ANKLE FRACTURE Right 12/22/2012   Procedure: OPEN REDUCTION INTERNAL FIXATION (ORIF) ANKLE FRACTURE;  Surgeon: Nita Sells, MD;  Location: [facility];  Service: Orthopedics;  Laterality: Right;  . sweat gland     Family History:  Family History  Problem Relation Age of Onset  . Hypertension Mother   . Diabetes Mother   . Diabetes Maternal Grandmother   . Breast cancer Paternal Grandmother    Family Psychiatric  History: see admission H&P  Social History:  Social History   Substance and Sexual Activity  Alcohol Use No   Comment: occ     Social History   Substance and Sexual Activity  Drug Use Yes  . Types: Marijuana, Other-see comments   Comment: pt reports CBD use also    Social History   Socioeconomic History  . Marital status: Single    Spouse name: n/a  . Number of  children: 2  . Years of education: Not on file  . Highest education level: Associate degree: occupational, Hotel manager, or vocational program  Occupational History  . Not on file  Tobacco Use  . Smoking status: Current Every Day Smoker    Packs/day: 0.50    Types: Cigarettes  . Smokeless tobacco: Never Used  Vaping Use  . Vaping Use: Never used  Substance and Sexual Activity  . Alcohol use: No    Comment: occ  . Drug use: Yes    Types: Marijuana, Other-see comments    Comment: pt reports CBD use also  . Sexual activity: Yes    Birth control/protection: Implant  Other Topics Concern  . Not on file  Social History Narrative  . Not on file   Social Determinants of Health   Financial Resource Strain: Not on file  Food Insecurity: Not on file  Transportation Needs: Not on file  Physical Activity: Not on file  Stress: Not on file  Social Connections: Not on file   Sleep: Good  Appetite:  Good  Current Medications: Current Facility-Administered Medications  Medication Dose Route Frequency Provider Last Rate Last Admin  . acetaminophen (TYLENOL) tablet 650 mg  650 mg Oral Q6H PRN Prescilla Sours, PA-C   650 mg at 08/21/20 2057  . alum &  mag hydroxide-simeth (MAALOX/MYLANTA) 200-200-20 MG/5ML suspension 30 mL  30 mL Oral Q4H PRN Margorie John W, PA-C      . benztropine (COGENTIN) tablet 0.5 mg  0.5 mg Oral BID PRN Nelda Marseille, Khayman Kirsch E, MD      . buprenorphine-naloxone (SUBOXONE) 2-0.5 mg per SL tablet 1 tablet  1 tablet Sublingual TID WC & HS Sharma Covert, MD   1 tablet at 08/23/20 1133  . haloperidol (HALDOL) tablet 2.5 mg  2.5 mg Oral BID Viann Fish E, MD   2.5 mg at 08/23/20 4734   Or  . haloperidol lactate (HALDOL) injection 2.5 mg  2.5 mg Intramuscular BID Nelda Marseille, Lasharn Bufkin E, MD      . hydrOXYzine (ATARAX/VISTARIL) tablet 25 mg  25 mg Oral TID PRN Prescilla Sours, PA-C   25 mg at 08/22/20 2042  . LORazepam (ATIVAN) injection 1 mg  1 mg Intramuscular Q6H PRN Nelda Marseille, Valita Righter  E, MD      . LORazepam (ATIVAN) tablet 1 mg  1 mg Oral Q6H PRN Nelda Marseille, Kandise Riehle E, MD      . losartan (COZAAR) tablet 25 mg  25 mg Oral Daily Kyle, Tyrone A, DO   25 mg at 08/23/20 0833  . magnesium hydroxide (MILK OF MAGNESIA) suspension 30 mL  30 mL Oral Daily PRN Margorie John W, PA-C      . nicotine polacrilex (NICORETTE) gum 2 mg  2 mg Oral PRN Nwoko, Uchenna E, PA   2 mg at 08/21/20 1625  . ziprasidone (GEODON) injection 20 mg  20 mg Intramuscular Q12H PRN Harlow Asa, MD       And  . OLANZapine zydis (ZYPREXA) disintegrating tablet 10 mg  10 mg Oral Q8H PRN Nelda Marseille, Ayriel Texidor E, MD      . potassium chloride SA (KLOR-CON) CR tablet 20 mEq  20 mEq Oral BID Marylyn Ishihara, Tyrone A, DO   20 mEq at 08/23/20 0833  . risperiDONE (RISPERDAL M-TABS) disintegrating tablet 2 mg  2 mg Oral QHS Viann Fish E, MD   2 mg at 08/22/20 2041  . risperiDONE (RISPERDAL M-TABS) disintegrating tablet 2 mg  2 mg Oral QAC breakfast Harlow Asa, MD   2 mg at 08/23/20 0370  . traZODone (DESYREL) tablet 50 mg  50 mg Oral QHS PRN Prescilla Sours, PA-C   50 mg at 08/21/20 2057    Lab Results:  Results for orders placed or performed during the hospital encounter of 08/11/20 (from the past 48 hour(s))  ANA     Status: None   Collection Time: 08/22/20  6:42 AM  Result Value Ref Range   Anti Nuclear Antibody (ANA) Negative Negative    Comment: (NOTE) Performed At: [facility] [place], [place] [id number] Rush Farmer MD [id number]   Vitamin B12     Status: None   Collection Time: 08/22/20  6:42 AM  Result Value Ref Range   Vitamin B-12 555 180 - 914 pg/mL    Comment: (NOTE) This assay is not validated for testing neonatal or myeloproliferative syndrome specimens for Vitamin B12 levels. Performed at [facility], [place] [address], [place], [place] [address]   Ceruloplasmin     Status: None   Collection Time: 08/22/20  6:42 AM  Result Value Ref Range   Ceruloplasmin  33.2 19.0 - 39.0 mg/dL    Comment: (NOTE) Performed At: [facility] [place], [place] [id number] Rush Farmer MD [id number]   Comprehensive metabolic panel  Status: Abnormal   Collection Time: 08/22/20  6:42 AM  Result Value Ref Range   Sodium 134 (L) 135 - 145 mmol/L   Potassium 3.3 (L) 3.5 - 5.1 mmol/L   Chloride 97 (L) 98 - 111 mmol/L   CO2 28 22 - 32 mmol/L   Glucose, Bld 119 (H) 70 - 99 mg/dL    Comment: Glucose reference range applies only to samples taken after fasting for at least 8 hours.   BUN 12 6 - 20 mg/dL   Creatinine, Ser 0.66 0.44 - 1.00 mg/dL   Calcium 9.5 8.9 - 10.3 mg/dL   Total Protein 7.7 6.5 - 8.1 g/dL   Albumin 3.7 3.5 - 5.0 g/dL   AST 16 15 - 41 U/L   ALT 14 0 - 44 U/L   Alkaline Phosphatase 75 38 - 126 U/L   Total Bilirubin 0.7 0.3 - 1.2 mg/dL   GFR, Estimated >60 >60 mL/min    Comment: (NOTE) Calculated using the CKD-EPI Creatinine Equation (2021)    Anion gap 9 5 - 15    Comment: Performed at [facility], [place] [address] [place], [place] [address]  C-reactive protein     Status: None   Collection Time: 08/22/20  6:42 AM  Result Value Ref Range   CRP 0.7 <1.0 mg/dL    Comment: Performed at [facility], [place] [address], [place], [place] [address]  Sedimentation rate     Status: Abnormal   Collection Time: 08/22/20  6:42 AM  Result Value Ref Range   Sed Rate 28 (H) 0 - 22 mm/hr    Comment: Performed at [facility], [place] [address] [place], [place] [address]  HIV Antibody (routine testing w rflx)     Status: None   Collection Time: 08/22/20  6:42 AM  Result Value Ref Range   HIV Screen 4th Generation wRfx Non Reactive Non Reactive    Comment: Performed at [facility], [place] [address] [place], [place] [address]  RPR     Status: None   Collection Time: 08/22/20  6:42 AM  Result Value Ref Range   RPR Ser Ql NON REACTIVE NON REACTIVE    Comment:  Performed at [place] [address] [place], [place] [address]  Magnesium     Status: None   Collection Time: 08/22/20  6:42 AM  Result Value Ref Range   Magnesium 1.8 1.7 - 2.4 mg/dL    Comment: Performed at [facility], [place] [address], [place], [place] [address]  Magnesium     Status: None   Collection Time: 08/23/20  6:19 AM  Result Value Ref Range   Magnesium 1.8 1.7 - 2.4 mg/dL    Comment: Performed at [facility], [place] [address] [place], [place] [address]  Renal function panel     Status: Abnormal   Collection Time: 08/23/20  6:19 AM  Result Value Ref Range   Sodium 136 135 - 145 mmol/L   Potassium 4.1 3.5 - 5.1 mmol/L    Comment: DELTA CHECK NOTED NO VISIBLE HEMOLYSIS    Chloride 102 98 - 111 mmol/L   CO2 28 22 - 32 mmol/L   Glucose, Bld 106 (H) 70 - 99 mg/dL    Comment: Glucose reference range applies only to samples taken after fasting for at least 8 hours.   BUN 18 6 - 20 mg/dL   Creatinine, Ser 0.74 0.44 - 1.00 mg/dL  Calcium 9.2 8.9 - 10.3 mg/dL   Phosphorus 3.6 2.5 - 4.6 mg/dL   Albumin 3.5 3.5 - 5.0 g/dL   GFR, Estimated >60 >60 mL/min    Comment: (NOTE) Calculated using the CKD-EPI Creatinine Equation (2021)    Anion gap 6 5 - 15    Comment: Performed at [facility], [place] [address] [place], [place] [address]    Blood Alcohol level:  Lab Results  Component Value Date   ETH <10 02/06/5746    Metabolic Disorder Labs: Lab Results  Component Value Date   HGBA1C 5.4 08/19/2020   MPG 108.28 08/19/2020   No results found for: PROLACTIN Lab Results  Component Value Date   CHOL 169 08/19/2020   TRIG 62 08/19/2020   HDL 37 (L) 08/19/2020   CHOLHDL 4.6 08/19/2020   VLDL 12 08/19/2020   LDLCALC 120 (H) 08/19/2020    Physical Findings: AIMS: Facial and Oral Movements Muscles of Facial Expression: None, normal Lips and Perioral Area: None, normal Jaw: None, normal Tongue: None,  normal,Extremity Movements Upper (arms, wrists, hands, fingers): None, normal Lower (legs, knees, ankles, toes): None, normal, Trunk Movements Neck, shoulders, hips: None, normal, Overall Severity Severity of abnormal movements (highest score from questions above): None, normal Incapacitation due to abnormal movements: None, normal Patient's awareness of abnormal movements (rate only patient's report): No Awareness, Dental Status Current problems with teeth and/or dentures?: No Does patient usually wear dentures?: No       Musculoskeletal: Strength & Muscle Tone: within normal limits Gait & Station: steady, normal Patient leans: N/A  Psychiatric Specialty Exam: Physical Exam Vitals reviewed.  HENT:     Head: Normocephalic.  Pulmonary:     Effort: Pulmonary effort is normal.  Neurological:     Mental Status: She is alert.     Review of Systems  Respiratory: Negative for shortness of breath.   Cardiovascular: Negative for chest pain.  Gastrointestinal: Negative for diarrhea, nausea and vomiting.  Endocrine:       Galactorrhea  Neurological: Negative for headaches.    Blood pressure 127/72, pulse 85, temperature 98 F (36.7 C), temperature source Oral, resp. rate 16, height $RemoveBe'5\' 8"'[id number]$  (1.727 m), weight 124.7 kg, SpO2 100 %, unknown if currently breastfeeding.Body mass index is 41.81 kg/m.  General Appearance: casually dressed, good hygiene, appears stated age  Eye Contact:  Good  Speech:  Clear and Coherent and Normal Rate  Volume:  Normal  Mood:  Described as "good" - appears euthymic  Affect: moderate, stable, full - no longer guarded  Thought Process:   goal directed and linear  Orientation:  Oriented to self, situation, place and time  Thought Content:  Denies paranoia, AVH, ideas of reference, or first rank symptoms; does not appear to be grossly responding to internal/external stimuli on exam; no paranoia on exam  Suicidal Thoughts:  No  Homicidal Thoughts:  No  Memory:   Fair  Judgement:  Fair  Insight:  improving  Psychomotor Activity:  Normal  Concentration: Good  Recall: [place] of Knowledge:  Good  Language:  Good  Akathisia:  Negative  Assets:  Communication Skills Desire for Improvement Resilience  ADL's:  independent  Cognition:  WNL  Sleep:  Number of Hours: 6.75   Treatment Plan Summary: Diagnoses / Active Problems: Unspecified schizophrenia spectrum and other psychotic d/o (r/o substance induced psychotic d/o, r/o schizoaffective d/o, r/o schizophreniform d/o, r/o psychotic d/o secondary to a general medical condition) Opiate use d/o on MAT Cannabis  use episodic- r/o cannabis use d/o HTN Galactorrhea  PLAN: 1. Safety and Monitoring:  -- Involuntary admission to inpatient psychiatric unit for safety, stabilization and treatment  -- Daily contact with patient to assess and evaluate symptoms and progress in treatment  -- Patient's case to be discussed in multi-disciplinary team meeting  -- Observation Level : q15 minute checks  -- Vital signs:  q12 hours  -- Precautions: suicide  2. Psychiatric Diagnoses and Treatment:   Unspecified schizophrenia spectrum and other psychotic d/o (r/o substance induced psychotic d/o, r/o schizoaffective d/o, r/o schizophreniform d/o, r/o psychotic d/o secondary to a general medical condition) -- In context of reported galactorrhea, reduced Risperdal M tabs to 2mg  po bid and continued Haldol 2.5mg  bid - advised patient she will need to monitor as an outpatient over time for resolution of galactorrhea with dose reduction in medication - discussed potential transition to Abilify as an outpatient if galactorrhea persists - prolactin level pending  -- Medical w/u to r/o an organic etiology to her presentation (Noncontrast Head CT negative on 08/10/20; WBC 9.1, TSH 2.090, ETOH <10, UDS positive for THC; HIV nonreactive; ESR slightly elevated at 28; CRP 0.7, B12 555; RPR nonreactive, ceruloplasmin 33.2, ANA  negative)   -- Metabolic profile and EKG monitoring obtained while on an atypical antipsychotic (Lipid Panel:cholesterol 169, triglycerides 62, HDL 37, LDL 120; HbgA1c:5.4 QTc:411ms)   -- Continue Cogentin 0.5mg  bid PRN tremor/EPS  -- Agitation protocol (Ativan 1mg  po or IM q6 hours PRN agitation, Zyprexa zydis 10mg  po q8 hours PRN agitation, and Geodon 20mg  IM q12 hours PRN agitation)  -- Continue Trazodone 50mg  po qhs PRN insomnia  -- Continue Vistaril 25mg  tid PRN anxiety  -- Encouraged patient to participate in unit milieu and in scheduled group therapies   -- Short Term Goals: Ability to identify changes in lifestyle to reduce recurrence of condition will improve  -- Long Term Goals: Improvement in symptoms so as ready for discharge   Opiate use d/o on MAT  Cannabis use episodic- r/o Cannabis use d/o  -- Continue suboxone 2/0.5mg  tid and qhs - pharmacy confirmed that she can return to suboxone program after discharge and program was made aware of dose reduction made while inpatient  -- Short Term Goals: Ability to identify triggers associated with substance abuse/mental health issues will improve  -- Long Term Goals: Improvement in symptoms so as ready for discharge   3. Medical Issues Being Addressed:   HTN  -- Per IM consultant have started Losartan 25mg  daily and discontinued HCTZ (SBP 114- 120 and DBP 65-84)   Hyponatremia (Na+ 134) and Hypokalemia (K+ 3.4) -resolved  --  Per IM consultant have discontinue HCTZ and patient receiving K+ replacement bid for 2 days; Mag+ 1.8 and Na+ 136 and K+ 4.1 today with creatinine 0.74/BUN 18 - trending renal function panel per IM recommendations   Transient tachycardia  -- Repeating resting HR and EKG yesterday shows NSR 94bpm with QTc 449ms   Galactorrhea  -- Prolactin level pending  -- Reduced Risperdal to 2mg  bid and continued Haldol 2.5mg  bid - advised she will need to monitor over coming weeks for resolution of symptoms with dose  reduction in medication. If symptoms persist, discussed potential for continued gradual dose reduction in medicaiton and or transition to different atypical antipsychotic as an outpatient.  4. Discharge Planning:   -- Social work and case management to assist with discharge planning and identification of hospital follow-up needs prior to discharge  -- Estimated LOS:  1-2 days  -- Discharge Concerns: Need to establish a safety plan; Medication compliance and effectiveness  -- Discharge Goals: Return home with outpatient referrals for mental health follow-up including medication management/psychotherapy   Harlow Asa, MD, FAPA 08/23/2020, 3:30 PM

## 2020-08-23 NOTE — Progress Notes (Signed)
   08/23/20 1200  Psych Admission Type (Psych Patients Only)  Admission Status Involuntary  Psychosocial Assessment  Patient Complaints None  Eye Contact Suspiciousness  Facial Expression Flat  Affect Blunted  Speech Soft  Interaction Cautious;Guarded;Isolative  Motor Activity Fidgety  Appearance/Hygiene Improved  Behavior Characteristics Cooperative  Mood Pleasant  Thought Process  Coherency WDL  Content Paranoia  Delusions Paranoid  Perception WDL  Hallucination None reported or observed  Judgment Poor  Confusion None  Danger to Self  Current suicidal ideation? Denies  Danger to Others  Danger to Others None reported or observed  Dar Note: Patient is calm and appropriate on the unit.  Medications given as prescribed.  Safety checks maintained.  Denies suicidal thoughts, auditory and visual hallucinations.  Patient is safe on and off the unit.  Attended group and participated.

## 2020-08-23 NOTE — Progress Notes (Signed)
Recreation Therapy Notes  Date: 4.15.22 Time: 1000 Location: [address] Hall[address]  Group Topic: Communication, Team Building, Problem Solving  Goal Area(s) Addresses:  Patient will effectively work with peer towards shared goal.  Patient will identify skills used to make activity successful.  Patient will identify how skills used during activity can be used to reach post d/c goals.   Behavioral Response: Engaged, Attentive, Appropriate   Intervention: Teambuilding Activity  Activity: Lily Pad.  Patients were asked to use colored discs to get the entire team from one end of the hall way to the other. Patients were only allowed to move down and back the hallway by stepping on the discs, patient team was provided 1 additional disc to assist with them completing task.    Education: Pharmacist, community, Scientist, physiological, Discharge Planning   Education Outcome: Acknowledges education.   Clinical Observations/Feedback: Pt was very bright and was the lead in guiding the group through the first leg of the activity.  Pt was attentive to where she would throw the discs so it wouldn't be hard for peers to reach them.  Pt expressed enjoying the activity and wanting to try it with her kids.  Pt also agreed the group used teamwork and communication to complete activity.  Pt expressed the only thing she would do different is "try different techniques" to complete the activity.   Caroll Rancher, LRT/CTRS     Caroll Rancher A 08/23/2020 10:57 AM

## 2020-08-24 LAB — RENAL FUNCTION PANEL
Albumin: 3.5 g/dL (ref 3.5–5.0)
Anion gap: 8 (ref 5–15)
BUN: 12 mg/dL (ref 6–20)
CO2: 25 mmol/L (ref 22–32)
Calcium: 8.9 mg/dL (ref 8.9–10.3)
Chloride: 105 mmol/L (ref 98–111)
Creatinine, Ser: 0.61 mg/dL (ref 0.44–1.00)
GFR, Estimated: >60 mL/min (ref >60)
Glucose, Bld: 115 mg/dL — ABNORMAL HIGH (ref 70–99)
Phosphorus: 2.6 mg/dL (ref 2.5–4.6)
Potassium: 3.8 mmol/L (ref 3.5–5.1)
Sodium: 138 mmol/L (ref 135–145)

## 2020-08-24 LAB — PROLACTIN: Prolactin: 326 ng/mL — ABNORMAL HIGH (ref 4.8–23.3)

## 2020-08-24 MED ORDER — HALOPERIDOL 0.5 MG PO TABS: 2.5000 mg | ORAL_TABLET | Freq: Two times a day (BID) | ORAL | 0 refills | Status: DC

## 2020-08-24 MED ORDER — LOSARTAN POTASSIUM 25 MG PO TABS: 25.0000 mg | ORAL_TABLET | Freq: Every day | ORAL | 0 refills | Status: DC

## 2020-08-24 MED ORDER — RISPERIDONE 2 MG PO TBDP: 2.0000 mg | ORAL_TABLET | Freq: Every day | ORAL | 0 refills | Status: DC

## 2020-08-24 MED ORDER — TRAZODONE HCL 50 MG PO TABS: 50.0000 mg | ORAL_TABLET | Freq: Every evening | ORAL | 0 refills | Status: DC | PRN

## 2020-08-24 MED ORDER — HYDROXYZINE HCL 25 MG PO TABS: 25.0000 mg | ORAL_TABLET | Freq: Three times a day (TID) | ORAL | 0 refills | Status: DC | PRN

## 2020-08-24 MED ORDER — BENZTROPINE MESYLATE 0.5 MG PO TABS: 0.5000 mg | ORAL_TABLET | Freq: Two times a day (BID) | ORAL | 0 refills | Status: DC | PRN

## 2020-08-24 NOTE — Progress Notes (Signed)
Pt up a little anxious and a little agitated she can't fall back to sleep. Pt given PRN Ativan per [facility]

## 2020-08-24 NOTE — Progress Notes (Addendum)
  [facility] Adult Case Management Discharge Plan :  Will you be returning to the same living situation after discharge:  Yes,  with mother At discharge, do you have transportation home?: Yes,  niece at 12:00pm Do you have the ability to pay for your medications: Yes,  Medicaid  Release of information consent forms completed and emailed to Medical Records, then turned in to Medical Records by CSW.   Patient to Follow up at:  Follow-up Information    [facility]. Go on 08/26/2020.   Specialty: Behavioral Health Why: You have a walk in appointment on 08/26/20 at 7:45 am for therapy services.  You also have an appointment on 09/12/20 at 7:45 am for medication management services.  Walk in appointments are first come, first served and are held in person.  Contact information: [address] [place] [address] [phone]       [facility]. Go to.   Why: you have an appointment on Monday, 08/26/2020 at 9am for suboxone.  Contact information: [address]  [place], [place] [address]  Telephone: [phone] Fax:        [facility] Follow up.   Why: Please go to this provider's office to fill out forms to re-establish care with this provider for primary care services.  Phone number [phone] Contact information: [address] [place] [address] [phone]               [facility]. Schedule an appointment as soon as possible for a visit.   Specialty: Internal Medicine Why: Please call this office and make an appointment with an endocrinologist to address your elevated Prolactin level. Contact information: [address], [address] [place] [address] [phone]          Next level of care provider has access to [facility] Link:no  Safety Planning and Suicide Prevention discussed: Yes,  with mother  Have you used any form of tobacco in the last 30 days?  (Cigarettes, Smokeless Tobacco, Cigars, and/or Pipes): Yes  Has patient been referred to the Quitline?: Patient refused referral  Patient has been referred for addiction treatment: Yes  Lynnell Chad, LCSW 08/24/2020, 10:39 AM

## 2020-08-24 NOTE — Progress Notes (Signed)
   08/24/20 0830  Psych Admission Type (Psych Patients Only)  Admission Status Involuntary  Psychosocial Assessment  Patient Complaints None  Eye Contact Fair  Facial Expression Animated  Affect Appropriate to circumstance  Speech Logical/coherent  Interaction Cautious  Motor Activity Pacing  Appearance/Hygiene Improved  Behavior Characteristics Cooperative;Appropriate to situation  Mood Pleasant  Thought Process  Coherency WDL  Content Paranoia  Delusions Paranoid  Perception WDL  Hallucination None reported or observed  Judgment Poor  Confusion None  Danger to Self  Current suicidal ideation? Denies  Danger to Others  Danger to Others None reported or observed  Susan English was up and visible on the unit.  She has been interacting well with staff and peers.  She completed her self inventory and reported her anxiety, hopelessness and depression are all 1/10 (10 the worst).  She stated her goal for today was "get home before Easter." Q 15 minute checks maintained for safety.  We will continue to monitor the progress towards her goals.

## 2020-08-24 NOTE — Progress Notes (Signed)
   08/24/20 0500  Sleep  Number of Hours 7.5
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
## 2020-08-24 NOTE — Progress Notes (Signed)
Patient educated about follow up care and upcoming appointments. Patient verbalizes understanding of all follow up appointments. AVS and suicide safety plan reviewed. Patient expresses no concerns or questions at this time. Educated on prescriptions and medication regimen. Patient belongings returned from locker #41. Patient denies SI, HI, AVH at this time. Educated patient about suicide help resources and hotline, encouraged to call for assistance in the event of a crisis. Patient agrees. Patient is ambulatory and safe at time of discharge. Patient discharged to hospital lobby at this time.
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
## 2020-09-03 ENCOUNTER — Ambulatory Visit (INDEPENDENT_AMBULATORY_CARE_PROVIDER_SITE_OTHER): Payer: Medicaid Other | Admitting: Psychiatry

## 2020-09-03 ENCOUNTER — Encounter (HOSPITAL_COMMUNITY): Payer: Self-pay | Admitting: Psychiatry

## 2020-09-03 ENCOUNTER — Other Ambulatory Visit: Payer: Self-pay

## 2020-09-03 VITALS — BP 136/89 | HR 100 | Ht 68.0 in | Wt 284.0 lb

## 2020-09-03 DIAGNOSIS — F29 Unspecified psychosis not due to a substance or known physiological condition: Secondary | ICD-10-CM

## 2020-09-03 DIAGNOSIS — F411 Generalized anxiety disorder: Secondary | ICD-10-CM | POA: Diagnosis not present

## 2020-09-03 MED ORDER — HYDROXYZINE HCL 25 MG PO TABS
25.0000 mg | ORAL_TABLET | Freq: Three times a day (TID) | ORAL | 2 refills | Status: DC | PRN
Start: 2020-09-03 — End: 2020-09-19

## 2020-09-03 MED ORDER — ARIPIPRAZOLE 10 MG PO TABS: 10.0000 mg | ORAL_TABLET | Freq: Every day | ORAL | 2 refills | Status: DC

## 2020-09-03 MED ORDER — TRAZODONE HCL 100 MG PO TABS: 100.0000 mg | ORAL_TABLET | Freq: Every evening | ORAL | 2 refills | Status: DC | PRN

## 2020-09-03 MED ORDER — BENZTROPINE MESYLATE 0.5 MG PO TABS
0.5000 mg | ORAL_TABLET | Freq: Two times a day (BID) | ORAL | 2 refills | Status: DC | PRN
Start: 2020-09-03 — End: 2020-09-19
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
## 2020-09-09 ENCOUNTER — Other Ambulatory Visit: Payer: Self-pay

## 2020-09-09 ENCOUNTER — Ambulatory Visit (INDEPENDENT_AMBULATORY_CARE_PROVIDER_SITE_OTHER): Payer: Medicaid Other | Admitting: Licensed Clinical Social Worker

## 2020-09-09 ENCOUNTER — Encounter (HOSPITAL_COMMUNITY): Payer: Self-pay | Admitting: Licensed Clinical Social Worker

## 2020-09-09 DIAGNOSIS — F29 Unspecified psychosis not due to a substance or known physiological condition: Secondary | ICD-10-CM | POA: Diagnosis not present
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
## 2020-09-13 ENCOUNTER — Other Ambulatory Visit: Payer: Self-pay

## 2020-09-13 ENCOUNTER — Ambulatory Visit: Payer: Medicaid Other | Admitting: Family Medicine

## 2020-09-13 ENCOUNTER — Encounter: Payer: Self-pay | Admitting: Family Medicine

## 2020-09-13 VITALS — BP 122/76 | HR 88 | Ht 68.0 in | Wt 283.2 lb

## 2020-09-13 DIAGNOSIS — L308 Other specified dermatitis: Secondary | ICD-10-CM | POA: Diagnosis not present

## 2020-09-13 DIAGNOSIS — Z0000 Encounter for general adult medical examination without abnormal findings: Secondary | ICD-10-CM | POA: Diagnosis present

## 2020-09-13 DIAGNOSIS — K5909 Other constipation: Secondary | ICD-10-CM

## 2020-09-13 MED ORDER — AQUAPHOR EX OINT: TOPICAL_OINTMENT | CUTANEOUS | 0 refills | Status: DC | PRN

## 2020-09-13 MED ORDER — POLYETHYLENE GLYCOL 3350 17 GM/SCOOP PO POWD: 17.0000 g | Freq: Two times a day (BID) | ORAL | 1 refills | Status: DC | PRN
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
## 2020-09-13 NOTE — Patient Instructions (Signed)
I do not think we need to get labs today as you have had labs drawn recently which have been normal, including your thyroid test. I am sending in a prescription to help with your constipation, you can take this up to 2 times a day as needed. I am also sending in an ointment to help with your eczema, I recommend using it liberally and at least 1-2 times daily. If you begin to have a flare let me know and we can evaluate for further treatment.

## 2020-09-19 ENCOUNTER — Encounter (HOSPITAL_COMMUNITY): Payer: Self-pay | Admitting: Psychiatry

## 2020-09-19 ENCOUNTER — Other Ambulatory Visit: Payer: Self-pay

## 2020-09-19 ENCOUNTER — Ambulatory Visit (INDEPENDENT_AMBULATORY_CARE_PROVIDER_SITE_OTHER): Payer: Medicaid Other | Admitting: Psychiatry

## 2020-09-19 DIAGNOSIS — F411 Generalized anxiety disorder: Secondary | ICD-10-CM

## 2020-09-19 DIAGNOSIS — F29 Unspecified psychosis not due to a substance or known physiological condition: Secondary | ICD-10-CM

## 2020-09-19 MED ORDER — TRAZODONE HCL 100 MG PO TABS: 100.0000 mg | ORAL_TABLET | Freq: Every evening | ORAL | 2 refills | Status: DC | PRN

## 2020-09-19 MED ORDER — FLUOXETINE HCL 10 MG PO CAPS: 10.0000 mg | ORAL_CAPSULE | Freq: Every day | ORAL | 2 refills | Status: DC

## 2020-09-19 MED ORDER — BENZTROPINE MESYLATE 0.5 MG PO TABS: 0.5000 mg | ORAL_TABLET | Freq: Two times a day (BID) | ORAL | 2 refills | Status: DC | PRN

## 2020-09-19 MED ORDER — ARIPIPRAZOLE 20 MG PO TABS: 20.0000 mg | ORAL_TABLET | Freq: Every day | ORAL | 2 refills | Status: DC

## 2020-09-19 MED ORDER — HYDROXYZINE HCL 25 MG PO TABS: 25.0000 mg | ORAL_TABLET | Freq: Three times a day (TID) | ORAL | 2 refills | Status: DC | PRN

## 2020-09-19 NOTE — Progress Notes (Signed)
BH MD/PA/NP OP Progress Note  09/19/2020 10:04 AM Susan English  MRN:  [id number]  Chief Complaint: " I am not doing well I cannot control my mood"  Chief Complaint    Medication Management     HPI: 30 year old female seen today for follow up psychiatric evaluation.   She has a psychiatric history of opioid dependence (in remission), marijuana use (in remission), and schizophrenia spectrum disorder.  Currently she is managed on trazodone 100 mg nightly, hydroxyzine 25 mg 3 times daily, Suboxone 1.5 mg 4 times daily (received from Suboxone clinic), Cogentin 0.5 mg twice daily, and Abilify 10 mg daily.  Patient  notes her medications are somewhat effective in managing her psychiatric conditions.  Today she is well-groomed, restless, engaged in conversation, and maintained fair eye contact.  Her affect was flat and her speech was clear and coherent.  She informed Clinical research associate that currently she is not doing well because she is unable to control her mood.  She informed provider that she becomes irritable and distractible.  She also endorses ideas of reference noting that she feels that the TV speaks to her and often feels paranoid that people are against her.  She was seen with her mother who notes that she believes that people are talking about her in public all the time.  She notes that her daughter will leave stores frustrated because she believes that others are against her.  The patient reports that she feels like the devil is against her workings to others.  Patient informed provider that when she was went to pick up her medications from the pharmacy they gave her old prescriptions.  She notes she instructed the pharmacy that she did not need the medications but they still made her paid for it.  Provider called [facility] to cancel Risperdal, Haldol, and clonidine.  Patient notes that she was also given gabapentin.  Gabapentin was not filled by provider at last visit or [facility].  Provider informed patient  that Dr. Doyne Keel name was on the prescription and recommended that she follow-up with that doctor.  She notes that this is the doctor that performed provides her Suboxone.  Patient notes that above stressors makes her more anxious and depressed.  Provider conducted a GAD-7 and patient scored a 21, at her last visit she scored a 20.  Provider also conducted a PHQ-9 and patient scored an 18, at her last visit she scored a 22.  She notes that since starting trazodone her sleep has been better.  Today she denies SI/HI/VAH.  At patient last visit she complained of restlessness and leakage of milk from her breast.  Today she denies the symptoms.  She is agreeable to increasing Abilify 10 mg to 20 mg to help manage mood and symptoms of paranoia.  She will also start Prozac 10 mg to help manage anxiety and depression. Potential side effects of medication and risks vs benefits of treatment vs non-treatment were explained and discussed. All questions were answered.  She will follow-up with outpatient counseling for therapy.  No other concerns noted at this time.  Visit Diagnosis:    ICD-10-CM   1. Schizophrenia spectrum disorder with psychotic disorder type not yet determined (HCC)  F29 traZODone (DESYREL) 100 MG tablet    benztropine (COGENTIN) 0.5 MG tablet    ARIPiprazole (ABILIFY) 20 MG tablet  2. Generalized anxiety disorder  F41.1 FLUoxetine (PROZAC) 10 MG capsule    hydrOXYzine (ATARAX/VISTARIL) 25 MG tablet    Past Psychiatric History:  opioid dependence (in remission), marijuana use (in remission), and schizophrenia spectrum disorder  Past Medical History:  Past Medical History:  Diagnosis Date  . Headache(784.0)   . 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 Hypertension   . Marijuana abuse 08/20/2020  . Obesity     Past Surgical History:  Procedure Laterality Date  . CESAREAN SECTION    . ORIF ANKLE FRACTURE Right 12/22/2012   Procedure: OPEN REDUCTION  INTERNAL FIXATION (ORIF) ANKLE FRACTURE;  Surgeon: Mable Paris, MD;  Location: [facility];  Service: Orthopedics;  Laterality: Right;  . sweat gland      Family Psychiatric History: grand aunt noted to have mental health conditions however she is unaware of which ones.  Paternal family members has mental health conditions however is unsure of which ones  Family History:  Family History  Problem Relation Age of Onset  . Hypertension Mother   . Diabetes Mother   . Diabetes Maternal Grandmother   . Breast cancer Paternal Grandmother     Social History:  Social History   Socioeconomic History  . Marital status: Single    Spouse name: n/a  . Number of children: 2  . Years of education: Not on file  . Highest education level: Associate degree: occupational, Scientist, product/process development, or vocational program  Occupational History  . Not on file  Tobacco Use  . Smoking status: Current Every Day Smoker    Packs/day: 0.50    Types: Cigarettes  . Smokeless tobacco: Never Used  Vaping Use  . Vaping Use: Never used  Substance and Sexual Activity  . Alcohol use: No  . Drug use: Not Currently  . Sexual activity: Not Currently    Birth control/protection: Implant  Other Topics Concern  . Not on file  Social History Narrative  . Not on file   Social Determinants of Health   Financial Resource Strain: Medium Risk  . Difficulty of Paying Living Expenses: Somewhat hard  Food Insecurity: No Food Insecurity  . Worried About Programme researcher, broadcasting/film/video in the Last Year: Never true  . Ran Out of Food in the Last Year: Never true  Transportation Needs: No Transportation Needs  . Lack of Transportation (Medical): No  . Lack of Transportation (Non-Medical): No  Physical Activity: Insufficiently Active  . Days of Exercise per Week: 2 days  . Minutes of Exercise per Session: 30 min  Stress: Stress Concern Present  . Feeling of Stress : Rather much  Social Connections: Moderately  Isolated  . Frequency of Communication with Friends and Family: Twice a week  . Frequency of Social Gatherings with Friends and Family: Once a week  . Attends Religious Services: More than 4 times per year  . Active Member of Clubs or Organizations: No  . Attends Banker Meetings: Never  . Marital Status: Never married    Allergies:  Allergies  Allergen Reactions  . Latex Hives    Metabolic Disorder Labs: Lab Results  Component Value Date   HGBA1C 5.4 08/19/2020   MPG 108.28 08/19/2020   Lab Results  Component Value Date   PROLACTIN 326.0 (H) 08/23/2020   Lab Results  Component Value Date   CHOL 169 08/19/2020   TRIG 62 08/19/2020   HDL 37 (L) 08/19/2020   CHOLHDL 4.6 08/19/2020   VLDL 12 08/19/2020   LDLCALC 120 (H) 08/19/2020   Lab Results  Component Value Date   TSH 2.090 08/15/2020  Therapeutic Level Labs: No results found for: LITHIUM No results found for: VALPROATE No components found for:  CBMZ  Current Medications: Current Outpatient Medications  Medication Sig Dispense Refill  . FLUoxetine (PROZAC) 10 MG capsule Take 1 capsule (10 mg total) by mouth daily. 30 capsule 2  . losartan (COZAAR) 25 MG tablet Take 1 tablet (25 mg total) by mouth daily. 30 tablet 0  . mineral oil-hydrophilic petrolatum (AQUAPHOR) ointment Apply topically as needed for dry skin. 420 g 0  . polyethylene glycol powder (GLYCOLAX/MIRALAX) 17 GM/SCOOP powder Take 17 g by mouth 2 (two) times daily as needed. 3350 g 1  . SUBOXONE 8-2 MG FILM Place 1.5 Film under the tongue 4 (four) times daily.    . ARIPiprazole (ABILIFY) 20 MG tablet Take 1 tablet (20 mg total) by mouth daily. 30 tablet 2  . benztropine (COGENTIN) 0.5 MG tablet Take 1 tablet (0.5 mg total) by mouth 2 (two) times daily as needed for tremors (EPS). 30 tablet 2  . hydrOXYzine (ATARAX/VISTARIL) 25 MG tablet Take 1 tablet (25 mg total) by mouth 3 (three) times daily as needed for anxiety. 90 tablet 2  .  traZODone (DESYREL) 100 MG tablet Take 1 tablet (100 mg total) by mouth at bedtime as needed for sleep. 30 tablet 2   No current facility-administered medications for this visit.     Musculoskeletal: Strength & Muscle Tone: within normal limits Gait & Station: normal Patient leans: N/A  Psychiatric Specialty Exam: Review of Systems  Blood pressure 127/80, pulse 80, height 5\' 8"  (1.727 m), weight 283 lb (128.4 kg), unknown if currently breastfeeding.Body mass index is 43.03 kg/m.  General Appearance: Well Groomed  Eye Contact:  Good  Speech:  Clear and Coherent and Normal Rate  Volume:  Normal  Mood:  Anxious and Depressed  Affect:  Appropriate and Congruent  Thought Process:  Coherent, Goal Directed and Linear  Orientation:  Full (Time, Place, and Person)  Thought Content: Logical, Ideas of Reference:   Paranoia and Paranoid Ideation   Suicidal Thoughts:  No  Homicidal Thoughts:  No  Memory:  Immediate;   Good Recent;   Good Remote;   Good  Judgement:  Fair  Insight:  Fair  Psychomotor Activity:  Normal  Concentration:  Concentration: Good and Attention Span: Good  Recall:  Good  Fund of Knowledge: Good  Language: Good  Akathisia:  No  Handed:  Right  AIMS (if indicated): Not done  Assets:  Communication Skills Desire for Improvement Financial Resources/Insurance Housing Leisure Time Social Support  ADL's:  Intact  Cognition: WNL  Sleep:  Good   Screenings: AIMS   Flowsheet Row Office Visit from 09/03/2020 in [facility]  AIMS Total Score 0 0    GAD-7   Flowsheet Row Clinical Support from 09/19/2020 in [facility] Office Visit from 09/03/2020 in [facility] Routine Prenatal from 12/15/2017 in Center for [facility] Routine Prenatal from 11/29/2017 in Center for [facility] from 11/05/2017 in Center for [facility]  Total GAD-7 Score 21 20 2 7 3     PHQ2-9   Flowsheet Row Clinical Support from 09/19/2020 in [facility] Counselor from 09/09/2020 in [facility] Office Visit from 09/03/2020 in [facility] Routine Prenatal from 12/15/2017 in Center for [facility] Routine  [facility]  PHQ-2 Total Score 4 5 6 1 2   PHQ-9 Total Score 18 18 22 7 11     Flowsheet [facility]  C-SSRS RISK CATEGORY Error: Q7 should not be populated when Q6 is No No Risk Error: Q7 should not be populated when Q6 is No       Assessment and Plan: Patient endorses symptoms of anxiety, depression, and paranoia.  Today she is agreeable to increasing Abilify 10 mg to 20 mg to help manage mood and symptoms of psychosis.  She is also agreeable to starting Prozac 10 mg to help manage anxiety and depression.  She will continue all other medication as prescribed.  1. Schizophrenia spectrum disorder with psychotic disorder type not yet determined (HCC)  Continue- traZODone (DESYREL) 100 MG tablet; Take 1 tablet (100 mg total) by mouth at bedtime as needed for sleep.  Dispense: 30 tablet; Refill: 2 Continue- benztropine (COGENTIN) 0.5 MG tablet; Take 1 tablet (0.5 mg total) by mouth 2 (two) times daily as needed for tremors (EPS).  Dispense: 30 tablet; Refill: 2 Increase- ARIPiprazole (ABILIFY) 20 MG tablet; Take 1 tablet (20 mg total) by mouth daily.  Dispense: 30 tablet; Refill: 2  2. Generalized anxiety disorder  Start- FLUoxetine (PROZAC) 10 MG capsule; Take 1 capsule (10 mg  total) by mouth daily.  Dispense: 30 capsule; Refill: 2 Continue- hydrOXYzine (ATARAX/VISTARIL) 25 MG tablet; Take 1 tablet (25 mg total) by mouth 3 (three) times daily as needed for anxiety.  Dispense: 90 tablet; Refill: 2  Follow-up in 2 months   Shanna CiscoBrittney E Agueda Houpt, NP 09/19/2020, 10:04 AM

## 2020-10-04 ENCOUNTER — Telehealth (HOSPITAL_COMMUNITY): Payer: Self-pay | Admitting: *Deleted

## 2020-10-04 NOTE — Telephone Encounter (Signed)
Call from patients mom, she inadvertently shredded her discharge paperwork from her recent hospitalization at [facility] and needs it to complete paperwork for her job. Called her back to explain I couldn't release it because we didn't generate it, to get it off my chart or to call the hospital and ask them for it.

## 2020-10-08 ENCOUNTER — Other Ambulatory Visit (HOSPITAL_COMMUNITY)
Admission: RE | Admit: 2020-10-08 | Discharge: 2020-10-08 | Disposition: A | Discharge status: Home / Self Care | Payer: Medicaid Other | Source: Ambulatory Visit | Attending: Family Medicine | Admitting: Family Medicine

## 2020-10-08 ENCOUNTER — Ambulatory Visit: Payer: Medicaid Other | Admitting: Family Medicine

## 2020-10-08 ENCOUNTER — Encounter: Payer: Self-pay | Admitting: Family Medicine

## 2020-10-08 ENCOUNTER — Other Ambulatory Visit: Payer: Self-pay

## 2020-10-08 VITALS — BP 121/75 | HR 69 | Ht 68.0 in | Wt 294.6 lb

## 2020-10-08 DIAGNOSIS — N760 Acute vaginitis: Secondary | ICD-10-CM

## 2020-10-08 DIAGNOSIS — B9689 Other specified bacterial agents as the cause of diseases classified elsewhere: Secondary | ICD-10-CM | POA: Diagnosis not present

## 2020-10-08 DIAGNOSIS — I10 Essential (primary) hypertension: Secondary | ICD-10-CM

## 2020-10-08 DIAGNOSIS — N926 Irregular menstruation, unspecified: Secondary | ICD-10-CM | POA: Insufficient documentation

## 2020-10-08 DIAGNOSIS — F29 Unspecified psychosis not due to a substance or known physiological condition: Secondary | ICD-10-CM | POA: Diagnosis not present

## 2020-10-08 LAB — POCT WET PREP (WET MOUNT)
Clue Cells Wet Prep Whiff POC: POSITIVE
Trichomonas Wet Prep HPF POC: ABSENT

## 2020-10-08 LAB — POCT URINE PREGNANCY: Preg Test, Ur: NEGATIVE

## 2020-10-08 MED ORDER — LOSARTAN POTASSIUM 25 MG PO TABS: 25.0000 mg | ORAL_TABLET | Freq: Every day | ORAL | 0 refills | Status: DC

## 2020-10-08 MED ORDER — METRONIDAZOLE 0.75 % VA GEL: 1.0000 | Freq: Every day | VAGINAL | 0 refills | Status: DC

## 2020-10-08 NOTE — Assessment & Plan Note (Addendum)
Likely related to Abilify use.  Patient encouraged to track menstrual cycles and return in the next few weeks if concerned and no menstrual cycle present.  Patient recently had a prolactin level drawn that was elevated and had galactorrhea at that time.  Indicating likely side effects to psychiatric medications that could affect menstruation. -Pregnancy test negative

## 2020-10-08 NOTE — Patient Instructions (Addendum)
We are checking for a vaginal infection and will let you know if there is any infection that we need to treat.   I am refilling your Losartan and want you to stop taking the clonidine as your psychiatrist mentioned to you.   We will check a pregnancy test just to make sure since you missed your period. Your medications can be causing some issues with your period, if it doesn't occur in the next 3-4 weeks then come back and we will talk more about it.

## 2020-10-08 NOTE — Assessment & Plan Note (Signed)
Patient medications currently include Abilify, benztropine, fluoxetine, trazodone.  Patient is unsure of effectiveness and feels that they are contributing to her fatigue.  Patient is still seeing psychiatry for medication management and counseling with her next counseling appointment on 6/16. - Patient encouraged to continue with counseling - Continue current medications

## 2020-10-08 NOTE — Progress Notes (Signed)
    SUBJECTIVE:   CHIEF COMPLAINT / HPI:   Difficulty focusing Patient reports that she has been having difficulty with focusing when playing with her children for the last months to few years.  She states that she loses interest in things after couple of minutes.  Abnormal menstrual cycles Patient ports she has not had a menstrual cycle since she was discharged to the hospital on 08/11/2020.  She thinks that it could be related to her current medications.  She previously was on an antipsychotic that caused her to have galactorrhea and was switched to Abilify.  Her prior periods were every 21 to 28 days, had cramping present but were not excessively heavy.  HTN  Patient reports that she needs her losartan refilled.  She has been out for the last week and has been taking clonidine instead because it would help her blood pressure.  Vaginal discharge Patient reports vaginal discharge and odor that she cannot describe for quite a while.  Reports that her last sexual intercourse was in 2021.  PERTINENT  PMH / PSH: Reviewed  OBJECTIVE:   BP 121/75   Pulse 69   Ht 5\' 8"  (1.727 m)   Wt 294 lb 9.6 oz (133.6 kg)   LMP 08/23/2020   SpO2 97%   BMI 44.79 kg/m   Gen: well-appearing, NAD CV: RRR, no m/r/g appreciated, no peripheral edema Pulm: CTAB, no wheezes/crackles GI: soft, non-tender, non-distended Pelvic: normal external female genitalia without lesions, mild white discharge noted in vaginal canal, cervix non-friable with mucous present. Chaperoned by CMA 08/25/2020  ASSESSMENT/PLAN:   Hypertension BP at goal at 121/75. -Refill losartan 25 mg daily  Schizophrenia spectrum disorder with psychotic disorder type not yet determined [facility]) Patient medications currently include Abilify, benztropine, fluoxetine, trazodone.  Patient is unsure of effectiveness and feels that they are contributing to her fatigue.  Patient is still seeing psychiatry for medication management and counseling  with her next counseling appointment on 6/16. - Patient encouraged to continue with counseling - Continue current medications  Abnormal menstrual cycle Likely related to Abilify use.  Patient encouraged to track menstrual cycles and return in the next few weeks if concerned and no menstrual cycle present.  Patient recently had a prolactin level drawn that was elevated and had galactorrhea at that time.  Indicating likely side effects to psychiatric medications that could affect menstruation.   Vaginal odor and discharge Wet mount completed today clue cells and positive whiff test.  - Metronidazole vaginal get QHS x5 days.   Healthcare maintenance Pap smear completed today   7/16, DO [facility]

## 2020-10-08 NOTE — Assessment & Plan Note (Signed)
BP at goal at 121/75. -Refill losartan 25 mg daily

## 2020-10-18 LAB — CYTOLOGY - PAP
Comment: NEGATIVE
Diagnosis: HIGH — AB
High risk HPV: POSITIVE — AB

## 2020-10-19 ENCOUNTER — Telehealth: Payer: Self-pay | Admitting: Family Medicine

## 2020-10-19 DIAGNOSIS — R87613 High grade squamous intraepithelial lesion on cytologic smear of cervix (HGSIL): Secondary | ICD-10-CM

## 2020-10-19 NOTE — Telephone Encounter (Signed)
Call patient regarding Pap smear results which showed high risk HPV positive and HSIL.  Patient was made aware that we will have to make referral to OB/GYN for colposcopy.  Patient will likely need LEEP procedure if not desiring any more children versus cryo.  Pap smear result history as below.  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

## 2020-10-22 ENCOUNTER — Ambulatory Visit (HOSPITAL_COMMUNITY): Payer: Medicaid Other | Admitting: Licensed Clinical Social Worker

## 2020-11-05 ENCOUNTER — Other Ambulatory Visit: Payer: Self-pay | Admitting: Family Medicine

## 2020-11-05 DIAGNOSIS — I10 Essential (primary) hypertension: Secondary | ICD-10-CM

## 2020-11-07 ENCOUNTER — Ambulatory Visit: Payer: Medicaid Other | Admitting: Family Medicine

## 2020-11-13 ENCOUNTER — Ambulatory Visit (HOSPITAL_COMMUNITY): Payer: Medicaid Other | Admitting: Licensed Clinical Social Worker

## 2020-11-19 ENCOUNTER — Encounter (HOSPITAL_COMMUNITY): Payer: Self-pay | Admitting: Psychiatry

## 2020-12-04 ENCOUNTER — Telehealth (HOSPITAL_COMMUNITY): Payer: Self-pay | Admitting: *Deleted

## 2020-12-04 NOTE — Telephone Encounter (Signed)
Notified by [facility] she has been approved for her Abilify for one year, till 12/04/21.Will notify the pharmacy.

## 2021-02-03 ENCOUNTER — Telehealth (INDEPENDENT_AMBULATORY_CARE_PROVIDER_SITE_OTHER): Payer: Medicaid Other | Admitting: Psychiatry

## 2021-02-03 ENCOUNTER — Other Ambulatory Visit: Payer: Self-pay

## 2021-02-03 ENCOUNTER — Encounter (HOSPITAL_COMMUNITY): Payer: Self-pay | Admitting: Psychiatry

## 2021-02-03 DIAGNOSIS — F29 Unspecified psychosis not due to a substance or known physiological condition: Secondary | ICD-10-CM | POA: Diagnosis not present

## 2021-02-03 DIAGNOSIS — F411 Generalized anxiety disorder: Secondary | ICD-10-CM

## 2021-02-03 MED ORDER — ARIPIPRAZOLE 20 MG PO TABS: 20.0000 mg | ORAL_TABLET | Freq: Every day | ORAL | 3 refills | Status: DC

## 2021-02-03 MED ORDER — GABAPENTIN 300 MG PO CAPS: 300.0000 mg | ORAL_CAPSULE | Freq: Three times a day (TID) | ORAL | 3 refills | Status: DC

## 2021-02-03 MED ORDER — TRAZODONE HCL 100 MG PO TABS: 100.0000 mg | ORAL_TABLET | Freq: Every evening | ORAL | 3 refills | Status: DC | PRN

## 2021-02-03 MED ORDER — FLUOXETINE HCL 20 MG PO CAPS
20.0000 mg | ORAL_CAPSULE | Freq: Every day | ORAL | 3 refills | Status: DC
Start: 2021-02-03 — End: 2021-05-07

## 2021-02-03 MED ORDER — BENZTROPINE MESYLATE 0.5 MG PO TABS: 0.5000 mg | ORAL_TABLET | Freq: Two times a day (BID) | ORAL | 3 refills | Status: DC | PRN

## 2021-02-03 NOTE — Progress Notes (Signed)
BH MD/PA/NP OP Progress Note Virtual Visit via Video Note  I connected with Susan English on 02/03/21 at  3:30 PM EDT by a video enabled telemedicine application and verified that I am speaking with the correct person using two identifiers.  Location: Patient: Home Provider: Clinic   I discussed the limitations of evaluation and management by telemedicine and the availability of in person appointments. The patient expressed understanding and agreed to proceed.  I provided 30 minutes of non-face-to-face time during this encounter.   02/03/2021 3:51 PM Susan English  MRN:  [id number]  Chief Complaint: " I have been tired"    HPI: 30 year old female seen today for follow up psychiatric evaluation.   She has a psychiatric history of opioid dependence (in remission), marijuana use (in remission), and schizophrenia spectrum disorder.  Currently she is managed on trazodone 100 mg nightly, hydroxyzine 25 mg 3 times daily, Suboxone 1.5 mg 4 times daily (received from [facility] clinic), Cogentin 0.5 mg twice daily, Prozac 10 mg daily, and Abilify 20 mg daily.  Patient  notes her medications are somewhat effective in managing her psychiatric conditions.   Today she is well-groomed, engaged in conversation, and maintained fair eye contact.  She informed Clinical research associate that recently she has been more tired and lack motivation to do things she once enjoyed. She notes that most days she lays in bed but notes that her sleep has been poor. She informed Clinical research associate that she has been sleeping 2-3 hours nightly. She also notes that she has been more irritable. She reports that she has little patience for her daughter. Since increasing Abilify she notes that she is no longer paranoid. She endorses passive Si but denies wanting to harm herself today. Today she denies SI/HI/VAH, or mania.  Patient notes that  her anxiety and depression continues to be bothersome. Provider conducted a GAD-7 and patient scored a 20, at her last  visit she scored a 21.  Provider also conducted a PHQ-9 and patient scored an 37, at her last visit she scored a 18.  She notes her appetite has increased and reports gaining 20 pounds.  Today she is agreeable to increasing Prozac 10 mg to 20 mg to help manage anxiety and depression. She is also agreeable to restarting gabapentin 300 mg three times daily to help mood, anxiety, and sleep. Trazodone not increased at this time but may be at next visit.Potential side effects of medication and risks vs benefits of treatment vs non-treatment were explained and discussed. All questions were answered.  She will follow-up with outpatient counseling for therapy.  No other concerns noted at this time.  Visit Diagnosis:    ICD-10-CM   1. Schizophrenia spectrum disorder with psychotic disorder type not yet determined (HCC)  F29 ARIPiprazole (ABILIFY) 20 MG tablet    benztropine (COGENTIN) 0.5 MG tablet    traZODone (DESYREL) 100 MG tablet    2. Generalized anxiety disorder  F41.1 FLUoxetine (PROZAC) 20 MG capsule    gabapentin (NEURONTIN) 300 MG capsule      Past Psychiatric History: opioid dependence (in remission), marijuana use (in remission), and schizophrenia spectrum disorder  Past Medical History:  Past Medical History:  Diagnosis Date   Headache(784.0)    HSIL (high grade squamous intraepithelial lesion) on Pap smear of cervix    Hypertension    Marijuana abuse 08/20/2020   Obesity     Past Surgical History:  Procedure Laterality Date   CESAREAN SECTION     ORIF ANKLE FRACTURE  Right 12/22/2012   Procedure: OPEN REDUCTION INTERNAL FIXATION (ORIF) ANKLE FRACTURE;  Surgeon: Mable Paris, MD;  Location: [facility];  Service: Orthopedics;  Laterality: Right;   sweat gland      Family Psychiatric History: grand aunt noted to have mental health conditions however she is unaware of which ones.  Paternal family members has mental health conditions however is unsure of  which ones  Family History:  Family History  Problem Relation Age of Onset   Hypertension Mother    Diabetes Mother    Diabetes Maternal Grandmother    Breast cancer Paternal Grandmother     Social History:  Social History   Socioeconomic History   Marital status: Single    Spouse name: n/a   Number of children: 2   Years of education: Not on file   Highest education level: Associate degree: occupational, Scientist, product/process development, or vocational program  Occupational History   Not on file  Tobacco Use   Smoking status: Every Day    Packs/day: 0.50    Types: Cigarettes   Smokeless tobacco: Never  Vaping Use   Vaping Use: Never used  Substance and Sexual Activity   Alcohol use: No   Drug use: Not Currently   Sexual activity: Not Currently    Birth control/protection: Implant  Other Topics Concern   Not on file  Social History Narrative   Not on file   Social Determinants of Health   Financial Resource Strain: Medium Risk   Difficulty of Paying Living Expenses: Somewhat hard  Food Insecurity: No Food Insecurity   Worried About Programme researcher, broadcasting/film/video in the Last Year: Never true   Ran Out of Food in the Last Year: Never true  Transportation Needs: No Transportation Needs   Lack of Transportation (Medical): No   Lack of Transportation (Non-Medical): No  Physical Activity: Insufficiently Active   Days of Exercise per Week: 2 days   Minutes of Exercise per Session: 30 min  Stress: Stress Concern Present   Feeling of Stress : Rather much  Social Connections: Moderately Isolated   Frequency of Communication with Friends and Family: Twice a week   Frequency of Social Gatherings with Friends and Family: Once a week   Attends Religious Services: More than 4 times per year   Active Member of [employer] or Organizations: No   Attends Banker Meetings: Never   Marital Status: Never married    Allergies:  Allergies  Allergen Reactions   Latex Hives    Metabolic Disorder  Labs: Lab Results  Component Value Date   HGBA1C 5.4 08/19/2020   MPG 108.28 08/19/2020   Lab Results  Component Value Date   PROLACTIN 326.0 (H) 08/23/2020   Lab Results  Component Value Date   CHOL 169 08/19/2020   TRIG 62 08/19/2020   HDL 37 (L) 08/19/2020   CHOLHDL 4.6 08/19/2020   VLDL 12 08/19/2020   LDLCALC 120 (H) 08/19/2020   Lab Results  Component Value Date   TSH 2.090 08/15/2020    Therapeutic Level Labs: No results found for: LITHIUM No results found for: VALPROATE No components found for:  CBMZ  Current Medications: Current Outpatient Medications  Medication Sig Dispense Refill   gabapentin (NEURONTIN) 300 MG capsule Take 1 capsule (300 mg total) by mouth 3 (three) times daily. 90 capsule 3   ARIPiprazole (ABILIFY) 20 MG tablet Take 1 tablet (20 mg total) by mouth daily. 30 tablet 3   benztropine (COGENTIN) 0.5 MG  tablet Take 1 tablet (0.5 mg total) by mouth 2 (two) times daily as needed for tremors (EPS). 30 tablet 3   FLUoxetine (PROZAC) 20 MG capsule Take 1 capsule (20 mg total) by mouth daily. 30 capsule 3   losartan (COZAAR) 25 MG tablet TAKE 1 TABLET (25 MG TOTAL) BY MOUTH DAILY. 30 tablet 3   metroNIDAZOLE (METROGEL VAGINAL) 0.75 % vaginal gel Place 1 Applicatorful vaginally at bedtime. Use for 5 days. 70 g 0   mineral oil-hydrophilic petrolatum (AQUAPHOR) ointment Apply topically as needed for dry skin. 420 g 0   polyethylene glycol powder (GLYCOLAX/MIRALAX) 17 GM/SCOOP powder Take 17 g by mouth 2 (two) times daily as needed. (Patient not taking: Reported on 10/08/2020) 3350 g 1   SUBOXONE 8-2 MG FILM Place 1.5 Film under the tongue 4 (four) times daily.     traZODone (DESYREL) 100 MG tablet Take 1 tablet (100 mg total) by mouth at bedtime as needed for sleep. 30 tablet 3   No current facility-administered medications for this visit.     Musculoskeletal: Strength & Muscle Tone:  Unable to assess due to telehealth visit Gait & Station:  Unable to  assess due to telehealth visit Patient leans: N/A  Psychiatric Specialty Exam: Review of Systems  unknown if currently breastfeeding.There is no height or weight on file to calculate BMI.  General Appearance: Well Groomed  Eye Contact:  Good  Speech:  Clear and Coherent and Normal Rate  Volume:  Normal  Mood:  Anxious and Depressed  Affect:  Appropriate and Congruent  Thought Process:  Coherent, Goal Directed and Linear  Orientation:  Full (Time, Place, and Person)  Thought Content: WDL and Logical   Suicidal Thoughts:  Yes.  without intent/plan  Homicidal Thoughts:  No  Memory:  Immediate;   Good Recent;   Good Remote;   Good  Judgement:  Good  Insight:  Good and Fair  Psychomotor Activity:  Normal  Concentration:  Concentration: Good and Attention Span: Good  Recall:  Good  Fund of Knowledge: Good  Language: Good  Akathisia:  No  Handed:  Right  AIMS (if indicated): Not done  Assets:  Communication Skills Desire for Improvement Financial Resources/Insurance Housing Leisure Time Social Support  ADL's:  Intact  Cognition: WNL  Sleep:  Poor   Screenings: AIMS    Flowsheet Row Office Visit from 09/03/2020 in [facility]  AIMS Total Score 0 0      GAD-7    Flowsheet Row Video Visit from 02/03/2021 in [facility] Clinical Support from 09/19/2020 in [facility] Office Visit from 09/03/2020 in [facility] Routine Prenatal from 12/15/2017 in [facility] Routine Prenatal from 11/29/2017 in [facility]  Total GAD-7 Score 20 21 20 2 7       PHQ2-9    Flowsheet Row Video Visit from 02/03/2021 in [facility] Office Visit from 10/08/2020 in [facility] Clinical Support from  09/19/2020 in [facility] Counselor from 09/09/2020 in [facility] Office Visit from 09/03/2020 in [facility]  PHQ-2 Total Score 6 6 4 5 6   PHQ-9 Total Score 27 23 18 18 22       Flowsheet Row Video Visit from 02/03/2021 in [facility] Clinical Support from  09/19/2020 in [facility] Counselor from 09/09/2020 in [facility]  C-SSRS RISK CATEGORY Error: Q7 should not be populated when Q6 is No Error: Q7 should not be populated when Q6 is No No Risk        Assessment and Plan: Patient endorses symptoms of anxiety and depression. Today she is agreeable to increasing Prozac 10 mg to 20 mg to help manage anxiety and depression. She is also agreeable to restarting gabapentin 300 mg three times daily to help mood, anxiety, and sleep. Trazodone not increased at this time but may be at next visit. She notes she found hydroxyzine ineffective and would like to discontinue it. She will continue all other medications as prescribed.  1. Schizophrenia spectrum disorder with psychotic disorder type not yet determined (HCC)  Continue- ARIPiprazole (ABILIFY) 20 MG tablet; Take 1 tablet (20 mg total) by mouth daily.  Dispense: 30 tablet; Refill: 3 Continue- benztropine (COGENTIN) 0.5 MG tablet; Take 1 tablet (0.5 mg total) by mouth 2 (two) times daily as needed for tremors (EPS).  Dispense: 30 tablet; Refill: 3 -Continue traZODone (DESYREL) 100 MG tablet; Take 1 tablet (100 mg total) by mouth at bedtime as needed for sleep.  Dispense: 30 tablet; Refill: 3  2. Generalized anxiety disorder  Continue- FLUoxetine (PROZAC) 20 MG capsule; Take 1 capsule (20 mg total) by mouth daily.  Dispense: 30 capsule; Refill: 3 Start- gabapentin (NEURONTIN) 300 MG capsule; Take 1 capsule (300 mg total) by mouth 3 (three) times daily.  Dispense: 90 capsule; Refill:  3      Follow-up in 2 months   Shanna Cisco, NP 02/03/2021, 3:51 PM

## 2021-02-07 ENCOUNTER — Telehealth (HOSPITAL_COMMUNITY): Payer: Self-pay | Admitting: Psychiatry

## 2021-02-07 NOTE — Telephone Encounter (Signed)
Patient calling to speak to her provider. Please call patient at [phone].

## 2021-02-11 ENCOUNTER — Other Ambulatory Visit: Payer: Self-pay

## 2021-02-11 ENCOUNTER — Ambulatory Visit: Payer: Medicaid Other | Admitting: Obstetrics and Gynecology

## 2021-02-12 ENCOUNTER — Other Ambulatory Visit (HOSPITAL_COMMUNITY): Payer: Self-pay | Admitting: Psychiatry

## 2021-02-12 DIAGNOSIS — F900 Attention-deficit hyperactivity disorder, predominantly inattentive type: Secondary | ICD-10-CM

## 2021-02-12 MED ORDER — ATOMOXETINE HCL 40 MG PO CAPS: 40.0000 mg | ORAL_CAPSULE | Freq: Every day | ORAL | 3 refills | Status: DC

## 2021-02-12 NOTE — Telephone Encounter (Signed)
Patient informed writer that she is fatigued and having poor concentration.  She notes that she is inattentive to mentally taxing task, forgetful, disorganized, and notes that she has poor listening skills.  She informed Clinical research associate that she lacks energy to do things.  She informed Clinical research associate that she goes to the Suboxone clinic and reports that they recommended giving her Vyvanse.  Provider informed patient that Vyvanse is an addictive medication and at this time it would not be prescribed.  She endorsed understanding and agreed.  She noted that she took clonidine in the past to help manage ADHD however reports it was not successful.  Today she is agreeable to starting Strattera 40 mg daily to help manage symptoms of ADHD.  Provider encouraged patient to set up an appoint with her PCP and have labs drawn to be evaluated for other issues that could be causing fatigue or poor concentration such as anemia and issues with her thyroid.  She endorsed understanding and agreed.  Patient has no current labs.  Last labs drawn in April.  No other concerns at this time.

## 2021-02-17 ENCOUNTER — Telehealth (HOSPITAL_COMMUNITY): Payer: Self-pay | Admitting: *Deleted

## 2021-02-17 NOTE — Telephone Encounter (Signed)
Provider spoke to patient and her mother.  Patient notes that she continues to be fatigued.  She also notes that she is unable to work and has been more irritable.  She informed Clinical research associate that [facility] and was informed of ACT teams.  She asked provider if she knew of any ACT team's in the area.  Provider gave patient the number to [facility] [phone].  Provider also encouraged patient to follow-up with her primary care doctor which she was agreeable to.  Patient requested to have past medical records [facility] which provider was agreeable to.  She requested to be sent to Dr. Cathey Endow at fax number [phone].  No other concerns at this time.

## 2021-02-17 NOTE — Telephone Encounter (Signed)
PATIENT 'S MOM(THERESA) CALLED LVM STATING THAT Susan English ISN'T DOING WELL & THAT IT'S VERY IMPORTANT THAT DR PARSONS PLEASE GET BACK IN TOUCH WITH HER SOON AS POSSIBLE.

## 2021-03-04 ENCOUNTER — Other Ambulatory Visit (HOSPITAL_COMMUNITY)
Admission: RE | Admit: 2021-03-04 | Discharge: 2021-03-04 | Disposition: A | Discharge status: Home / Self Care | Payer: Medicaid Other | Source: Ambulatory Visit | Attending: Obstetrics and Gynecology | Admitting: Obstetrics and Gynecology

## 2021-03-04 ENCOUNTER — Other Ambulatory Visit: Payer: Self-pay

## 2021-03-04 ENCOUNTER — Encounter: Payer: Self-pay | Admitting: Obstetrics & Gynecology

## 2021-03-04 ENCOUNTER — Ambulatory Visit (INDEPENDENT_AMBULATORY_CARE_PROVIDER_SITE_OTHER): Payer: Medicaid Other | Admitting: Obstetrics & Gynecology

## 2021-03-04 VITALS — BP 144/99 | HR 82 | Wt 299.4 lb

## 2021-03-04 DIAGNOSIS — R87613 High grade squamous intraepithelial lesion on cytologic smear of cervix (HGSIL): Secondary | ICD-10-CM

## 2021-03-04 LAB — POCT URINE PREGNANCY: Preg Test, Ur: NEGATIVE

## 2021-03-04 NOTE — Progress Notes (Signed)
Patient ID: Susan English, female   DOB: Nov 29, 1990, 30 y.o.   MRN: [id number]  Chief Complaint  Patient presents with   New Patient (Initial Visit)   Gynecologic Exam    HPI Susan English is a 30 y.o. female.  [id number] Patient's last menstrual period was 02/11/2021.  HPI  Indications: Pap smear on May 2022 showed: high-grade squamous intraepithelial neoplasia  (HGSIL-encompassing moderate and severe dysplasia). Previous colposcopy: CIN 2 and in 2018. Prior cervical treatment: cryosurgery.  Past Medical History:  Diagnosis Date   Headache(784.0)    HSIL (high grade squamous intraepithelial lesion) on Pap smear of cervix    Hypertension    Marijuana abuse 08/20/2020   Obesity     Past Surgical History:  Procedure Laterality Date   CESAREAN SECTION     ORIF ANKLE FRACTURE Right 12/22/2012   Procedure: OPEN REDUCTION INTERNAL FIXATION (ORIF) ANKLE FRACTURE;  Surgeon: Mable Paris, MD;  Location: [facility];  Service: Orthopedics;  Laterality: Right;   sweat gland      Family History  Problem Relation Age of Onset   Hypertension Mother    Diabetes Mother    Diabetes Maternal Grandmother    Breast cancer Paternal Grandmother     Social History Social History   Tobacco Use   Smoking status: Every Day    Packs/day: 0.50    Types: Cigarettes   Smokeless tobacco: Never  Vaping Use   Vaping Use: Never used  Substance Use Topics   Alcohol use: No   Drug use: Not Currently    Allergies  Allergen Reactions   Latex Hives    Current Outpatient Medications  Medication Sig Dispense Refill   ARIPiprazole (ABILIFY) 20 MG tablet Take 1 tablet (20 mg total) by mouth daily. 30 tablet 3   atomoxetine (STRATTERA) 40 MG capsule Take 1 capsule (40 mg total) by mouth daily. 30 capsule 3   FLUoxetine (PROZAC) 20 MG capsule Take 1 capsule (20 mg total) by mouth daily. 30 capsule 3   gabapentin (NEURONTIN) 300 MG capsule Take 1 capsule (300 mg total) by mouth  3 (three) times daily. 90 capsule 3   SUBOXONE 8-2 MG FILM Place 1.5 Film under the tongue 4 (four) times daily.     traZODone (DESYREL) 100 MG tablet Take 1 tablet (100 mg total) by mouth at bedtime as needed for sleep. 30 tablet 3   benztropine (COGENTIN) 0.5 MG tablet Take 1 tablet (0.5 mg total) by mouth 2 (two) times daily as needed for tremors (EPS). (Patient not taking: Reported on 03/04/2021) 30 tablet 3   losartan (COZAAR) 25 MG tablet TAKE 1 TABLET (25 MG TOTAL) BY MOUTH DAILY. (Patient not taking: Reported on 03/04/2021) 30 tablet 3   metroNIDAZOLE (METROGEL VAGINAL) 0.75 % vaginal gel Place 1 Applicatorful vaginally at bedtime. Use for 5 days. 70 g 0   mineral oil-hydrophilic petrolatum (AQUAPHOR) ointment Apply topically as needed for dry skin. (Patient not taking: Reported on 03/04/2021) 420 g 0   polyethylene glycol powder (GLYCOLAX/MIRALAX) 17 GM/SCOOP powder Take 17 g by mouth 2 (two) times daily as needed. (Patient not taking: Reported on 03/04/2021) 3350 g 1   No current facility-administered medications for this visit.    Review of Systems Review of Systems  Blood pressure (!) 144/99, pulse 82, weight 299 lb 6.4 oz (135.8 kg), last menstrual period 02/11/2021, unknown if currently breastfeeding.  Physical Exam Physical Exam  Data Reviewed Biopsies and pap results  Assessment  Procedure Details  The risks and benefits of the procedure and Written informed consent obtained.  Speculum placed in vagina and excellent visualization of cervix achieved, cervix swabbed x 3 with acetic acid solution. Patient given informed consent, signed copy in the chart, time out was performed.  Placed in lithotomy position. Cervix viewed with speculum and colposcope after application of acetic acid.   Colposcopy adequate?  yes Acetowhite lesions?no Punctation?no Mosaicism?  no Abnormal vasculature?  no Biopsies?6 and 12 ECC?yes  COMMENTS: Patient was given post procedure  instructions.     Specimens: ECC and Bx 12 and 6  Complications: none.     Plan    Specimens labelled and sent to Pathology. Return to discuss Pathology results in 2 weeks.      Scheryl Darter 03/04/2021, 4:40 PM

## 2021-03-05 ENCOUNTER — Encounter: Payer: Self-pay | Admitting: Obstetrics & Gynecology

## 2021-03-06 LAB — SURGICAL PATHOLOGY

## 2021-03-11 ENCOUNTER — Other Ambulatory Visit (HOSPITAL_COMMUNITY): Payer: Self-pay | Admitting: Psychiatry

## 2021-03-11 DIAGNOSIS — F411 Generalized anxiety disorder: Secondary | ICD-10-CM

## 2021-03-18 NOTE — Progress Notes (Signed)
TC to notify patient. Verbalized understanding.

## 2021-03-26 ENCOUNTER — Other Ambulatory Visit: Payer: Self-pay

## 2021-03-26 ENCOUNTER — Ambulatory Visit: Payer: Medicaid Other | Admitting: Family Medicine

## 2021-03-26 VITALS — BP 143/91 | HR 94 | Wt 316.0 lb

## 2021-03-26 DIAGNOSIS — L732 Hidradenitis suppurativa: Secondary | ICD-10-CM

## 2021-03-26 HISTORY — DX: Hidradenitis suppurativa: L73.2

## 2021-03-26 MED ORDER — DOXYCYCLINE HYCLATE 100 MG PO TABS: 100.0000 mg | ORAL_TABLET | Freq: Two times a day (BID) | ORAL | 0 refills | Status: AC

## 2021-03-26 NOTE — Progress Notes (Signed)
    SUBJECTIVE:   CHIEF COMPLAINT / HPI:   Recurrent skin infections Patient reports she had a boil that has been underneath her left armpit for quite some time, about 2 weeks ago it opened and has been draining purulent material that does have an odor with it ever since.  She has had these multiple times before in her arms and underneath her breasts.  She had surgery in her left axilla previously for the same thing.  She has not been having any fevers and it is no longer tender to palpation but it is still draining.  PERTINENT  PMH / PSH: Reviewed   OBJECTIVE:   BP (!) 143/91   Pulse 94   Wt (!) 316 lb (143.3 kg)   LMP 03/11/2021   Breastfeeding No   BMI 48.05 kg/m   General: NAD, well-appearing, well-nourished Respiratory: No respiratory distress, breathing comfortably, able to speak in full sentences Skin: warm and dry, nodule with area of drainage present on the left axilla as pictured below, nontender to palpation in the area, able to express purulent discharge.  Multiple areas of scarring within the skin folds, appears to be [facility] stage II with scarring. Psych: Appropriate affect and mood    ASSESSMENT/PLAN:   Hidradenitis suppurativa Patient with appearance of hidradenitis suppurativa underneath the left axilla as well as between and underneath bilateral breasts.  Area of drainage in the left axilla x2 weeks, appears to be [place] stage II with scarring.  Given the open wound, we will treat with oral doxycycline x14 days and patient will follow up and we will see if we will be able to transition to a topical antibiotic (such as clindamycin).  Patient to also follow-up with surgery if recurrent episodes.   Evelena Leyden, DO [place] [facility]

## 2021-03-26 NOTE — Patient Instructions (Signed)
I am sending in an antibiotic that you will take twice a day for 14 days. I want you to have an appointment for the end of that time so that we can see if we will be able to switch to a topical medication.     Hidradenitis Suppurativa Hidradenitis suppurativa is a long-term (chronic) skin disease. It is similar to a severe form of acne, but it affects areas of the body where acne would be unusual, especially areas of the body where skin rubs against skin and becomes moist. These include: Underarms. Groin. Genital area. Buttocks. Upper thighs. Breasts. Hidradenitis suppurativa may start out as small lumps or pimples caused by blocked sweat glands or hair follicles. Pimples may develop into deep sores that break open (rupture) and drain pus. Over time, affected areas of skin may thicken and become scarred. This condition is rare and does not spread from person to person (non-contagious). What are the causes? The exact cause of this condition is not known. It may be related to: Female and female hormones. An overactive disease-fighting system (immune system). The immune system may over-react to blocked hair follicles or sweat glands and cause swelling and pus-filled sores. What increases the risk? You are more likely to develop this condition if you: Are female. Are 14-39 years old. Have a family history of hidradenitis suppurativa. Have a personal history of acne. Are overweight. Smoke. Take the medicine lithium. What are the signs or symptoms? The first symptoms are usually painful bumps in the skin, similar to pimples. The condition may get worse over time (progress), or it may only cause mild symptoms. If the disease progresses, symptoms may include: Skin bumps getting bigger and growing deeper into the skin. Bumps rupturing and draining pus. Itchy, infected skin. Skin getting thicker and scarred. Tunnels under the skin (fistulas) where pus drains from a bump. Pain during daily  activities, such as pain during walking if your groin area is affected. Emotional problems, such as stress or depression. This condition may affect your appearance and your ability or willingness to wear certain clothes or do certain activities. How is this diagnosed? This condition is diagnosed by a health care provider who specializes in skin diseases (dermatologist). You may be diagnosed based on: Your symptoms and medical history. A physical exam. Testing a pus sample for infection. Blood tests. How is this treated? Your treatment will depend on how severe your symptoms are. The same treatment will not work for everybody with this condition. You may need to try several treatments to find what works best for you. Treatment may include: Cleaning and bandaging (dressing) your wounds as needed. Lifestyle changes, such as new skin care routines. Taking medicines, such as: Antibiotics. Acne medicines. Medicines to reduce the activity of the immune system. A diabetes medicine (metformin). Birth control pills, for women. Steroids to reduce swelling and pain. Working with a mental health care provider, if you experience emotional distress due to this condition. If you have severe symptoms that do not get better with medicine, you may need surgery. Surgery may involve: Using a laser to clear the skin and remove hair follicles. Opening and draining deep sores. Removing the areas of skin that are diseased and scarred. Follow these instructions at home: Medicines  Take over-the-counter and prescription medicines only as told by your health care provider. If you were prescribed an antibiotic medicine, take it as told by your health care provider. Do not stop taking the antibiotic even if your condition improves. Skin  care If you have open wounds, cover them with a clean dressing as told by your health care provider. Keep wounds clean by washing them gently with soap and water when you bathe. Do  not shave the areas where you get hidradenitis suppurativa. Do not wear deodorant. Wear loose-fitting clothes. Try to avoid getting overheated or sweaty. If you get sweaty or wet, change into clean, dry clothes as soon as you can. To help relieve pain and itchiness, cover sore areas with a warm, clean washcloth (warm compress) for 5-10 minutes as often as needed. If told by your health care provider, take a bleach bath twice a week: Fill your bathtub halfway with water. Pour in  cup of unscented household bleach. Soak in the tub for 5-10 minutes. Only soak from the neck down. Avoid water on your face and hair. Shower to rinse off the bleach from your skin. General instructions Learn as much as you can about your disease so that you have an active role in your treatment. Work closely with your health care provider to find treatments that work for you. If you are overweight, work with your health care provider to lose weight as recommended. Do not use any products that contain nicotine or tobacco, such as cigarettes and e-cigarettes. If you need help quitting, ask your health care provider. If you struggle with living with this condition, talk with your health care provider or work with a mental health care provider as recommended. Keep all follow-up visits as told by your health care provider. This is important. Where to find more information Hidradenitis Suppurativa Foundation, Inc.: https://www.hs-foundation.org/ American Academy of Dermatology: [employer] Contact a health care provider if you have: A flare-up of hidradenitis suppurativa. A fever or chills. Trouble controlling your symptoms at home. Trouble doing your daily activities because of your symptoms. Trouble dealing with emotional problems related to your condition. Summary Hidradenitis suppurativa is a long-term (chronic) skin disease. It is similar to a severe form of acne, but it affects areas of the body where acne  would be unusual. The first symptoms are usually painful bumps in the skin, similar to pimples. The condition may only cause mild symptoms, or it may get worse over time (progress). If you have open wounds, cover them with a clean dressing as told by your health care provider. Keep wounds clean by washing them gently with soap and water when you bathe. Besides skin care, treatment may include medicines, laser treatment, and surgery. This information is not intended to replace advice given to you by your health care provider. Make sure you discuss any questions you have with your health care provider. Document Revised: 02/20/2020 Document Reviewed: 02/20/2020 Elsevier Patient Education  2022 [employer].

## 2021-03-26 NOTE — Assessment & Plan Note (Signed)
Patient with appearance of hidradenitis suppurativa underneath the left axilla as well as between and underneath bilateral breasts.  Area of drainage in the left axilla x2 weeks, appears to be Roy stage II with scarring.  Given the open wound, we will treat with oral doxycycline x14 days and patient will follow up and we will see if we will be able to transition to a topical antibiotic (such as clindamycin).  Patient to also follow-up with surgery if recurrent episodes.

## 2021-04-15 ENCOUNTER — Ambulatory Visit: Payer: Medicaid Other | Admitting: Family Medicine

## 2021-05-07 ENCOUNTER — Telehealth (HOSPITAL_COMMUNITY): Payer: Medicaid Other | Admitting: Psychiatry

## 2021-05-07 ENCOUNTER — Ambulatory Visit: Payer: Medicaid Other | Admitting: Family Medicine

## 2021-05-07 ENCOUNTER — Telehealth (HOSPITAL_COMMUNITY): Payer: Self-pay | Admitting: Psychiatry

## 2021-05-07 ENCOUNTER — Encounter (HOSPITAL_COMMUNITY): Payer: Self-pay

## 2021-05-07 ENCOUNTER — Other Ambulatory Visit (HOSPITAL_COMMUNITY): Payer: Self-pay | Admitting: Psychiatry

## 2021-05-07 DIAGNOSIS — F411 Generalized anxiety disorder: Secondary | ICD-10-CM

## 2021-05-07 DIAGNOSIS — F29 Unspecified psychosis not due to a substance or known physiological condition: Secondary | ICD-10-CM

## 2021-05-07 MED ORDER — ARIPIPRAZOLE 20 MG PO TABS: 20.0000 mg | ORAL_TABLET | Freq: Every day | ORAL | 3 refills | Status: DC

## 2021-05-07 MED ORDER — GABAPENTIN 300 MG PO CAPS: 300.0000 mg | ORAL_CAPSULE | Freq: Three times a day (TID) | ORAL | 3 refills | Status: DC

## 2021-05-07 MED ORDER — TRAZODONE HCL 100 MG PO TABS: 100.0000 mg | ORAL_TABLET | Freq: Every evening | ORAL | 3 refills | Status: AC | PRN

## 2021-05-07 MED ORDER — BENZTROPINE MESYLATE 0.5 MG PO TABS: 0.5000 mg | ORAL_TABLET | Freq: Two times a day (BID) | ORAL | 3 refills | Status: DC | PRN

## 2021-05-07 MED ORDER — FLUOXETINE HCL 20 MG PO CAPS: 20.0000 mg | ORAL_CAPSULE | Freq: Every day | ORAL | 3 refills | Status: DC

## 2021-05-07 NOTE — Telephone Encounter (Signed)
Patient contacted office to reschedule appt. Next available July 15, 2021. Patient requesting refills.

## 2021-05-07 NOTE — Telephone Encounter (Signed)
Medications refilled and sent to preferred pharmacy.

## 2021-07-15 ENCOUNTER — Telehealth (HOSPITAL_COMMUNITY): Payer: Medicaid Other | Admitting: Psychiatry

## 2021-07-17 ENCOUNTER — Encounter (HOSPITAL_COMMUNITY): Payer: Self-pay

## 2021-07-17 ENCOUNTER — Telehealth (HOSPITAL_COMMUNITY): Payer: Medicaid Other | Admitting: Psychiatry

## 2021-10-14 ENCOUNTER — Encounter: Payer: Self-pay | Admitting: *Deleted

## 2022-01-09 ENCOUNTER — Ambulatory Visit (INDEPENDENT_AMBULATORY_CARE_PROVIDER_SITE_OTHER): Payer: Medicaid Other | Admitting: Student

## 2022-01-09 ENCOUNTER — Encounter: Payer: Self-pay | Admitting: Student

## 2022-01-09 VITALS — BP 149/100 | HR 86 | Ht 68.0 in | Wt 323.0 lb

## 2022-01-09 DIAGNOSIS — F29 Unspecified psychosis not due to a substance or known physiological condition: Secondary | ICD-10-CM

## 2022-01-09 DIAGNOSIS — I10 Essential (primary) hypertension: Secondary | ICD-10-CM

## 2022-01-09 DIAGNOSIS — Z6841 Body Mass Index (BMI) 40.0 and over, adult: Secondary | ICD-10-CM

## 2022-01-09 HISTORY — DX: Morbid (severe) obesity due to excess calories: E66.01

## 2022-01-09 MED ORDER — LOSARTAN POTASSIUM 25 MG PO TABS: 50.0000 mg | ORAL_TABLET | Freq: Every day | ORAL | 1 refills | Status: DC

## 2022-01-09 NOTE — Patient Instructions (Signed)
It was great to see you! Thank you for allowing me to participate in your care!  I recommend that you always bring your medications to each appointment as this makes it easy to ensure we are on the correct medications and helps [place] not miss when refills are needed.  Our plans for today:  High Blood pressure  We are increasing the dose of losartan to 50 mg daily. (I will send a refill)  Follow up in 2 weeks for another blood pressure check    Will also check A1c at that time   Depression -Start taking time for yourself everyday, to check in with how your feeling, and what's going on with your life. You don't have to have solutions, but take time to address your needs every day  -You can find guided mediatation on youtube.   Search for: guided mindfulness meditations -Start going for walks early in the am or in the evening. Walk for 15-20 min, get heart rate up, but not short of breath.   -Walking can help you get a break from your stressors and is good for your depression   Take care and seek immediate care sooner if you develop any concerns.   Dr. Bess Kinds, MD [facility]

## 2022-01-09 NOTE — Progress Notes (Signed)
  SUBJECTIVE:   CHIEF COMPLAINT / HPI:   Patient wanting to discuss BP meds  HTN: BP Readings from Last 3 Encounters:  01/09/22 (!) 149/100  03/26/21 (!) 143/91  03/04/21 (!) 144/99  Meds: losartan 25 mg daily Compliance: taking every day, but is not managing BP well. BP highest 150/120  Also complaining of peeing a lot, after peeing.  Mental Health/Depression Seeing a psychiartrist, and on medicines. Denies any intent or plans of suicide, but reports a baseline level of depression. No plans to end life because of her kids, will have the thoughts and will close eyes/meditate/listen to music. Last saw psychiatrist 3 weeks ago, no changes in meds, but was started on ambien for sleep, hasn't been taking it out fear of sleep walking. Last year was in the hospital for 15 days was in the psych hospital, had a meltdown and was cussing in the street.    Knee pain and ankle pain.   PERTINENT  PMH / PSH: HTN, Schizophrenia spectrum      OBJECTIVE:  BP (!) 149/100   Pulse 86   Ht 5\' 8"  (1.727 m)   Wt (!) 323 lb (146.5 kg)   SpO2 99%   BMI 49.11 kg/m   Psych: Normal affect and good mood, not responding to internal stimuli  ASSESSMENT/PLAN:  Hypertension Patient BP elevated today, and she repots compliance with meds. Wanting to go up in dose. Will have patient f/u in 2 weeks. Will also have A1c checked in 2 weeks. -Losartan 50 mg daily -F/u 2 weeks -Collect A1c at next visit  Schizophrenia spectrum disorder with psychotic disorder type not yet determined [facility]) Patient reports significant psych history, screening high on PHQ 9 (24). Concerning for severe depression. Patient reports she has SI sometimes but loves her kids too much to do anything, and has no plans to end her life or harm herself. She reports having seen her psychiatrist 3 weeks ago, and they did not change her meds, but gave her Ambien for sleep. Patient not taking Ambien. Patient also reports that she sees a therapist  and it has been helping. Patient reports a lot of social stressors that give her symptoms of depression.  -Encouraged patient to try exercise and mindfulness meditation to help with social stressors. -F/u in 2 weeks   Patient with complaints of knee and ankle pain. Encouraged her to make another appointment for work up.  Orders Placed This Encounter  Procedures   Basic Metabolic Panel   No orders of the defined types were placed in this encounter.  No follow-ups on file. @SIGNNOTE @

## 2022-01-09 NOTE — Assessment & Plan Note (Signed)
Patient BP elevated today, and she repots compliance with meds. Wanting to go up in dose. Will have patient f/u in 2 weeks. Will also have A1c checked in 2 weeks. -Losartan 50 mg daily -F/u 2 weeks -Collect A1c at next visit

## 2022-01-09 NOTE — Assessment & Plan Note (Addendum)
Patient reports significant psych history, screening high on PHQ 9 (24). Concerning for severe depression. Patient reports she has SI sometimes but loves her kids too much to do anything, and has no plans to end her life or harm herself. She reports having seen her psychiatrist 3 weeks ago, and they did not change her meds, but gave her Ambien for sleep. Patient not taking Ambien. Patient also reports that she sees a therapist and it has been helping. Patient reports a lot of social stressors that give her symptoms of depression.  -Encouraged patient to try exercise and mindfulness meditation to help with social stressors. -F/u in 2 weeks

## 2022-01-10 LAB — BASIC METABOLIC PANEL
BUN/Creatinine Ratio: 19 (ref 9–23)
BUN: 13 mg/dL (ref 6–20)
CO2: 22 mmol/L (ref 20–29)
Calcium: 9.4 mg/dL (ref 8.7–10.2)
Chloride: 104 mmol/L (ref 96–106)
Creatinine, Ser: 0.67 mg/dL (ref 0.57–1.00)
Glucose: 76 mg/dL (ref 70–99)
Potassium: 3.9 mmol/L (ref 3.5–5.2)
Sodium: 141 mmol/L (ref 134–144)
eGFR: 118 mL/min/{1.73_m2} (ref >59)

## 2022-01-15 ENCOUNTER — Encounter: Payer: Self-pay | Admitting: Student

## 2022-02-02 ENCOUNTER — Ambulatory Visit (INDEPENDENT_AMBULATORY_CARE_PROVIDER_SITE_OTHER): Payer: Medicaid Other | Admitting: Student

## 2022-02-02 VITALS — BP 130/90 | HR 94 | Ht 68.0 in | Wt 319.0 lb

## 2022-02-02 DIAGNOSIS — L732 Hidradenitis suppurativa: Secondary | ICD-10-CM

## 2022-02-02 DIAGNOSIS — M25561 Pain in right knee: Secondary | ICD-10-CM | POA: Diagnosis not present

## 2022-02-02 DIAGNOSIS — Z716 Tobacco abuse counseling: Secondary | ICD-10-CM

## 2022-02-02 DIAGNOSIS — M25562 Pain in left knee: Secondary | ICD-10-CM

## 2022-02-02 MED ORDER — CLINDAMYCIN PHOSPHATE 1 % EX LOTN: TOPICAL_LOTION | Freq: Two times a day (BID) | CUTANEOUS | 0 refills | Status: DC

## 2022-02-02 MED ORDER — DOXYCYCLINE HYCLATE 100 MG PO TABS: 100.0000 mg | ORAL_TABLET | Freq: Two times a day (BID) | ORAL | 0 refills | Status: DC

## 2022-02-02 NOTE — Assessment & Plan Note (Signed)
Opted to discuss at next visit given current acute concerns.  Provided smoking cessation handout.  On chart review, nicotine replacement therapy would likely be best option.  Follow-up in 2 weeks for further discussion.

## 2022-02-02 NOTE — Assessment & Plan Note (Signed)
Secondary to recent increase in activity.  No concern for fracture or imaging required at this time.  Encouraged to stretch before exercising and discussed conservative management.

## 2022-02-02 NOTE — Assessment & Plan Note (Signed)
Early stage II with scarring, present in left axilla and inferomedial left breast with trace drainage.  Will treat with oral and topical antibiotics.  Given patient also has hypertension, would likely benefit from spironolactone.  Return in 2 weeks and if still present will need extended therapy of clindamycin and surgical referral.

## 2022-02-02 NOTE — Progress Notes (Signed)
  SUBJECTIVE:   CHIEF COMPLAINT / HPI:   Boils: she has been getting boils 16-17 years. She gets them in her inner thighs, axilla, and near her breasts. She has never received a formal diagnosis. Her grandmother and cousin had severe boils. She had surgery for a boil in her right axilla but states it was botched (approximately 2006). She has never seen a dermatologist.   Knee soreness: she has been line dancing and yoga. She has been having right knee tenderness and swelling from that. Pain 7/10 currently because she got a muscle relaxer from her mother. She started doing these routines in the last week to make active change for her weight. She has not tried tylenol or ibuprofen.  She does have history of right ankle surgery from fracture in 2014.  Opted to discuss smoking cessation at later visit.  PERTINENT  PMH / PSH: HTN, obesity, schizophrenia spectrum disorder, opiate use  OBJECTIVE:  BP (!) 130/90   Pulse 94   Ht 5\' 8"  (1.727 m)   Wt (!) 319 lb (144.7 kg)   SpO2 98%   BMI 48.50 kg/m   General: NAD, pleasant, able to participate in exam Extremities: warm and well perfused, no edema or cyanosis Skin: 1 cm red, indurated nodule with purulent drainage at the 10 o'clock position of inferomedial breast; 1 cm red indurated nodule without appreciable drainage in the left axilla with surrounding scar tissue consistent with hidradenitis suppurativa boils Knee, bilateral: Inspection was negative for erythema, ecchymosis, and effusion. No obvious bony abnormalities or signs of osteophyte development. Palpation yielded no asymmetric warmth; No joint line tenderness; No condyle tenderness; No patellar tenderness; No patellar crepitus. Patellar and quadriceps tendons unremarkable. No obvious Baker's cyst development. ROM normal in flexion (135 degrees) and extension (0 degrees).  Anterior/posterior draw sign negative, Lachman negative, McMurray negative  Chaperone present: Lavell Anchors  ASSESSMENT/PLAN:  Hidradenitis suppurativa Assessment & Plan: Early stage II with scarring, present in left axilla and inferomedial left breast with trace drainage.  Will treat with oral and topical antibiotics.  Given patient also has hypertension, would likely benefit from spironolactone.  Return in 2 weeks and if still present will need extended therapy of clindamycin and surgical referral.  Orders: -     Doxycycline Hyclate; Take 1 tablet (100 mg total) by mouth 2 (two) times daily.  Dispense: 20 tablet; Refill: 0 -     Clindamycin Phosphate; Apply topically 2 (two) times daily for 14 days.  Dispense: 60 mL; Refill: 0  Acute pain of both knees Assessment & Plan: Secondary to recent increase in activity.  No concern for fracture or imaging required at this time.  Encouraged to stretch before exercising and discussed conservative management.   Encounter for smoking cessation counseling Assessment & Plan: Opted to discuss at next visit given current acute concerns.  Provided smoking cessation handout.  On chart review, nicotine replacement therapy would likely be best option.  Follow-up in 2 weeks for further discussion.     Return in about 2 weeks (around 02/16/2022) for Smoking cessation, hidradenitis follow-up. Wells Guiles, DO 02/02/2022, 12:55 PM PGY-2, [place]

## 2022-02-02 NOTE — Patient Instructions (Addendum)
It was great to see you today! Thank you for choosing [facility] for your primary care. Susan English was seen for boils and ankle/knee pain.  Today we addressed: Boils: You have hidradenitis suppurativa.  There are several important things to consider here.  I am prescribing you antibiotics to use locally and orally.  Doxycycline to take twice a day and clinda lotion to apply twice a day for 2 weeks.  If this does not work, we may consider hormonal agents or other controlling medications.  We may consider going to dermatology at a later time. Knee pain: I do not feel that an x-ray would be appropriate at this time.  You may use Tylenol 1 g every 6 hours as needed, ibuprofen 600 mg every 8 hours as needed.  You may also use over-the-counter Voltaren gel.  Keep up the great work with your activities and increase as able as your body is undergoing some changes right now as does any athlete who returns to playing sports. I have attached a handout on steps to quit smoking.  The most important thing is that you are motivated right now.  You may look into various methods such as prescription medications or over-the-counter methods such as nicotine replacement.  If you feel empowered, you may start the over-the-counter nicotine replacement therapy now but if you feel that this is a lot of change all at 1 time, you may wait to discuss in 2 weeks at your next visit.  If you haven't already, sign up for My Chart to have easy access to your labs results, and communication with your primary care physician.  Call the clinic at [phone] if your symptoms worsen or you have any concerns.  You should return to our clinic Return in about 2 weeks (around 02/16/2022) for Smoking cessation, hidradenitis follow-up. Please arrive 15 minutes before your appointment to ensure smooth check in process.  We appreciate your efforts in making this happen.  Thank you for allowing me to participate in your care, Wells Guiles, DO 02/02/2022, 12:01 PM PGY-2, [place]

## 2022-02-03 ENCOUNTER — Other Ambulatory Visit (HOSPITAL_COMMUNITY): Payer: Self-pay

## 2022-02-03 ENCOUNTER — Telehealth: Payer: Self-pay

## 2022-02-03 DIAGNOSIS — L732 Hidradenitis suppurativa: Secondary | ICD-10-CM

## 2022-02-03 NOTE — Telephone Encounter (Signed)
A Prior Authorization was initiated for this patients Clindamycin Phosphate 1% lotion through [facility].   Confirmation #:[id number]

## 2022-02-03 NOTE — Telephone Encounter (Signed)
Patient Advocate Encounter  Received notification from [facility] that the request for prior authorization for Clindamycin Phosphate 1% lotion has been denied due to NON PREFERRED .     Test bill cost came back as $4.00   PREFERRED  DRUG: Clindamycin phosphate pledgets / solution (generic for Cleocin-T)

## 2022-02-09 MED ORDER — CLINDAMYCIN PHOSPHATE 1 % EX SWAB: 1.0000 | Freq: Two times a day (BID) | CUTANEOUS | 1 refills | Status: DC

## 2022-02-09 NOTE — Telephone Encounter (Signed)
Patient calls nurse line regarding PA for clindamycin lotion. The PA for this medication has been denied. I called the pharmacist. He ran the medication without insurance and cost will be $45.   Please advise if alternative is appropriate. If so, please send to [employer].   Thanks.   Talbot Grumbling, RN

## 2022-02-09 NOTE — Addendum Note (Signed)
Addended by: Eustace Quail on: 02/09/2022 01:28 PM   Modules accepted: Orders

## 2022-02-16 ENCOUNTER — Ambulatory Visit: Payer: Medicaid Other | Admitting: Student

## 2022-02-16 NOTE — Progress Notes (Deleted)
  SUBJECTIVE:   CHIEF COMPLAINT / HPI:   Tobacco use: Smoked *** pack/day x *** years (*** pack year history).   Hidradenitis suppurativa: Presented on 9/25 and prescribed doxycycline 100 mg twice daily x10 days, Clindagel twice daily.    PERTINENT  PMH / PSH: ***  Past Medical History:  Diagnosis Date   Gestational diabetes 11/29/2017   Elevated fasting on 2 hour GTT, only one value needs to be elevated to meet criteria.  Patient to see diabetes educator   Headache(784.0)    Hidradenitis suppurativa 03/26/2021   HSIL (high grade squamous intraepithelial lesion) on Pap smear of cervix    Hypertension    Marijuana abuse 08/20/2020   Morbid obesity with BMI of 40.0-44.9, adult ([place]) 01/09/2022   Obesity    Status post cryotherapy of skin lesion 06/11/2017   Cryotherapy of HSIL in June 2018    OBJECTIVE:  There were no vitals taken for this visit.  General: NAD, pleasant, able to participate in exam Cardiac: RRR, no murmurs auscultated Respiratory: CTAB, normal WOB Abdomen: soft, non-tender, non-distended, normoactive bowel sounds Extremities: warm and well perfused, no edema or cyanosis Skin: warm and dry, no rashes noted Neuro: alert, no obvious focal deficits, speech normal Psych: Normal affect and mood  ASSESSMENT/PLAN:  There are no diagnoses linked to this encounter. No follow-ups on file. Wells Guiles, DO 02/16/2022, 12:46 PM PGY-***, [facility] {    This will disappear when note is signed, click to select method of visit    :1}

## 2022-02-25 ENCOUNTER — Other Ambulatory Visit: Payer: Self-pay

## 2022-02-25 ENCOUNTER — Encounter (HOSPITAL_BASED_OUTPATIENT_CLINIC_OR_DEPARTMENT_OTHER): Payer: Self-pay | Admitting: Emergency Medicine

## 2022-02-25 ENCOUNTER — Emergency Department (HOSPITAL_BASED_OUTPATIENT_CLINIC_OR_DEPARTMENT_OTHER)
Admission: EM | Admit: 2022-02-25 | Discharge: 2022-02-25 | Disposition: A | Discharge status: Home / Self Care | Payer: Medicaid Other | Attending: Emergency Medicine | Admitting: Emergency Medicine

## 2022-02-25 DIAGNOSIS — Z79899 Other long term (current) drug therapy: Secondary | ICD-10-CM | POA: Insufficient documentation

## 2022-02-25 DIAGNOSIS — H0011 Chalazion right upper eyelid: Secondary | ICD-10-CM | POA: Insufficient documentation

## 2022-02-25 DIAGNOSIS — Z91040 Latex allergy status: Secondary | ICD-10-CM | POA: Diagnosis not present

## 2022-02-25 DIAGNOSIS — H5711 Ocular pain, right eye: Secondary | ICD-10-CM | POA: Diagnosis present

## 2022-02-25 MED ORDER — FLUORESCEIN SODIUM 1 MG OP STRP
1.0000 | ORAL_STRIP | Freq: Once | OPHTHALMIC | Status: AC
  Administered 2022-02-25: 1 via OPHTHALMIC
  Filled 2022-02-25: qty 1

## 2022-02-25 MED ORDER — ERYTHROMYCIN 5 MG/GM OP OINT: TOPICAL_OINTMENT | OPHTHALMIC | 0 refills | Status: DC

## 2022-02-25 MED ORDER — TETRACAINE HCL 0.5 % OP SOLN
2.0000 [drp] | Freq: Once | OPHTHALMIC | Status: AC
  Administered 2022-02-25: 2 [drp] via OPHTHALMIC
  Filled 2022-02-25: qty 4

## 2022-02-25 NOTE — ED Triage Notes (Signed)
Itchy and irritated right eye for last week. Woke today with swelling and painful, clear drainage  No changes in vision.

## 2022-02-25 NOTE — ED Notes (Signed)
Reviewed AVS/discharge instruction with patient. Time allotted for and all questions answered. Patient is agreeable for d/c and escorted to ed exit by staff.  

## 2022-02-25 NOTE — Discharge Instructions (Addendum)
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  This is basically when the oil glands in your eyelid are blocked.  For this, you will need to use warm compresses for 15 minutes 4 times a day.  I have also given you a referral to ophthalmology with a number to call to schedule an appointment if your symptoms do not improve.  Return if development of any new or worsening symptoms.

## 2022-02-25 NOTE — ED Provider Notes (Signed)
[facility] EMERGENCY DEPT Provider Note   CSN: [id number] Arrival date & time: 02/25/22  1747     History  Chief Complaint  Patient presents with   Eye Problem    Susan English is a 31 y.o. female.  Patient with history of hypertension and obesity presents today with complaints of right eye pain.  She states that same initially began 1 week ago persistent since.  She has not tried anything for her symptoms.  No history of similar previously she does not wear contacts.  She denies any recent exposures that could've resulted in foreign body presence.  She endorses clear drainage and crusting in the morning.  She denies fevers, chills, vision changes, painful eye movements, or headaches.  No known sick contacts.  The history is provided by the patient. No language interpreter was used.  Eye Problem      Home Medications Prior to Admission medications   Medication Sig Start Date End Date Taking? Authorizing Provider  ARIPiprazole (ABILIFY) 20 MG tablet Take 1 tablet (20 mg total) by mouth daily. 05/07/21   Shanna Cisco, NP  atomoxetine (STRATTERA) 40 MG capsule Take 1 capsule (40 mg total) by mouth daily. 02/12/21   Shanna Cisco, NP  benztropine (COGENTIN) 0.5 MG tablet Take 1 tablet (0.5 mg total) by mouth 2 (two) times daily as needed for tremors (EPS). 05/07/21   Shanna Cisco, NP  clindamycin (CLEOCIN T) 1 % SWAB Apply 1 Box topically 2 (two) times daily. 02/09/22   Shelby Mattocks, DO  doxycycline (VIBRA-TABS) 100 MG tablet Take 1 tablet (100 mg total) by mouth 2 (two) times daily. 02/02/22   Shelby Mattocks, DO  FLUoxetine (PROZAC) 20 MG capsule Take 1 capsule (20 mg total) by mouth daily. 05/07/21   Shanna Cisco, NP  gabapentin (NEURONTIN) 300 MG capsule Take 1 capsule (300 mg total) by mouth 3 (three) times daily. 05/07/21   Shanna Cisco, NP  losartan (COZAAR) 25 MG tablet Take 2 tablets (50 mg total) by mouth daily. 01/09/22    Bess Kinds, MD  metroNIDAZOLE (METROGEL VAGINAL) 0.75 % vaginal gel Place 1 Applicatorful vaginally at bedtime. Use for 5 days. 10/08/20   Lilland, Alana, DO  mineral oil-hydrophilic petrolatum (AQUAPHOR) ointment Apply topically as needed for dry skin. Patient not taking: Reported on 03/04/2021 09/13/20   Lilland, Alana, DO  polyethylene glycol powder (GLYCOLAX/MIRALAX) 17 GM/SCOOP powder Take 17 g by mouth 2 (two) times daily as needed. Patient not taking: Reported on 03/04/2021 09/13/20   Lilland, Alana, DO  SUBOXONE 8-2 MG FILM Place 1.5 Film under the tongue 4 (four) times daily. 07/10/20   [provider]  traZODone (DESYREL) 100 MG tablet Take 1 tablet (100 mg total) by mouth at bedtime as needed for sleep. 05/07/21   Shanna Cisco, NP      Allergies    Latex    Review of Systems   Review of Systems  Eyes:  Positive for pain.  All other systems reviewed and are negative.   Physical Exam Updated Vital Signs BP (!) 156/118 (BP Location: Right Arm)   Pulse 82   Temp 98.1 F (36.7 C) (Oral)   Resp 16   SpO2 99%  Physical Exam Vitals and nursing note reviewed.  Constitutional:      General: She is not in acute distress.    Appearance: Normal appearance. She is normal weight. She is not ill-appearing, toxic-appearing or diaphoretic.  HENT:  Head: Normocephalic and atraumatic.  Eyes:     General: Lids are everted, no foreign bodies appreciated. Vision grossly intact.        Right eye: No foreign body.     Intraocular pressure: Right eye pressure is 17 mmHg.     Extraocular Movements: Extraocular movements intact.     Right eye: Normal extraocular motion and no nystagmus.     Conjunctiva/sclera: Conjunctivae normal.     Right eye: Right conjunctiva is not injected. No chemosis, exudate or hemorrhage.    Pupils: Pupils are equal, round, and reactive to light.     Comments: Clear drainage noted to the right eye. Swelling noted to the lateral upper eyelid with  painless rubbery nodule present consistent with chalazion. No conjunctival erythema  No abrasion or ulceration seen with fluorescein stain and Woods lamp evaluation  Cardiovascular:     Rate and Rhythm: Normal rate.  Pulmonary:     Effort: Pulmonary effort is normal. No respiratory distress.  Musculoskeletal:        General: Normal range of motion.     Cervical back: Normal range of motion.  Skin:    General: Skin is warm and dry.  Neurological:     General: No focal deficit present.     Mental Status: She is alert.  Psychiatric:        Mood and Affect: Mood normal.        Behavior: Behavior normal.     ED Results / Procedures / Treatments   Labs (all labs ordered are listed, but only abnormal results are displayed) Labs Reviewed - No data to display  EKG None  Radiology No results found.  Procedures Procedures    Medications Ordered in ED Medications  fluorescein ophthalmic strip 1 strip (1 strip Right Eye Given 02/25/22 1841)  tetracaine (PONTOCAINE) 0.5 % ophthalmic solution 2 drop (2 drops Right Eye Given 02/25/22 1841)    ED Course/ Medical Decision Making/ A&P                           Medical Decision Making Risk Prescription drug management.   Patient presents today with right eye pain x1 week.  She is afebrile, nontoxic-appearing, and in no acute distress with reassuring vital signs.  Physical exam performed without evidence of corneal abrasion or ulceration.  IOP is normal.  She does have swelling to the right lateral upper eyelid with a palpable rubbery nodule consistent with a chalazion.  Exam nonconcerning for orbital cellulitis or infection. No evidence of HSV or VSV infection. Pt is not a contact lens wearer.  Exam non-concerning for hyphema or trauma.  Patient will be discharged home with antibiotic ointment to use if symptoms do not improve or worsen. Also given ophthalmology referral for continued evaluation and management.  Patient has been  instructed to use warm compresses every 15 minutes 4 times a day as well.  Patient is understanding and amenable with plan, educated on red flag symptoms that would prompt immediate return.  Patient discharged in stable condition.      Final Clinical Impression(s) / ED Diagnoses Final diagnoses:  Chalazion of right upper eyelid    Rx / DC Orders ED Discharge Orders          Ordered    erythromycin ophthalmic ointment        02/25/22 1956          An After Visit Summary was printed and given  to the patient.     Vear Clock 02/25/22 1958    Mardene Sayer, MD 02/26/22 [phone]

## 2022-04-13 ENCOUNTER — Other Ambulatory Visit: Payer: Self-pay | Admitting: Student

## 2022-04-13 DIAGNOSIS — L732 Hidradenitis suppurativa: Secondary | ICD-10-CM

## 2023-06-16 ENCOUNTER — Ambulatory Visit: Payer: MEDICAID | Admitting: Family Medicine

## 2023-06-16 VITALS — BP 128/80 | HR 115 | Temp 97.7°F | Ht 68.0 in | Wt 298.8 lb

## 2023-06-16 DIAGNOSIS — J111 Influenza due to unidentified influenza virus with other respiratory manifestations: Secondary | ICD-10-CM

## 2023-06-16 MED ORDER — ONDANSETRON HCL 4 MG PO TABS: 4.0000 mg | ORAL_TABLET | Freq: Three times a day (TID) | ORAL | 0 refills | Status: DC | PRN

## 2023-06-16 NOTE — Patient Instructions (Signed)
 It was great to see you! Thank you for allowing me to participate in your care!  Our plans for today:  - You have a viral illness causing your symptoms. - This will get better in the next several days. - You may use Tylenol  and Ibuprofen  as needed for pain. - Over the counter allergy medicine such as Claritin  and Flonase may help with your congestion. - Cough drops may help with your throat and cough. - If you do not get better in the next 5 days please return to care. - It is important to stay hydrated, and continue eating while sick.    Please arrive 15 minutes PRIOR to your next scheduled appointment time! If you do not, this affects OTHER patients' care.  Take care and seek immediate care sooner if you develop any concerns.   Susan Provencal, MD, PGY-2 [facility] 9:46 AM 06/16/2023  [facility]

## 2023-06-16 NOTE — Progress Notes (Addendum)
    SUBJECTIVE:   CHIEF COMPLAINT / HPI: sick  Daughter diagnosed with Flu and Covid at ED on Jan 30th. Having significant myalgias. Is taking Tylenol  PM is helping. No sore throat. Overall feeling like illness is improving. Able to eat and drink.  Having some nausea. No recent fevers. Had tactile ones original. Having diarrhea, is liquid. First started feeling symptoms on Friday. Had headache. Had cough.  PERTINENT  PMH / PSH: Hx of opioid abuse, Hidradenitis  OBJECTIVE:   BP (!) 144/91   Pulse (!) 115   Temp 97.7 F (36.5 C)   Ht 5' 8 (1.727 m)   Wt 298 lb 12.8 oz (135.5 kg)   SpO2 97%   BMI 45.43 kg/m   General: NAD, well appearing HEENT: MMM, no tonsillar exudates or pharygneal erythema Neuro: A&O Cardiovascular: RRR, no murmurs, no peripheral edema Respiratory: normal WOB on RA, CTAB, no wheezes, ronchi or rales Extremities: Moving all 4 extremities equally   ASSESSMENT/PLAN:   Assessment & Plan Influenza Suspect Flu given close contact with daughter who was positive. Outside of tamilfu window. While mildly tachycardic, otherwise appears hydrated, afebrile, and with normal WOB of breathing. Low concern for pneumonia or other bacterial infection. Counseled on supportive care. Return precautions discussed. -Zofran  4mg  q8h prn   Return if symptoms worsen or fail to improve.  Susan Provencal, MD [facility]

## 2023-11-24 ENCOUNTER — Encounter (HOSPITAL_COMMUNITY): Payer: Self-pay | Admitting: *Deleted

## 2023-11-24 ENCOUNTER — Ambulatory Visit (HOSPITAL_COMMUNITY)
Admission: EM | Admit: 2023-11-24 | Discharge: 2023-11-24 | Disposition: A | Discharge status: Home / Self Care | Payer: MEDICAID | Attending: Internal Medicine | Admitting: Internal Medicine

## 2023-11-24 DIAGNOSIS — I10 Essential (primary) hypertension: Secondary | ICD-10-CM

## 2023-11-24 DIAGNOSIS — S01532A Puncture wound without foreign body of oral cavity, initial encounter: Secondary | ICD-10-CM | POA: Diagnosis not present

## 2023-11-24 DIAGNOSIS — S0993XA Unspecified injury of face, initial encounter: Secondary | ICD-10-CM

## 2023-11-24 MED ORDER — LOSARTAN POTASSIUM 25 MG PO TABS: 50.0000 mg | ORAL_TABLET | Freq: Every day | ORAL | 0 refills | Status: DC

## 2023-11-24 NOTE — ED Provider Notes (Signed)
 [facility]    CSN: [id number] Arrival date & time: 11/24/23  1023      History   Chief Complaint Chief Complaint  Patient presents with   Fall   Lip Laceration    HPI Susan English is a 33 y.o. female presenting today for evaluation of a lip injury that occurred 1 week ago.  She reports falling forward, biting her lip, and hitting a piece of metal.  She has a wound on the inside of her lower lip that she would like to have evaluated and is specifically concerned that it requires suture repair.  The wound stopped bleeding the day of the injury.  She has been rinsing with hydrogen peroxide regularly in the interim.  She has been able to eat and drink with mild discomfort.  Past Medical History:  Diagnosis Date   Gestational diabetes 11/29/2017   Elevated fasting on 2 hour GTT, only one value needs to be elevated to meet criteria.  Patient to see diabetes educator   Headache(784.0)    Hidradenitis suppurativa 03/26/2021   HSIL (high grade squamous intraepithelial lesion) on Pap smear of cervix    Hypertension    Marijuana abuse 08/20/2020   Morbid obesity with BMI of 40.0-44.9, adult (HCC) 01/09/2022   Obesity    Status post cryotherapy of skin lesion 06/11/2017   Cryotherapy of HSIL in June 2018    Patient Active Problem List   Diagnosis Date Noted   Knee pain, bilateral 02/02/2022   Morbid obesity with BMI of 40.0-44.9, adult (HCC) 01/09/2022   Hidradenitis suppurativa 03/26/2021   Abnormal menstrual cycle 10/08/2020   Hypertension 08/23/2020   Schizophrenia spectrum disorder with psychotic disorder type not yet determined (HCC) 08/20/2020   Opiate use 08/20/2020   History of opioid abuse (HCC) 08/12/2020   Encounter for smoking cessation counseling 06/11/2017   History of HSIL (high grade squamous intraepithelial lesion) on Pap smear of cervix 05/13/2016    Past Surgical History:  Procedure Laterality Date   CESAREAN SECTION     ORIF ANKLE FRACTURE Right  12/22/2012   Procedure: OPEN REDUCTION INTERNAL FIXATION (ORIF) ANKLE FRACTURE;  Surgeon: Eva Elsie Herring, MD;  Location: [facility];  Service: Orthopedics;  Laterality: Right;   sweat gland      OB History     Gravida  2   Para  2   Term  2   Preterm      AB      Living  2      SAB      IAB      Ectopic      Multiple  0   Live Births  2            Home Medications    Prior to Admission medications   Medication Sig Start Date End Date Taking? Authorizing Provider  ARIPiprazole  (ABILIFY ) 20 MG tablet Take 1 tablet (20 mg total) by mouth daily. 05/07/21  Yes Harl Regan E, NP  benztropine  (COGENTIN ) 0.5 MG tablet Take 1 tablet (0.5 mg total) by mouth 2 (two) times daily as needed for tremors (EPS). 05/07/21  Yes Harl Regan E, NP  FLUoxetine  (PROZAC ) 20 MG capsule Take 1 capsule (20 mg total) by mouth daily. 05/07/21  Yes Harl Regan E, NP  gabapentin  (NEURONTIN ) 300 MG capsule Take 1 capsule (300 mg total) by mouth 3 (three) times daily. 05/07/21  Yes Harl Regan E, NP  traZODone  (DESYREL ) 100 MG tablet Take  1 tablet (100 mg total) by mouth at bedtime as needed for sleep. 05/07/21  Yes Harl Regan E, NP  atomoxetine  (STRATTERA ) 40 MG capsule Take 1 capsule (40 mg total) by mouth daily. 02/12/21   Harl Regan BRAVO, NP  clindamycin  (CLEOCIN  T) 1 % SWAB APPLY 1 TOPICALLY TWO (TWO) TIMES DAILY 04/14/22   Lilland, Alana, DO  doxycycline  (VIBRA -TABS) 100 MG tablet Take 1 tablet (100 mg total) by mouth 2 (two) times daily. 02/02/22   Orlando Pond, DO  erythromycin  ophthalmic ointment Place a 1/2 inch ribbon of ointment into the lower eyelid. 02/25/22   Smoot, Lauraine LABOR, PA-C  losartan  (COZAAR ) 25 MG tablet Take 2 tablets (50 mg total) by mouth daily. 11/24/23   Melvenia Manus BRAVO, MD  metroNIDAZOLE  (METROGEL  VAGINAL) 0.75 % vaginal gel Place 1 Applicatorful vaginally at bedtime. Use for 5 days. 10/08/20   Lilland, Alana, DO   mineral oil-hydrophilic petrolatum  (AQUAPHOR) ointment Apply topically as needed for dry skin. Patient not taking: Reported on 03/04/2021 09/13/20   Lilland, Alana, DO  ondansetron  (ZOFRAN ) 4 MG tablet Take 1 tablet (4 mg total) by mouth every 8 (eight) hours as needed for nausea or vomiting. 06/16/23   Quillen, Michael, MD  polyethylene glycol powder (GLYCOLAX /MIRALAX ) 17 GM/SCOOP powder Take 17 g by mouth 2 (two) times daily as needed. Patient not taking: Reported on 03/04/2021 09/13/20   Lilland, Alana, DO  SUBOXONE  8-2 MG FILM Place 1.5 Film under the tongue 4 (four) times daily. 07/10/20   [provider]    Family History Family History  Problem Relation Age of Onset   Hypertension Mother    Diabetes Mother    Diabetes Maternal Grandmother    Breast cancer Paternal Grandmother     Social History Social History   Tobacco Use   Smoking status: Every Day    Current packs/day: 0.50    Types: Cigarettes   Smokeless tobacco: Never  Vaping Use   Vaping status: Never Used  Substance Use Topics   Alcohol use: Yes    Comment: occasionally   Drug use: Not Currently     Allergies   Latex   Review of Systems Review of Systems  HENT:         Lower lip injury  All other systems reviewed and are negative.  Physical Exam Triage Vital Signs ED Triage Vitals  Encounter Vitals Group     BP 11/24/23 1051 (!) 162/97     Girls Systolic BP Percentile --      Girls Diastolic BP Percentile --      Boys Systolic BP Percentile --      Boys Diastolic BP Percentile --      Pulse Rate 11/24/23 1051 92     Resp 11/24/23 1051 18     Temp 11/24/23 1051 98.1 F (36.7 C)     Temp Source 11/24/23 1051 Oral     SpO2 11/24/23 1051 96 %     Weight --      Height --      Head Circumference --      Peak Flow --      Pain Score 11/24/23 1052 9     Pain Loc --      Pain Education --      Exclude from Growth Chart --    No data found.  Updated Vital Signs BP (!) 162/97 Comment: has  not taken HTN med x approx 3 days  Pulse 92   Temp 98.1  F (36.7 C) (Oral)   Resp 18   LMP 11/07/2023 (Approximate)   SpO2 96%   Physical Exam Vitals reviewed.  Constitutional:      Appearance: She is obese.  HENT:     Mouth/Throat:     Mouth: Mucous membranes are moist.     Pharynx: No oropharyngeal exudate or posterior oropharyngeal erythema.     Comments: There is a 0.5 x 0.5 cm wound within the oral cavity below the lower lip.  No purulence, active bleeding, or significant erythema is noted. Neurological:     Mental Status: She is alert.    UC Treatments / Results  Labs (all labs ordered are listed, but only abnormal results are displayed) Labs Reviewed - No data to display  EKG   Radiology No results found.  Procedures Procedures (including critical care time)  Medications Ordered in UC Medications - No data to display  Initial Impression / Assessment and Plan / UC Course  I have reviewed the triage vital signs and the nursing notes.  Pertinent labs & imaging results that were available during my care of the patient were reviewed by me and considered in my medical decision making (see chart for details).  Presenting today for evaluation of an oral wound in the setting of recent fall with biting her her lower lip.  On exam there is a 0.5 x 0.5 cm oral lesion within the oral cavity below the lower lip.  Low concern for infection.  No active bleeding.  Does not require suture repair.  Treatment options were reviewed with the patient.  I recommended completing salt water  rinses routinely.  Okay to use Orajel up to 3 times daily as needed for pain relief with eating.  Recommend returning to care with her PCP or urgent care if the wound starts to bleed, pain increases, or she is concerned for infection.   Final Clinical Impressions(s) / UC Diagnoses   Final diagnoses:  Lip injury, initial encounter  Puncture wound of oral cavity without foreign body, initial  encounter     Discharge Instructions      I expect your lip lesion to fully heal. As we discussed, I recommend salt water  rinses routinely to prevent infection. Ok to use orajel 3 times daily for pain relief. Return to care with your primary care doctor or urgent care if pain worsens or you are concerned for infection.    ED Prescriptions     Medication Sig Dispense Auth. Provider   losartan  (COZAAR ) 25 MG tablet Take 2 tablets (50 mg total) by mouth daily. 60 tablet Melvenia Manus BRAVO, MD      PDMP not reviewed this encounter.   Melvenia Manus BRAVO, MD 11/24/23 [phone]

## 2023-11-24 NOTE — Discharge Instructions (Addendum)
 I expect your lip lesion to fully heal. As we discussed, I recommend salt water  rinses routinely to prevent infection. Ok to use orajel 3 times daily for pain relief. Return to care with your primary care doctor or urgent care if pain worsens or you are concerned for infection.

## 2023-11-24 NOTE — ED Triage Notes (Signed)
 C/O fall 1 wk ago -- states tripped and fell, hitting mouth on some metal, sustaining laceration to left inner and outer lower lip. States has been cleaning out inner lip with H2O2, but now there is a hole. Denies any purulent drainage or fevers.

## 2024-04-02 ENCOUNTER — Ambulatory Visit (HOSPITAL_COMMUNITY)
Admission: EM | Admit: 2024-04-02 | Discharge: 2024-04-03 | Disposition: A | Discharge status: Other Acute Inpatient Hospital | Payer: MEDICAID | Attending: Psychiatry | Admitting: Psychiatry

## 2024-04-02 DIAGNOSIS — F419 Anxiety disorder, unspecified: Secondary | ICD-10-CM | POA: Insufficient documentation

## 2024-04-02 DIAGNOSIS — I10 Essential (primary) hypertension: Secondary | ICD-10-CM | POA: Insufficient documentation

## 2024-04-02 DIAGNOSIS — F209 Schizophrenia, unspecified: Secondary | ICD-10-CM | POA: Diagnosis not present

## 2024-04-02 DIAGNOSIS — F32A Depression, unspecified: Secondary | ICD-10-CM | POA: Insufficient documentation

## 2024-04-02 DIAGNOSIS — R451 Restlessness and agitation: Secondary | ICD-10-CM

## 2024-04-02 DIAGNOSIS — F1210 Cannabis abuse, uncomplicated: Secondary | ICD-10-CM | POA: Insufficient documentation

## 2024-04-02 DIAGNOSIS — Y9222 Religious institution as the place of occurrence of the external cause: Secondary | ICD-10-CM | POA: Insufficient documentation

## 2024-04-02 LAB — POCT URINE DRUG SCREEN - MANUAL ENTRY (I-SCREEN)
POC Amphetamine UR: POSITIVE — AB
POC Buprenorphine (BUP): NOT DETECTED
POC Cocaine UR: NOT DETECTED
POC Marijuana UR: POSITIVE — AB
POC Methadone UR: NOT DETECTED
POC Methamphetamine UR: NOT DETECTED
POC Morphine: POSITIVE — AB
POC Oxazepam (BZO): NOT DETECTED
POC Oxycodone UR: NOT DETECTED
POC Secobarbital (BAR): NOT DETECTED

## 2024-04-02 LAB — COMPREHENSIVE METABOLIC PANEL WITH GFR
ALT: 16 U/L (ref 0–44)
AST: 26 U/L (ref 15–41)
Albumin: 3 g/dL — ABNORMAL LOW (ref 3.5–5.0)
Alkaline Phosphatase: 92 U/L (ref 38–126)
Anion gap: 10 (ref 5–15)
BUN: 8 mg/dL (ref 6–20)
CO2: 22 mmol/L (ref 22–32)
Calcium: 8.5 mg/dL — ABNORMAL LOW (ref 8.9–10.3)
Chloride: 105 mmol/L (ref 98–111)
Creatinine, Ser: 0.8 mg/dL (ref 0.44–1.00)
GFR, Estimated: >60 mL/min (ref >60)
Glucose, Bld: 140 mg/dL — ABNORMAL HIGH (ref 70–99)
Potassium: 3.4 mmol/L — ABNORMAL LOW (ref 3.5–5.1)
Sodium: 137 mmol/L (ref 135–145)
Total Bilirubin: 0.7 mg/dL (ref 0.0–1.2)
Total Protein: 7.1 g/dL (ref 6.5–8.1)

## 2024-04-02 LAB — CBC WITH DIFFERENTIAL/PLATELET
Abs Immature Granulocytes: 0.05 K/uL (ref 0.00–0.07)
Basophils Absolute: 0.1 K/uL (ref 0.0–0.1)
Basophils Relative: 1 %
Eosinophils Absolute: 0.1 K/uL (ref 0.0–0.5)
Eosinophils Relative: 1 %
HCT: 37.8 % (ref 36.0–46.0)
Hemoglobin: 12.3 g/dL (ref 12.0–15.0)
Immature Granulocytes: 0 %
Lymphocytes Relative: 25 %
Lymphs Abs: 3.3 K/uL (ref 0.7–4.0)
MCH: 26.9 pg (ref 26.0–34.0)
MCHC: 32.5 g/dL (ref 30.0–36.0)
MCV: 82.5 fL (ref 80.0–100.0)
Monocytes Absolute: 1.7 K/uL — ABNORMAL HIGH (ref 0.1–1.0)
Monocytes Relative: 13 %
Neutro Abs: 8 K/uL — ABNORMAL HIGH (ref 1.7–7.7)
Neutrophils Relative %: 60 %
Platelets: 312 K/uL (ref 150–400)
RBC: 4.58 MIL/uL (ref 3.87–5.11)
RDW: 14.7 % (ref 11.5–15.5)
WBC: 13.2 K/uL — ABNORMAL HIGH (ref 4.0–10.5)
nRBC: 0 % (ref 0.0–0.2)

## 2024-04-02 LAB — HEMOGLOBIN A1C
Hgb A1c MFr Bld: 5.4 % (ref 4.8–5.6)
Mean Plasma Glucose: 108.28 mg/dL

## 2024-04-02 LAB — URINALYSIS, ROUTINE W REFLEX MICROSCOPIC
Bilirubin Urine: NEGATIVE
Glucose, UA: NEGATIVE mg/dL
Ketones, ur: NEGATIVE mg/dL
Leukocytes,Ua: NEGATIVE
Nitrite: NEGATIVE
Protein, ur: 100 mg/dL — AB
Specific Gravity, Urine: 1.023 (ref 1.005–1.030)
pH: 5 (ref 5.0–8.0)

## 2024-04-02 LAB — LIPID PANEL
Cholesterol: 155 mg/dL (ref 0–200)
HDL: 41 mg/dL (ref >40)
LDL Cholesterol: 104 mg/dL — ABNORMAL HIGH (ref 0–99)
Total CHOL/HDL Ratio: 3.8 ratio
Triglycerides: 51 mg/dL (ref <150)
VLDL: 10 mg/dL (ref 0–40)

## 2024-04-02 LAB — TSH: TSH: 3.165 u[IU]/mL (ref 0.350–4.500)

## 2024-04-02 LAB — ETHANOL: Alcohol, Ethyl (B): <15 mg/dL (ref <15)

## 2024-04-02 MED ORDER — TRAZODONE HCL 50 MG PO TABS
50.0000 mg | ORAL_TABLET | Freq: Every evening | ORAL | Status: DC | PRN
  Administered 2024-04-02: 50 mg via ORAL
  Filled 2024-04-02: qty 1

## 2024-04-02 MED ORDER — OLANZAPINE 10 MG PO TABS
10.0000 mg | ORAL_TABLET | Freq: Every day | ORAL | Status: DC
  Administered 2024-04-02: 10 mg via ORAL
  Filled 2024-04-02: qty 1

## 2024-04-02 MED ORDER — HALOPERIDOL LACTATE 5 MG/ML IJ SOLN
5.0000 mg | Freq: Three times a day (TID) | INTRAMUSCULAR | Status: DC | PRN
  Administered 2024-04-02: 5 mg via INTRAMUSCULAR
  Filled 2024-04-02: qty 1

## 2024-04-02 MED ORDER — DIPHENHYDRAMINE HCL 50 MG PO CAPS: 50.0000 mg | ORAL_CAPSULE | Freq: Three times a day (TID) | ORAL | Status: DC | PRN

## 2024-04-02 MED ORDER — LORAZEPAM 2 MG/ML IJ SOLN
2.0000 mg | Freq: Three times a day (TID) | INTRAMUSCULAR | Status: DC | PRN
  Administered 2024-04-02: 2 mg via INTRAMUSCULAR

## 2024-04-02 MED ORDER — LORAZEPAM 2 MG/ML IJ SOLN
2.0000 mg | Freq: Three times a day (TID) | INTRAMUSCULAR | Status: DC | PRN
  Filled 2024-04-02: qty 1

## 2024-04-02 MED ORDER — DIPHENHYDRAMINE HCL 50 MG/ML IJ SOLN
50.0000 mg | Freq: Three times a day (TID) | INTRAMUSCULAR | Status: DC | PRN
  Filled 2024-04-02: qty 1

## 2024-04-02 MED ORDER — GABAPENTIN 100 MG PO CAPS
100.0000 mg | ORAL_CAPSULE | Freq: Three times a day (TID) | ORAL | Status: DC
  Administered 2024-04-03: 100 mg via ORAL
  Filled 2024-04-02: qty 1

## 2024-04-02 MED ORDER — LOSARTAN POTASSIUM 50 MG PO TABS
25.0000 mg | ORAL_TABLET | Freq: Every day | ORAL | Status: DC
  Administered 2024-04-02 – 2024-04-03 (×2): 25 mg via ORAL
  Filled 2024-04-02 (×2): qty 1

## 2024-04-02 MED ORDER — HYDROXYZINE HCL 25 MG PO TABS
25.0000 mg | ORAL_TABLET | Freq: Three times a day (TID) | ORAL | Status: DC | PRN
  Administered 2024-04-02: 25 mg via ORAL
  Filled 2024-04-02: qty 1

## 2024-04-02 MED ORDER — ALUM & MAG HYDROXIDE-SIMETH 200-200-20 MG/5ML PO SUSP: 30.0000 mL | ORAL | Status: DC | PRN

## 2024-04-02 MED ORDER — HALOPERIDOL LACTATE 5 MG/ML IJ SOLN: 10.0000 mg | Freq: Three times a day (TID) | INTRAMUSCULAR | Status: DC | PRN

## 2024-04-02 MED ORDER — HALOPERIDOL 5 MG PO TABS: 5.0000 mg | ORAL_TABLET | Freq: Three times a day (TID) | ORAL | Status: DC | PRN

## 2024-04-02 MED ORDER — MAGNESIUM HYDROXIDE 400 MG/5ML PO SUSP: 30.0000 mL | Freq: Every day | ORAL | Status: DC | PRN

## 2024-04-02 MED ORDER — ACETAMINOPHEN 325 MG PO TABS: 650.0000 mg | ORAL_TABLET | Freq: Four times a day (QID) | ORAL | Status: DC | PRN

## 2024-04-02 MED ORDER — DIPHENHYDRAMINE HCL 50 MG/ML IJ SOLN
50.0000 mg | Freq: Three times a day (TID) | INTRAMUSCULAR | Status: DC | PRN
  Administered 2024-04-02: 50 mg via INTRAMUSCULAR

## 2024-04-02 NOTE — Progress Notes (Addendum)
 Pt is asleep. Respirations are even and unlabored. No signs of acute distress noted. Staff will monitor for pt's safety.

## 2024-04-02 NOTE — Discharge Instructions (Addendum)
 Patient to be transferred to [facility] for inpatient treatment.

## 2024-04-02 NOTE — ED Notes (Signed)
 Pt anxious, denies SI/ HI/AVH at present time. Supportive listening provided. Pt agrees to labs and UDS. She is calm and cooperative. States  I just needed some changes, I want to do better / I  want to do better for my kids/  No noted distress. Will continue to monitor for safety.

## 2024-04-02 NOTE — ED Notes (Signed)
Pt observed/assessed in room sleeping. RR even and unlabored, appearing in no noted distress. Environmental check complete, will continue to monitor for safety 
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
## 2024-04-02 NOTE — ED Notes (Signed)
 Pt uncomfortable in current recliner bed. Moved to a larger bed. Vistaril  provided for anxiety. Pt is cooperative

## 2024-04-02 NOTE — Progress Notes (Addendum)
 Pt is IVCed, is psychotic and was agitated on arrival to the facility. Pt was demanding to be discharged and was not able to redirect or de-escalate. Pt was offered medications which she refused and threatened to fight if staff tries. Pt was loud, and at some was point was making barking sound. Multiple attempts were made to de-escalate pt to no avail. PRN  Lorazepam  2mg , Haldol  5mg  and Benadryl   50mg  were administered on pt's left anterior thigh at 1430. Due to pt resisting, manual hold was initiated from 1430-1432. Pt was kicking and resisting through the whole process. Pt was became tearful afterwards and was encouraged to change into scrubs. Pt was then escorted to to the unit. Fluid and food offered but pt declined. Staff will monitor for pt's safety.

## 2024-04-02 NOTE — Progress Notes (Signed)
 Unable to obtain labs due to pt's aggressive behavior.
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
## 2024-04-02 NOTE — Progress Notes (Signed)
 Patient has been denied by [facility] due to no appropriate beds available. Patient meets BH inpatient criteria per Alan Mcardle, NP. Patient has been faxed out to the following facilities:   [facility]  [address] [place] [place] [address] [phone]  [facility]  [address] [place] [address] [phone]  [facility] EFAX  [address] [place], [place] [phone]  [facility]  [address] [place] [address] [phone]  [facility]  [address] [place] [address] [phone]  [facility]  [address] [place], [place] [address] [phone]  [facility] Patient Placement  [facility] [place] [phone]  [facility]  [address] [place] [address] [phone]  [facility]  [address], [place] [address] [phone]  [facility]  [address] [place] [address] [phone]  [facility]  [address] [place] [address] [phone]  [facility]  [address], [place] [address] [phone]  [facility]  [address], [place] [address] [phone]  [facility]  [address], [place] [address] [phone]  [facility]  [address] [place] [address] [phone]  [facility]  [address], [place] [address] [phone]  [facility]  [address] [place], [place]  [address] [phone]   Bunnie Gallop, MSW, LCSW-A  3:41 PM 04/02/2024
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
## 2024-04-02 NOTE — Progress Notes (Addendum)
 [facility] LCSW Progress Note   04/02/2024    4:04 PM  Susan English   [id number]   Type of Contact and Topic:  Psychiatric Bed Placement   Pt accepted to [facility]     Patient meets inpatient criteria per Alan Mcardle, NP    The attending provider will be Dr. Odella Jumper  Call report to [phone]  Milinda Abdul-Mutakallim, RN @[email or web address]  notified.     Pt scheduled  to arrive at [facility] for tomorrow (11/24).    Bunnie Gallop, MSW, LCSW-A  4:05 PM 04/02/2024

## 2024-04-03 LAB — POCT PREGNANCY, URINE: Preg Test, Ur: NEGATIVE

## 2024-04-03 NOTE — ED Notes (Signed)
 Pt observed/assessed in recliner sleeping. RR even and unlabored, appearing in no noted distress. Environmental check complete, will continue to monitor for safety
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
## 2024-06-15 ENCOUNTER — Ambulatory Visit: Payer: MEDICAID | Admitting: Family Medicine

## 2024-06-15 ENCOUNTER — Encounter: Payer: Self-pay | Admitting: Family Medicine

## 2024-06-15 VITALS — BP 152/100 | HR 80 | Ht 69.0 in | Wt 322.8 lb

## 2024-06-15 DIAGNOSIS — I10 Essential (primary) hypertension: Secondary | ICD-10-CM

## 2024-06-15 DIAGNOSIS — F5101 Primary insomnia: Secondary | ICD-10-CM

## 2024-06-15 DIAGNOSIS — F411 Generalized anxiety disorder: Secondary | ICD-10-CM

## 2024-06-15 MED ORDER — AMLODIPINE BESYLATE 5 MG PO TABS: 5.0000 mg | ORAL_TABLET | Freq: Every day | ORAL | 3 refills | Status: AC

## 2024-06-15 MED ORDER — GABAPENTIN 100 MG PO CAPS: 100.0000 mg | ORAL_CAPSULE | Freq: Three times a day (TID) | ORAL | Status: AC

## 2024-06-15 MED ORDER — MAGNESIUM GLUCONATE 550 MG PO TABS: 1.0000 | ORAL_TABLET | Freq: Every day | ORAL | 0 refills | Status: AC

## 2024-06-15 MED ORDER — LOSARTAN POTASSIUM 25 MG PO TABS: 50.0000 mg | ORAL_TABLET | Freq: Every day | ORAL | 2 refills | Status: AC

## 2024-06-15 MED ORDER — MELATONIN 3 MG PO CAPS: 3.0000 mg | ORAL_CAPSULE | Freq: Every day | ORAL | 0 refills | Status: AC

## 2024-06-15 NOTE — Progress Notes (Signed)
" ° °  SUBJECTIVE:   CHIEF COMPLAINT / HPI:  CERENITI CURB is a 34 y.o. female with a pertinent past medical history of HTN, schizophrenia, GAD presenting to the clinic for medication refills.  Schizophrenia, GAD Patient stayed in the ED on 04/02/2024 for acute psychosis, was briefly at [facility]. Was transferred to [facility] and stayed there until December 2nd. Patient discharged on magnesium  gluconate, melatonin, trazodone , and risperidone . Patient is doing well since discharge. Denies manic symptoms, has been sleeping at least 7 hours nightly, feels well rested and states her mood is stable and she is doing well. Denies A/V hallucinations. Denies SI/HI. Did call son for collateral, patient has been getting medications filled via mail order, is seeing behavioral health outpatient provider.  Hypertension - BP: (!) 154/102 today. - Home medications include: Losartan  50 mg  - Last took yesterday morning - Denies hypotension symptoms including dizziness and orthostatic symptoms. - Denies headache, vision changes, LUQ pain, hematuria, chest pain.  Most recent creatinine trend:  Lab Results  Component Value Date   CREATININE 0.80 04/02/2024   CREATININE 0.67 01/09/2022   CREATININE 0.61 08/24/2020     PERTINENT PMH / PSH: Schizophrenia, GAD HS HTN  *Remainder reviewed in problem list.   OBJECTIVE:   BP (!) 154/102   Pulse 80   Ht 5' 9 (1.753 m)   Wt (!) 322 lb 12.8 oz (146.4 kg)   LMP 05/18/2024   SpO2 96%   BMI 47.67 kg/m   General: Age-appropriate, resting comfortably in chair, NAD, alert and at baseline. Cardiovascular: Regular rate and rhythm. Normal S1/S2. No murmurs, rubs, or gallops appreciated. 2+ radial pulses. Pulmonary: Clear bilaterally to ascultation. No wheezes, crackles, or rhonchi. Normal WOB on room air. No accessory muscle use. Extremities: No peripheral edema bilaterally. Capillary refill <2  seconds. Psych: Pleasant, appropriate.  Full range affect.  Organized thought process.  Denies A/P hallucinations.  Denies SI/HI.   ASSESSMENT/PLAN:   Assessment & Plan Primary hypertension Poorly controlled today and chronically based on chart review. - Continue losartan  50 mg - Start amlodipine  5 mg Primary insomnia Fairly well-controlled on trazodone , magnesium  gluconate, melatonin. - Filled melatonin and magnesium  OTC per patient request Generalized anxiety disorder Well-controlled per report. - Refilled gabapentin  100 mg 3 times daily per patient request  Follow-up in 1 month for BP.  Kodie Kishi Toma, MD [facility] "

## 2024-06-15 NOTE — Patient Instructions (Addendum)
 It was great to see you today! Thank you for choosing [facility] for your primary care. Today we addressed: 1. Primary insomnia I have refilled your melatonin and your magnesium .  1. Primary hypertension I am starting you on amlodipine  for blood pressure.  Please makes you are taking your losartan  2 pills once daily.  If you had a referral placed, they will call you to set up an appointment. Please give us  a call if you don't hear back in the next 2 weeks.  You should return to our clinic in 3 months for check in.  Thank you for coming to see us  at [facility] and for the opportunity to care for you! Toma, Ysabel Stankovich, MD 06/15/2024, 11:45 AM    Medicaid Dental List  Dorise Rouleau DDS     [phone]  Clancy Hammersmith, DDS (Spanish speaking)  [address]  [place]  [address]  Se habla espaol  From 43 to 36 years old  Parent may go with child   [facility]  [phone]  [address]  [place] [address]  Se habla espanol  Interpretation for other languages  Special needs children welcome   [facility]   [phone]  Dr. Leita Lust  [address]  [place], [place] [address]  Se habla espaol  From birth to 12 years  Special needs children welcome     Abran Kenner DDS    [phone]  [address]  [place] [address]  Se habla espaol  From 18 months to 55 years old  Parent may go with child   [facility]ki [facility] Silva DDS  [address] [place]   [phone]  Need to bring interpreter   [facility]  [address]  [place] [address]   [phone]  Has interpreters in common languages    [facility]   [address]  [place], [place] [address]  Phone: [phone]   Health Department  (two locations)    [facility]: [facility] at [address] [place], [place], [place] [address].For appointments and more information, call [phone].   [facility]: [address], [place], [place] [address]. For appointments and more information, call [phone].

## 2024-06-15 NOTE — Assessment & Plan Note (Signed)
 Poorly controlled today and chronically based on chart review. - Continue losartan  50 mg - Start amlodipine  5 mg

## 2024-07-14 ENCOUNTER — Ambulatory Visit: Payer: Self-pay | Admitting: Family Medicine
